# Patient Record
Sex: Female | Born: 1940 | Race: White | Hispanic: No | Smoking: Former smoker
Health system: Southern US, Community
[De-identification: ages and names within clinical notes are randomized; demographics above are authoritative.]

## PROBLEM LIST (undated history)

## (undated) DIAGNOSIS — M797 Fibromyalgia: Secondary | ICD-10-CM

## (undated) DIAGNOSIS — M1990 Unspecified osteoarthritis, unspecified site: Secondary | ICD-10-CM

## (undated) DIAGNOSIS — R519 Headache, unspecified: Secondary | ICD-10-CM

## (undated) DIAGNOSIS — Z9889 Other specified postprocedural states: Secondary | ICD-10-CM

## (undated) DIAGNOSIS — Z90710 Acquired absence of both cervix and uterus: Secondary | ICD-10-CM

## (undated) DIAGNOSIS — I2510 Atherosclerotic heart disease of native coronary artery without angina pectoris: Secondary | ICD-10-CM

## (undated) DIAGNOSIS — M719 Bursopathy, unspecified: Secondary | ICD-10-CM

## (undated) DIAGNOSIS — R112 Nausea with vomiting, unspecified: Secondary | ICD-10-CM

## (undated) DIAGNOSIS — R51 Headache: Secondary | ICD-10-CM

## (undated) DIAGNOSIS — I10 Essential (primary) hypertension: Secondary | ICD-10-CM

## (undated) DIAGNOSIS — J189 Pneumonia, unspecified organism: Secondary | ICD-10-CM

## (undated) DIAGNOSIS — E785 Hyperlipidemia, unspecified: Secondary | ICD-10-CM

## (undated) DIAGNOSIS — Z9289 Personal history of other medical treatment: Secondary | ICD-10-CM

## (undated) DIAGNOSIS — R42 Dizziness and giddiness: Secondary | ICD-10-CM

## (undated) DIAGNOSIS — M069 Rheumatoid arthritis, unspecified: Secondary | ICD-10-CM

## (undated) DIAGNOSIS — I219 Acute myocardial infarction, unspecified: Secondary | ICD-10-CM

## (undated) DIAGNOSIS — J45909 Unspecified asthma, uncomplicated: Secondary | ICD-10-CM

## (undated) HISTORY — PX: CORONARY ANGIOPLASTY: SHX604

## (undated) HISTORY — DX: Atherosclerotic heart disease of native coronary artery without angina pectoris: I25.10

## (undated) HISTORY — PX: ANGIOPLASTY: SHX39

## (undated) HISTORY — PX: EYE SURGERY: SHX253

## (undated) HISTORY — DX: Rheumatoid arthritis, unspecified: M06.9

## (undated) HISTORY — PX: BREAST SURGERY: SHX581

## (undated) HISTORY — DX: Hyperlipidemia, unspecified: E78.5

## (undated) HISTORY — DX: Essential (primary) hypertension: I10

## (undated) HISTORY — PX: HERNIA REPAIR: SHX51

## (undated) HISTORY — DX: Personal history of other medical treatment: Z92.89

## (undated) HISTORY — PX: OTHER SURGICAL HISTORY: SHX169

## (undated) HISTORY — PX: JOINT REPLACEMENT: SHX530

## (undated) HISTORY — PX: ABDOMINAL HYSTERECTOMY: SHX81

## (undated) HISTORY — DX: Acquired absence of both cervix and uterus: Z90.710

## (undated) HISTORY — PX: TONSILLECTOMY: SUR1361

---

## 1999-08-18 ENCOUNTER — Ambulatory Visit (HOSPITAL_COMMUNITY): Admission: RE | Admit: 1999-08-18 | Discharge: 1999-08-18 | Payer: Self-pay | Admitting: Internal Medicine

## 1999-08-18 ENCOUNTER — Encounter: Payer: Self-pay | Admitting: Internal Medicine

## 1999-10-07 ENCOUNTER — Encounter: Payer: Self-pay | Admitting: Gynecology

## 1999-10-07 ENCOUNTER — Encounter: Admission: RE | Admit: 1999-10-07 | Discharge: 1999-10-07 | Payer: Self-pay | Admitting: Gynecology

## 1999-10-13 ENCOUNTER — Encounter: Admission: RE | Admit: 1999-10-13 | Discharge: 1999-10-13 | Payer: Self-pay | Admitting: Gynecology

## 1999-10-13 ENCOUNTER — Encounter: Payer: Self-pay | Admitting: Gynecology

## 2000-09-27 ENCOUNTER — Ambulatory Visit (HOSPITAL_COMMUNITY): Admission: RE | Admit: 2000-09-27 | Discharge: 2000-09-27 | Payer: Self-pay | Admitting: Family Medicine

## 2000-09-27 ENCOUNTER — Encounter: Payer: Self-pay | Admitting: Family Medicine

## 2000-12-18 ENCOUNTER — Encounter: Admission: RE | Admit: 2000-12-18 | Discharge: 2000-12-18 | Payer: Self-pay

## 2000-12-20 ENCOUNTER — Encounter: Payer: Self-pay | Admitting: Gynecology

## 2000-12-20 ENCOUNTER — Encounter: Admission: RE | Admit: 2000-12-20 | Discharge: 2000-12-20 | Payer: Self-pay | Admitting: Gynecology

## 2002-02-11 ENCOUNTER — Encounter: Admission: RE | Admit: 2002-02-11 | Discharge: 2002-02-11 | Payer: Self-pay | Admitting: Gynecology

## 2002-02-11 ENCOUNTER — Encounter: Payer: Self-pay | Admitting: Gynecology

## 2002-02-20 ENCOUNTER — Other Ambulatory Visit: Admission: RE | Admit: 2002-02-20 | Discharge: 2002-02-20 | Payer: Self-pay | Admitting: Gynecology

## 2002-09-12 ENCOUNTER — Encounter: Payer: Self-pay | Admitting: Family Medicine

## 2002-09-12 ENCOUNTER — Ambulatory Visit (HOSPITAL_COMMUNITY): Admission: RE | Admit: 2002-09-12 | Discharge: 2002-09-12 | Payer: Self-pay | Admitting: Family Medicine

## 2003-03-20 ENCOUNTER — Encounter: Payer: Self-pay | Admitting: Gynecology

## 2003-03-20 ENCOUNTER — Encounter: Admission: RE | Admit: 2003-03-20 | Discharge: 2003-03-20 | Payer: Self-pay | Admitting: Gynecology

## 2003-03-23 ENCOUNTER — Other Ambulatory Visit: Admission: RE | Admit: 2003-03-23 | Discharge: 2003-03-23 | Payer: Self-pay | Admitting: Gynecology

## 2004-02-16 ENCOUNTER — Encounter: Admission: RE | Admit: 2004-02-16 | Discharge: 2004-02-16 | Payer: Self-pay

## 2004-04-25 ENCOUNTER — Encounter: Admission: RE | Admit: 2004-04-25 | Discharge: 2004-04-25 | Payer: Self-pay | Admitting: Gynecology

## 2004-05-30 ENCOUNTER — Other Ambulatory Visit: Admission: RE | Admit: 2004-05-30 | Discharge: 2004-05-30 | Payer: Self-pay | Admitting: Gynecology

## 2005-02-23 ENCOUNTER — Encounter: Payer: Self-pay | Admitting: Cardiovascular Disease

## 2005-02-23 ENCOUNTER — Encounter (HOSPITAL_COMMUNITY): Admission: RE | Admit: 2005-02-23 | Discharge: 2005-05-24 | Payer: Self-pay | Admitting: Cardiology

## 2005-05-25 ENCOUNTER — Encounter: Admission: RE | Admit: 2005-05-25 | Discharge: 2005-05-25 | Payer: Self-pay | Admitting: Gynecology

## 2005-06-01 ENCOUNTER — Ambulatory Visit: Payer: Self-pay | Admitting: Physical Medicine & Rehabilitation

## 2005-06-01 ENCOUNTER — Other Ambulatory Visit: Admission: RE | Admit: 2005-06-01 | Discharge: 2005-06-01 | Payer: Self-pay | Admitting: Gynecology

## 2005-06-01 ENCOUNTER — Inpatient Hospital Stay (HOSPITAL_COMMUNITY): Admission: EM | Admit: 2005-06-01 | Discharge: 2005-06-19 | Payer: Self-pay | Admitting: Emergency Medicine

## 2005-06-19 ENCOUNTER — Inpatient Hospital Stay (HOSPITAL_COMMUNITY)
Admission: RE | Admit: 2005-06-19 | Discharge: 2005-07-06 | Payer: Self-pay | Admitting: Physical Medicine & Rehabilitation

## 2005-06-19 ENCOUNTER — Ambulatory Visit: Payer: Self-pay | Admitting: Physical Medicine & Rehabilitation

## 2006-02-19 ENCOUNTER — Encounter: Admission: RE | Admit: 2006-02-19 | Discharge: 2006-02-19 | Payer: Self-pay | Admitting: Orthopedic Surgery

## 2006-03-13 ENCOUNTER — Ambulatory Visit (HOSPITAL_COMMUNITY): Admission: RE | Admit: 2006-03-13 | Discharge: 2006-03-13 | Payer: Self-pay | Admitting: Orthopedic Surgery

## 2006-05-31 ENCOUNTER — Encounter: Admission: RE | Admit: 2006-05-31 | Discharge: 2006-05-31 | Payer: Self-pay | Admitting: Gynecology

## 2006-06-07 ENCOUNTER — Other Ambulatory Visit: Admission: RE | Admit: 2006-06-07 | Discharge: 2006-06-07 | Payer: Self-pay | Admitting: Gynecology

## 2006-09-26 ENCOUNTER — Encounter: Admission: RE | Admit: 2006-09-26 | Discharge: 2006-12-25 | Payer: Self-pay | Admitting: Family Medicine

## 2006-11-22 ENCOUNTER — Other Ambulatory Visit: Admission: RE | Admit: 2006-11-22 | Discharge: 2006-11-22 | Payer: Self-pay | Admitting: Gynecology

## 2007-06-03 ENCOUNTER — Encounter: Admission: RE | Admit: 2007-06-03 | Discharge: 2007-06-03 | Payer: Self-pay | Admitting: Gynecology

## 2007-06-11 ENCOUNTER — Other Ambulatory Visit: Admission: RE | Admit: 2007-06-11 | Discharge: 2007-06-11 | Payer: Self-pay | Admitting: Gynecology

## 2007-12-30 ENCOUNTER — Encounter: Payer: Self-pay | Admitting: Cardiovascular Disease

## 2008-06-24 ENCOUNTER — Encounter: Admission: RE | Admit: 2008-06-24 | Discharge: 2008-06-24 | Payer: Self-pay | Admitting: Gynecology

## 2008-09-08 ENCOUNTER — Encounter: Admission: RE | Admit: 2008-09-08 | Discharge: 2008-09-08 | Payer: Self-pay | Admitting: Otolaryngology

## 2009-01-21 ENCOUNTER — Encounter: Payer: Self-pay | Admitting: Cardiovascular Disease

## 2009-06-09 ENCOUNTER — Encounter: Payer: Self-pay | Admitting: Cardiovascular Disease

## 2009-06-28 ENCOUNTER — Encounter: Admission: RE | Admit: 2009-06-28 | Discharge: 2009-06-28 | Payer: Self-pay | Admitting: Gynecology

## 2009-12-16 ENCOUNTER — Encounter: Payer: Self-pay | Admitting: Cardiovascular Disease

## 2009-12-29 ENCOUNTER — Encounter: Payer: Self-pay | Admitting: Cardiovascular Disease

## 2010-06-16 ENCOUNTER — Ambulatory Visit: Payer: Self-pay | Admitting: Cardiovascular Disease

## 2010-06-16 DIAGNOSIS — I252 Old myocardial infarction: Secondary | ICD-10-CM | POA: Insufficient documentation

## 2010-06-16 DIAGNOSIS — E7800 Pure hypercholesterolemia, unspecified: Secondary | ICD-10-CM | POA: Insufficient documentation

## 2010-06-24 ENCOUNTER — Encounter: Payer: Self-pay | Admitting: Cardiovascular Disease

## 2010-07-01 ENCOUNTER — Encounter: Payer: Self-pay | Admitting: Cardiovascular Disease

## 2010-07-04 ENCOUNTER — Encounter: Payer: Self-pay | Admitting: Cardiovascular Disease

## 2010-07-27 ENCOUNTER — Encounter: Admission: RE | Admit: 2010-07-27 | Discharge: 2010-07-27 | Payer: Self-pay | Admitting: Gynecology

## 2010-10-20 NOTE — Progress Notes (Signed)
Summary: [facility] Office Visit Note   [facility] Office Visit Note   Imported By: Roderic Ovens 07/26/2010 10:16:59  _____________________________________________________________________  External Attachment:    Type:   Image     Comment:   External Document

## 2010-10-20 NOTE — Progress Notes (Signed)
Summary: [facility] Office Visit Note   [facility] Office Visit Note   Imported By: Roderic Ovens 07/26/2010 10:21:23  _____________________________________________________________________  External Attachment:    Type:   Image     Comment:   External Document

## 2010-10-20 NOTE — Letter (Signed)
Summary: [facility] Images, Exercise Stress Test   [facility] Images, Exercise Stress Test   Imported By: Roderic Ovens 07/26/2010 10:33:09  _____________________________________________________________________  External Attachment:    Type:   Image     Comment:   External Document

## 2010-10-20 NOTE — Letter (Signed)
Summary: [facility] Extended Visit Note   [facility] Extended Visit Note   Imported By: Roderic Ovens 07/29/2010 16:12:13  _____________________________________________________________________  External Attachment:    Type:   Image     Comment:   External Document
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
## 2010-10-20 NOTE — Progress Notes (Signed)
Summary: [facility] Office Visit Note   [facility] Office Visit Note   Imported By: Roderic Ovens 07/26/2010 10:20:29  _____________________________________________________________________  External Attachment:    Type:   Image     Comment:   External Document

## 2010-11-24 IMAGING — MG MM DIGITAL SCREENING
4 series · 4 of 4 positions shown · non-contrast
Comparison: none

DG SCREEN MAMMOGRAM BILATERAL
Bilateral CC and MLO view(s) were taken.
Technologist: Klever Jumper

DIGITAL SCREENING MAMMOGRAM WITH CAD:
The breast tissue is almost entirely fatty.  No masses or malignant type calcifications are 
identified.  Compared with prior studies.
Images were processed with CAD.

[R CC]
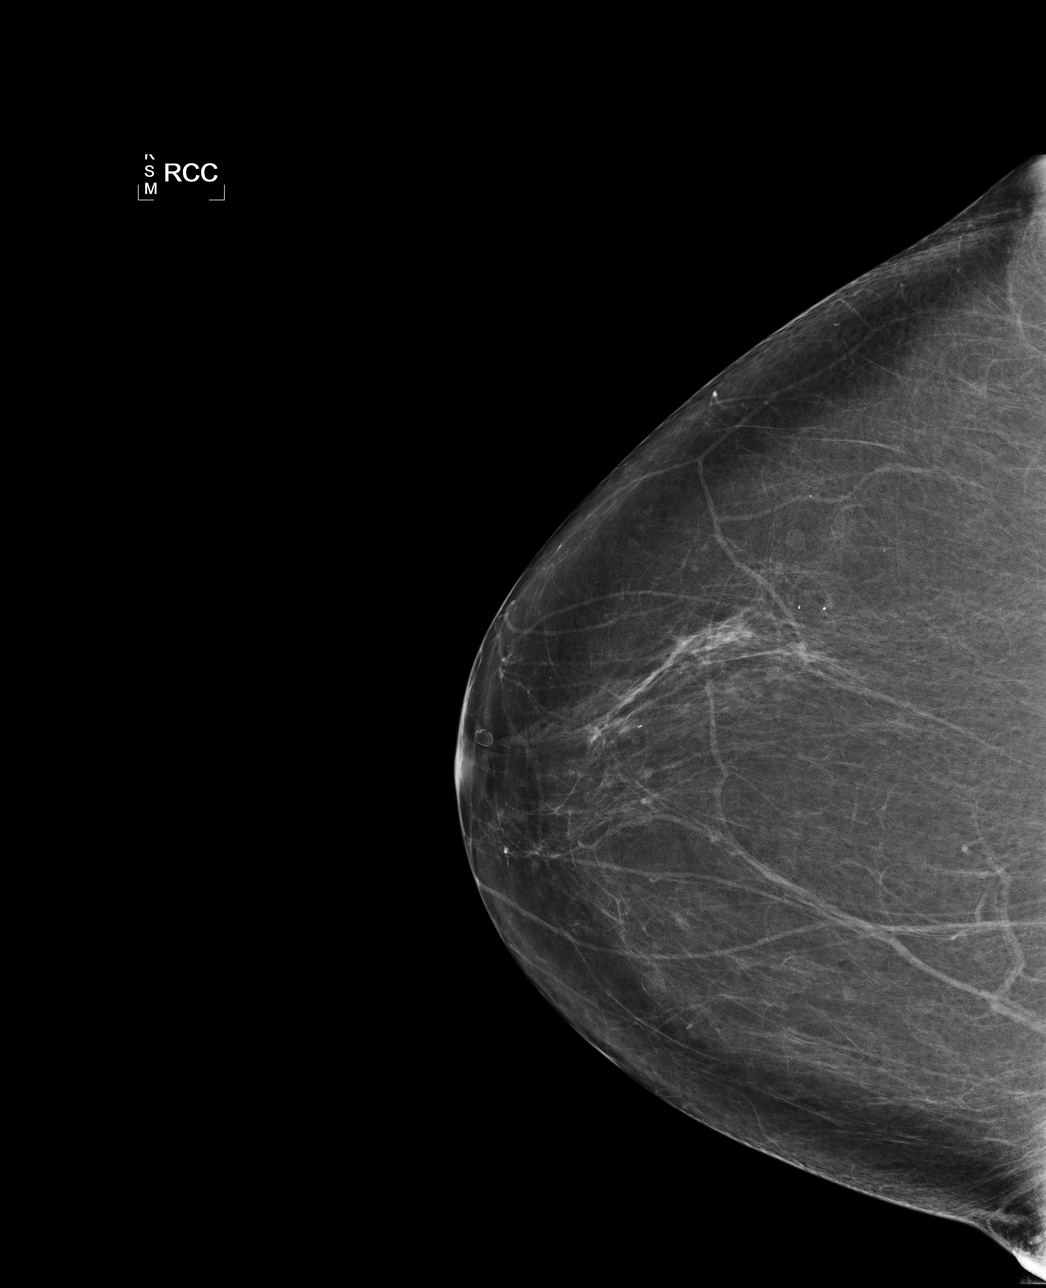

[L CC]
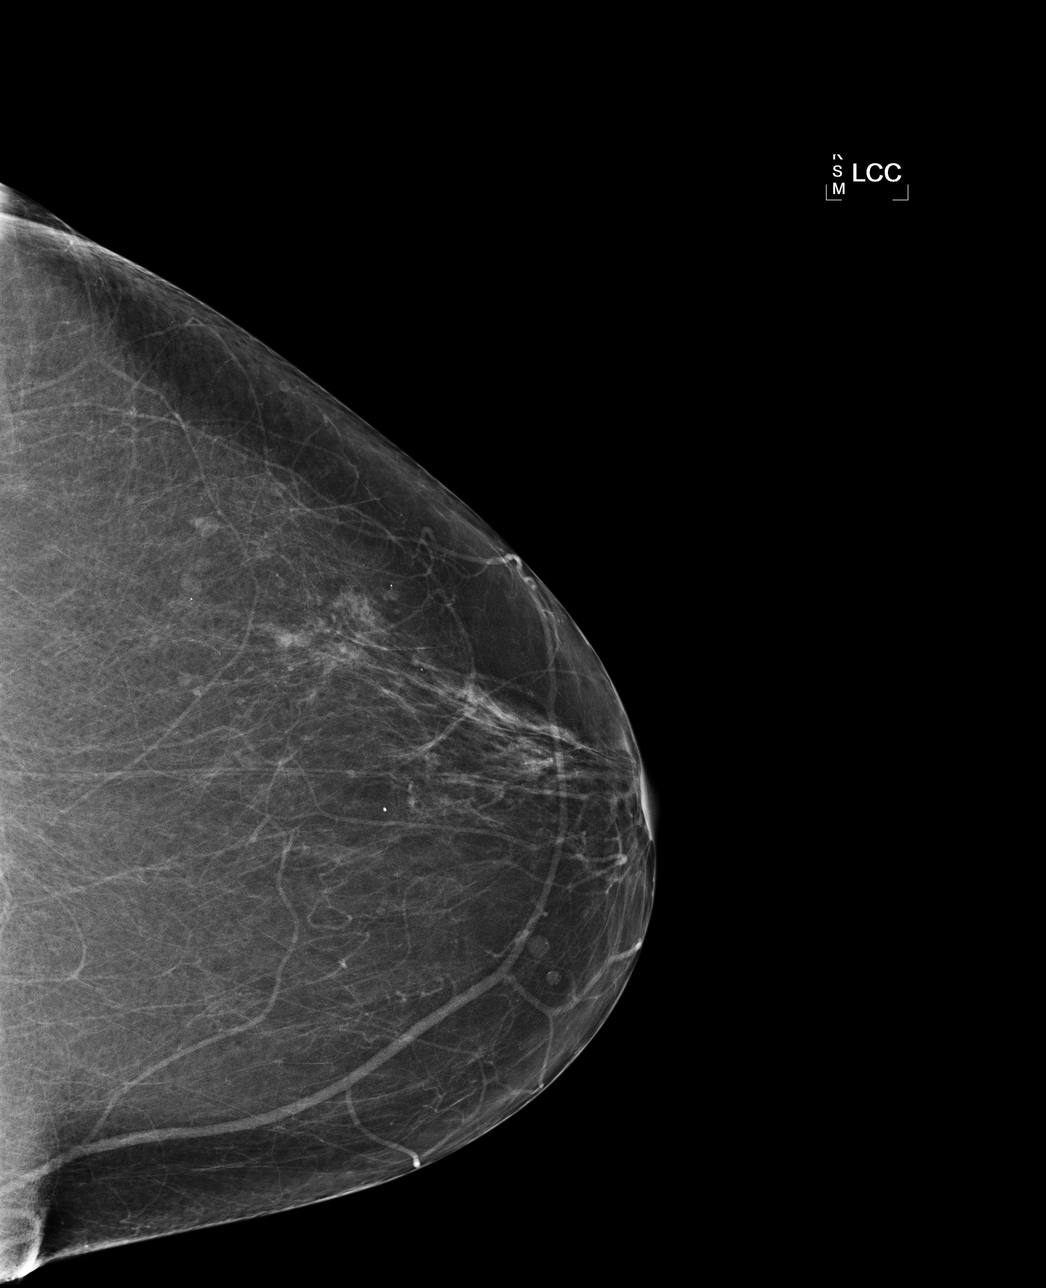

[L MLO]
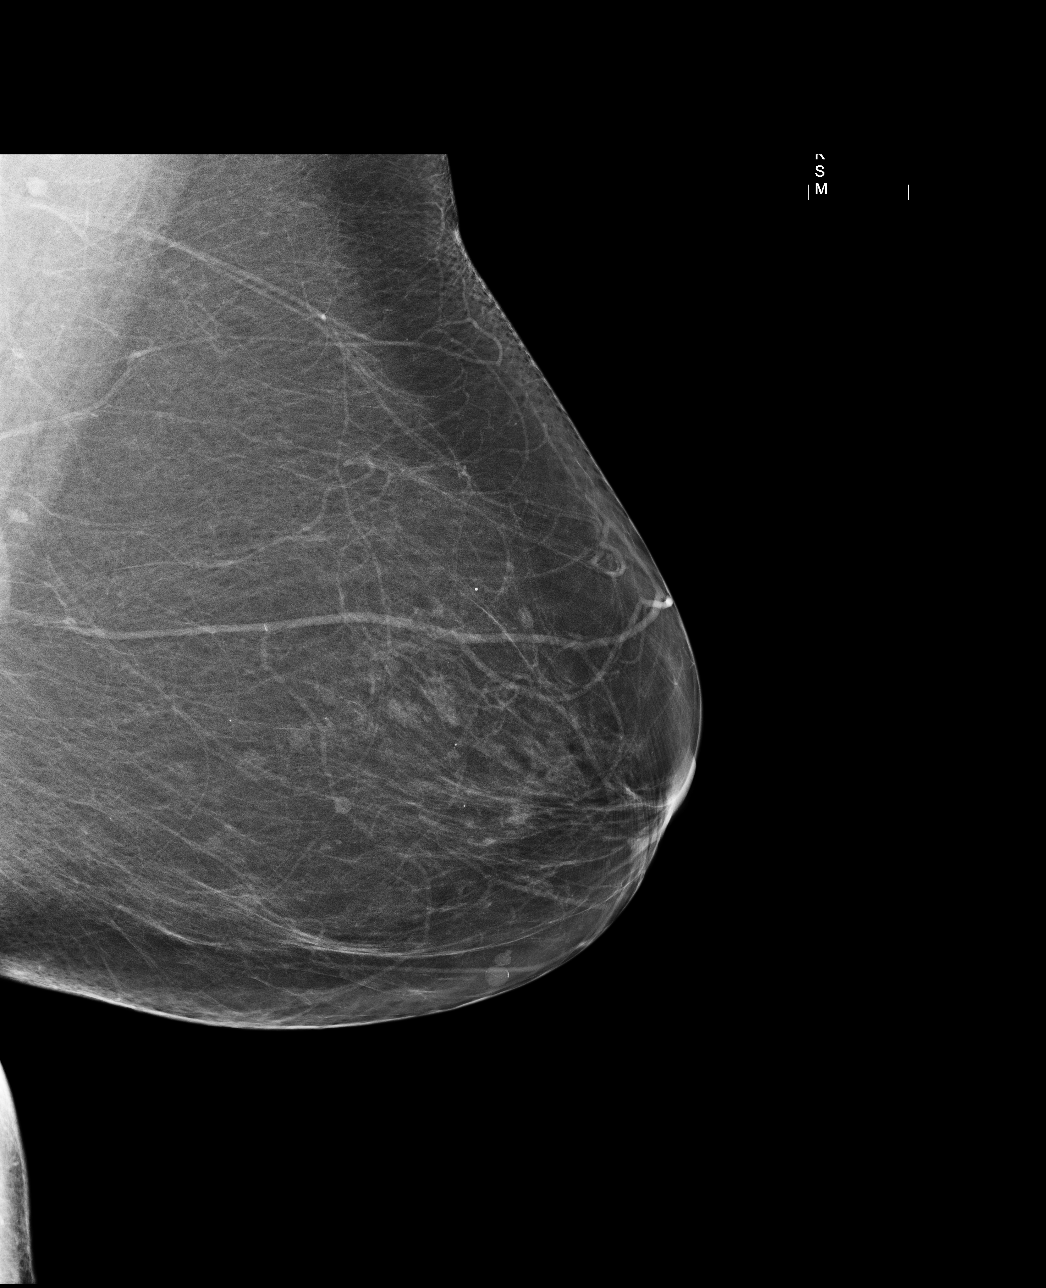

[R MLO]
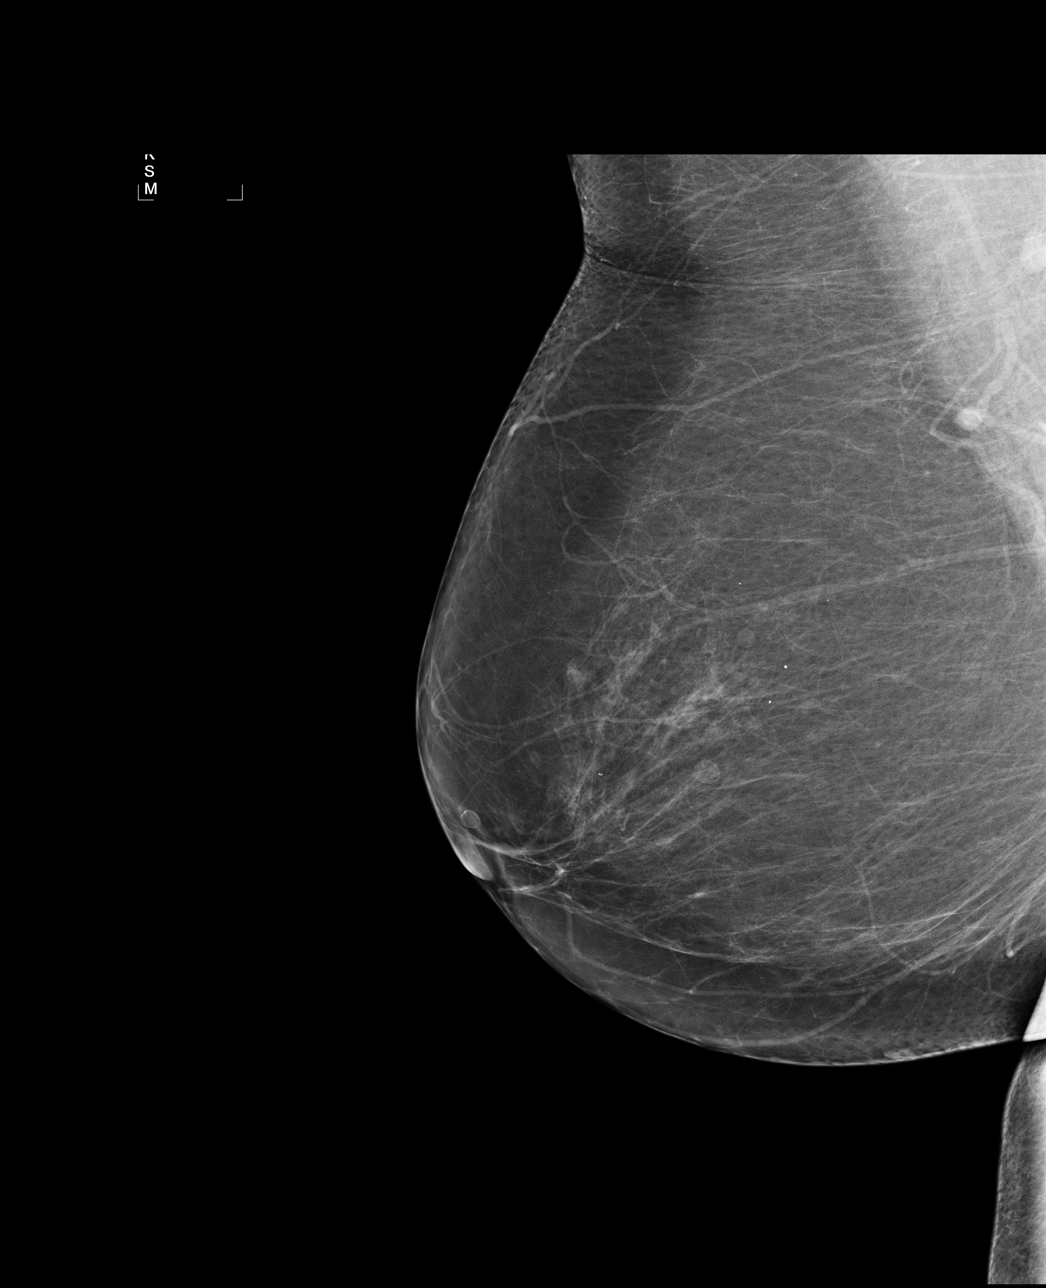

[4 of 4 positions shown; findings below may reference images not displayed]

IMPRESSION: No specific mammographic evidence of malignancy.  Next screening mammogram is recommended in one 
year.

A result letter of this screening mammogram will be mailed directly to the patient.

ASSESSMENT: Negative - BI-RADS 1

Screening mammogram in 1 year.
,

## 2010-12-29 ENCOUNTER — Encounter (HOSPITAL_COMMUNITY)
Admission: RE | Admit: 2010-12-29 | Discharge: 2010-12-29 | Disposition: A | Discharge status: Home / Self Care | Payer: Medicare Other | Source: Ambulatory Visit | Attending: Orthopedic Surgery | Admitting: Orthopedic Surgery

## 2010-12-29 LAB — BASIC METABOLIC PANEL
CO2: 28 mEq/L (ref 19–32)
Creatinine, Ser: 0.83 mg/dL (ref 0.4–1.2)
GFR calc non Af Amer: >60 mL/min (ref >60)
Glucose, Bld: 93 mg/dL (ref 70–99)
Potassium: 4.6 mEq/L (ref 3.5–5.1)
Sodium: 141 mEq/L (ref 135–145)

## 2010-12-29 LAB — CBC
HCT: 42.5 % (ref 36.0–46.0)
Hemoglobin: 14.4 g/dL (ref 12.0–15.0)
Platelets: 217 10*3/uL (ref 150–400)
RBC: 4.52 MIL/uL (ref 3.87–5.11)
RDW: 12.9 % (ref 11.5–15.5)

## 2010-12-29 LAB — SURGICAL PCR SCREEN: MRSA, PCR: NEGATIVE

## 2011-01-09 ENCOUNTER — Other Ambulatory Visit (HOSPITAL_COMMUNITY): Payer: Self-pay | Admitting: Orthopedic Surgery

## 2011-01-09 ENCOUNTER — Ambulatory Visit (HOSPITAL_COMMUNITY)
Admission: RE | Admit: 2011-01-09 | Discharge: 2011-01-09 | Disposition: A | Discharge status: Home / Self Care | Payer: Medicare Other | Source: Ambulatory Visit | Attending: Orthopedic Surgery | Admitting: Orthopedic Surgery

## 2011-01-09 ENCOUNTER — Observation Stay (HOSPITAL_COMMUNITY)
Admission: RE | Admit: 2011-01-09 | Discharge: 2011-01-12 | Disposition: A | Discharge status: Home / Self Care | Payer: Medicare Other | Source: Ambulatory Visit | Attending: Orthopedic Surgery | Admitting: Orthopedic Surgery

## 2011-01-09 DIAGNOSIS — I2510 Atherosclerotic heart disease of native coronary artery without angina pectoris: Secondary | ICD-10-CM | POA: Insufficient documentation

## 2011-01-09 DIAGNOSIS — J45909 Unspecified asthma, uncomplicated: Secondary | ICD-10-CM | POA: Insufficient documentation

## 2011-01-09 DIAGNOSIS — R339 Retention of urine, unspecified: Secondary | ICD-10-CM | POA: Insufficient documentation

## 2011-01-09 DIAGNOSIS — Z01812 Encounter for preprocedural laboratory examination: Secondary | ICD-10-CM | POA: Insufficient documentation

## 2011-01-09 DIAGNOSIS — Z472 Encounter for removal of internal fixation device: Principal | ICD-10-CM | POA: Insufficient documentation

## 2011-01-09 DIAGNOSIS — M1710 Unilateral primary osteoarthritis, unspecified knee: Secondary | ICD-10-CM | POA: Insufficient documentation

## 2011-01-09 DIAGNOSIS — B741 Filariasis due to Brugia malayi: Secondary | ICD-10-CM | POA: Insufficient documentation

## 2011-01-09 LAB — GRAM STAIN: Gram Stain: NONE SEEN

## 2011-01-10 LAB — URINALYSIS, MICROSCOPIC ONLY
Bilirubin Urine: NEGATIVE
Glucose, UA: NEGATIVE mg/dL
Hgb urine dipstick: NEGATIVE
Specific Gravity, Urine: 1.009 (ref 1.005–1.030)
Urobilinogen, UA: 0.2 mg/dL (ref 0.0–1.0)
pH: 7.5 (ref 5.0–8.0)

## 2011-01-11 LAB — URINE CULTURE

## 2011-01-12 LAB — WOUND CULTURE
Culture: NO GROWTH
Gram Stain: NONE SEEN

## 2011-01-14 LAB — ANAEROBIC CULTURE: Gram Stain: NONE SEEN
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
## 2011-01-20 NOTE — Discharge Summary (Signed)
NAME:  Andrea Ibarra, SCHUM NO.:  [id number]  MEDICAL RECORD NO.:  [id number]           PATIENT TYPE:  LOCATION:                                 FACILITY:  PHYSICIAN:  Doralee Albino. Carola Frost, M.D. DATE OF BIRTH:  1940/12/15  DATE OF ADMISSION:  01/09/2011 DATE OF DISCHARGE:  01/12/2011                              DISCHARGE SUMMARY   DISCHARGE DIAGNOSES: 1. Symptomatic hardware, right distal femur. 2. Right knee end-stage degenerative joint disease.  ADDITIONAL DISCHARGE DIAGNOSES: 1. Asthma. 2. Coronary artery disease. 3. History of deep venous thrombosis. 4. History of pancreatitis.  PROCEDURES PERFORMED:  On January 09, 2011, removal of deep implant right femur.  BRIEF HISTORY AND HOSPITAL COURSE:  Ms. Putnam is a very pleasant 70- year-old Caucasian female with history of a right distal femur fracture that was treated by ORIF.  She has progressed to end-stage DJD and is getting ready for total knee arthroplasty.  As such, we required to remove her hardware so that her total knee can be placed in.  Ms. Mostek was brought to the operating room on January 09, 2011.  She tolerated the procedure very well.  She was transferred to the orthopedic floor for continued observation and pain control.  Postoperative day #1, the patient was complaining of profound nausea and bladder fullness.  She really did not work with physical therapy at all on postoperative day #1 secondary to her lethargy.  No additional findings were noted on her examination.  We did have the patient in the cast which ultimately removed around 700 mL of urine.  This was sent down for evaluation which was ultimately negative.  On postoperative day #2, the patient was doing somewhat better but still was complaining of a headache.  She did work with physical therapy a little bit but was still requiring decent amount of assistance to facilitate mobilization.  On postoperative day #3, the patient turned  dramatic corner and was feeling fantastic.  She requests to go home today with some home health therapy.  Clinical encounter note for postop day #3 was follows.  SUBJECTIVE:  The patient was doing fantastic, no issues, ready to go home.  OBJECTIVE:  VITAL SIGNS:  Temperature 98.1, heart rate 74, respirations 18 and 95% on room air, BP is 129/78. GENERAL: No acute distress, appears well. CARDIAC:  S1 and S2 noted. ABDOMEN:  Soft, nontender with positive bowel sounds. LUNGS:  Clear. EXTREMITIES:  Right lower extremity incisions is stable.  There is scant drainage.  No signs of infection.  Deep peroneal nerve, superficial peroneal nerve, and tibial nerve sensory functions are intact.  EHL, FHL, anterior tibialis, posterior tibialis, peroneals, gastroc-soleus complex, motor function are intact.  Quadriceps and hamstring motor function intact as well as.  No deep calf tenderness.  Compartments are soft and nontender.  ASSESSMENT/PLAN:  A 70 year old female status post removal of hardware, right femur. 1. Removal of hardware, right femur with end-stage degenerative joint     disease, weight bear as tolerated.  Home health PT dressing changes     as needed. 2. Urinary retention resolved. 3. Headache  resolved. 4. Asthma and coronary artery disease are stable. 5. Deep venous thrombosis, pulmonary embolus prophylaxis:  SCDs.  DISPOSITION:  Home today with home health PT.  Follow up with Orthopedics in 10-14 days.  DISCHARGE MEDICATIONS:  Vicodin 5/500, 1-2 p.o. q.6 h. as needed for pain.  The patient can resume her home medications as follows: 1. Tylenol Extra Strength 500 mg 1 p.o. q.6 h. as needed not to exceed     4000 mg in combination with Vicodin. 2. Zyrtec 10 mg 1 p.o. daily. 3. Ibuprofen 200 mg 2 p.o. daily as needed. 4. Aspirin 81 mg 1 p.o. daily. 5. Trazodone 25 mg 1 p.o. daily. 6. Calcium citrate with vitamin D over-the-counter 1 p.o. daily. 7. Crestor 10 mg 1 p.o.  daily. 8. Multivitamins 1 p.o. daily. 9. Methotrexate 2.5 mg 4 tablets p.o. weekly.  DISCHARGE INSTRUCTIONS AND PLANS:  Ms. Kavan should recover fairly quickly from this surgery.  I ultimately anticipate her only needing a walker for a brief period of time and then she will be weightbearing as tolerated on her right leg.  After her wound is completely healed, we will then revert her care back to the total joint surgeon to schedule for total knee arthroplasty.  The patient will follow up with Orthopedics in 10 days for reevaluation and hardware and staple removal. She does not require any DVT prophylaxis as she is very mobile at this current time.  We will continue to monitor her very closely.  Should she see have any questions prior to follow up, she is encouraged to contact the office at [phone].     Mearl Latin, PA   ______________________________ Doralee Albino. Carola Frost, M.D.    KWP/MEDQ  D:  01/12/2011  T:  01/12/2011  Job:  [id number]  Electronically Signed by Montez Morita PA on 01/13/2011 10:12:35 AM Electronically Signed by Myrene Galas M.D. on 01/20/2011 07:54:04 AM
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
## 2011-02-03 NOTE — Op Note (Signed)
NAME:  Andrea Ibarra, Andrea Ibarra                 ACCOUNT NO.:  [id number]   MEDICAL RECORD NO.:  [id number]          PATIENT TYPE:  AMB   LOCATION:  SDS                          FACILITY:  [facility]   PHYSICIAN:  Doralee Albino. Carola Frost, M.D. DATE OF BIRTH:  19-Nov-1940   DATE OF PROCEDURE:  03/13/2006  DATE OF DISCHARGE:                                 OPERATIVE REPORT   PREOPERATIVE DIAGNOSES:  1.  Right knee patellofemoral arthritis, posttraumatic.  2.  Symptomatic hardware.  3.  Symptomatic deep mass right patella.   POSTOPERATIVE DIAGNOSES:  1.  Right knee patellofemoral arthritis, posttraumatic.  2.  Probable asymptomatic hardware  3.  Symptomatic deep suture mass right patella.   PROCEDURES:  1.  Right knee arthroscopic chondroplasty of the patellofemoral joint.  2.  Partial synovectomy/debridement of scar tissue.  3.  Removal of deep implant, right femur.  4.  Removal of foreign body, deep right patella.  5.  Stress examination under flouro right knee MCL   SURGEON:  Doralee Albino. Carola Frost, M.D.   ASSISTANT:  Aura Fey. Bobbe Medico.   ANESTHESIA:  General.   COMPLICATIONS:  None.   TOURNIQUET:  None.   ESTIMATED BLOOD LOSS:  Less than 20 cc.   DISPOSITION:  To PACU.   CONDITION:  Stable.   BRIEF SUMMARY OF INDICATIONS AND PROCEDURE:  Andrea Ibarra is a 70 year old  female status post right distal femur ORIF with a significant interarticular  comminution. She did well subsequently but did develop progressive  patellofemoral pain which was unresponsive to injections. CT scan  demonstrated the possibility of patellar articulation with one of the screws  off the medial femoral condyle, as well as some degenerative changes which  were posttraumatic.  After discussion of the risks and benefits of surgery,  Andrea Ibarra elected to proceed with arthroscopic chondroplasty with  debridement and removal of hardware.  She did not wish to undergo complete  hardware removal at this time and was hoping to avoid  that in the future if  symptoms resolve sufficiently.  She understood the risk and complications of  surgery including the possibility that her symptoms would not significantly  improve or could recur. After full discussion, she wished to proceed.   BRIEF DESCRIPTION OF PROCEDURE:  Andrea Ibarra was administered preoperative  Ancef and taken to the operating room, where general anesthesia was induced.  Her right lower extremity was prepped and draped in the usual sterile  fashion.  Tourniquet was placed but never inflated during the procedure.  Standard inferolateral and inferomedial portals established, and full  diagnostic arthroscopy performed of the knee.  This demonstrated grade 3 and  4 changes of the patella involving most of the articular surface.  There  were no major grooves or other evidence of direct contact with hardware.  No  hardware was visible within the joint itself.  The articular reconstruction  of the distal femur appeared excellent, with no perceptible step-offs or  groove.  There were some grade 2 changes in the femoral trochlea consistent  with some mild fissuring, but no fibrillations were present.  The articular  surfaces on the weightbearing area of the femur were well preserved, without  any focal areas of arthritis, other than some generalized softening, and  similarly the tibial surfaces were well preserved.  The medial and lateral  meniscal cartilages were intact.  There was a small area of scar extending  off the imminence into the lateral compartment that was debrided.  There was  extensive scarring in the anterior compartment as anticipated, which was  also had a smaller inflammatory component to it that could be pinched in the  joint.  This area was debrided back to the point where it could not be  caught between the articular surfaces, and also this facilitated better  visualization of the medial femoral condyle. The head of the screw was  identified using  the probe and C-arm. It was off the articular surface and  did not appear able to directly contact the patella; however, as it was  identified and could be extracted percutaneously with arthroscopic  assistance, this was performed, and we made a small stab the incision after  using the spinal needle to establish correct trajectory, and through this we  inserted the curette which removed the soft tissue overlying the screw head  including some bony overgrowth, and then the screw was removed without  significant difficulty.  The shaver was used to debride any tissue that  loosened during extraction, and at that point the chondroplasty of the  patella was completed, including a large osteophyte off the inferior pole.  The instruments were then withdrawn from the knee.   At that time, we turned attention to the anterior surface of the patella  itself, where there was a palpable and tender ___________.  We expected this  to be bone growth from fracture healing.  We extended the lateral portal  more proximally and exposed the prominence. It was in fact a knot of  FiberWire suture.  This was delivered from the wound and completely excised.  This area was irrigated and closed in layered fashion with 2-0 Vicryl, and 3-  0 nylon was used for the portals and skin layer.  The knee was injected with  10 cc of Marcaine and epinephrine, and then a sterile gently compressive  dressing applied.  The patient was awakened from anesthesia and transported  to the PACU in stable condition.   PROGNOSIS:  Andrea Ibarra should have better function postoperatively.  The  patellofemoral arthritis would be expected to continue to progress, but with  removal of the synovitis component as well as the large osteophytes  mechanically, this should translate into improved function and facilitate her return to an arthritic PT program for the knee.  She will not require  formal DVT prophylaxis but will begin taking a baby  aspirin daily tomorrow,  and can begin immediate range of motion with weightbearing as tolerated.      Doralee Albino. Carola Frost, M.D.  Electronically Signed     MHH/MEDQ  D:  03/13/2006  T:  03/13/2006  Job:  [id number]

## 2011-03-06 ENCOUNTER — Other Ambulatory Visit: Payer: Self-pay | Admitting: Orthopedic Surgery

## 2011-03-06 ENCOUNTER — Encounter (HOSPITAL_COMMUNITY): Payer: Medicare Other

## 2011-03-06 LAB — PROTIME-INR
INR: 0.96 (ref 0.00–1.49)
Prothrombin Time: 13 s (ref 11.6–15.2)

## 2011-03-06 LAB — COMPREHENSIVE METABOLIC PANEL
AST: 24 U/L (ref 0–37)
Albumin: 4.2 g/dL (ref 3.5–5.2)
BUN: 14 mg/dL (ref 6–23)
Chloride: 103 mEq/L (ref 96–112)
Creatinine, Ser: 0.76 mg/dL (ref 0.50–1.10)
Potassium: 4.5 mEq/L (ref 3.5–5.1)
Total Bilirubin: 0.5 mg/dL (ref 0.3–1.2)
Total Protein: 7.3 g/dL (ref 6.0–8.3)

## 2011-03-06 LAB — CBC
MCHC: 33 g/dL (ref 30.0–36.0)
Platelets: 189 10*3/uL (ref 150–400)
RDW: 13.2 % (ref 11.5–15.5)
WBC: 8.9 10*3/uL (ref 4.0–10.5)

## 2011-03-06 LAB — URINALYSIS, ROUTINE W REFLEX MICROSCOPIC
Bilirubin Urine: NEGATIVE
Glucose, UA: NEGATIVE mg/dL
Hgb urine dipstick: NEGATIVE
Ketones, ur: NEGATIVE mg/dL
Leukocytes, UA: NEGATIVE
Nitrite: NEGATIVE
Protein, ur: NEGATIVE mg/dL
Specific Gravity, Urine: 1.01 (ref 1.005–1.030)
Urobilinogen, UA: 0.2 mg/dL (ref 0.0–1.0)
pH: 7 (ref 5.0–8.0)

## 2011-03-06 LAB — SURGICAL PCR SCREEN
MRSA, PCR: NEGATIVE
Staphylococcus aureus: NEGATIVE

## 2011-03-06 LAB — APTT: aPTT: 28 seconds (ref 24–37)

## 2011-03-13 ENCOUNTER — Inpatient Hospital Stay (HOSPITAL_COMMUNITY)
Admission: RE | Admit: 2011-03-13 | Discharge: 2011-03-17 | DRG: 470 | Disposition: A | Discharge status: Home Health | Payer: Medicare Other | Source: Ambulatory Visit | Attending: Orthopedic Surgery | Admitting: Orthopedic Surgery

## 2011-03-13 DIAGNOSIS — M069 Rheumatoid arthritis, unspecified: Secondary | ICD-10-CM | POA: Diagnosis present

## 2011-03-13 DIAGNOSIS — D649 Anemia, unspecified: Secondary | ICD-10-CM | POA: Diagnosis not present

## 2011-03-13 DIAGNOSIS — E7800 Pure hypercholesterolemia, unspecified: Secondary | ICD-10-CM | POA: Diagnosis present

## 2011-03-13 DIAGNOSIS — Z9861 Coronary angioplasty status: Secondary | ICD-10-CM

## 2011-03-13 DIAGNOSIS — I252 Old myocardial infarction: Secondary | ICD-10-CM

## 2011-03-13 DIAGNOSIS — Z01812 Encounter for preprocedural laboratory examination: Secondary | ICD-10-CM

## 2011-03-13 DIAGNOSIS — I2510 Atherosclerotic heart disease of native coronary artery without angina pectoris: Secondary | ICD-10-CM | POA: Diagnosis present

## 2011-03-13 DIAGNOSIS — M12569 Traumatic arthropathy, unspecified knee: Principal | ICD-10-CM | POA: Diagnosis present

## 2011-03-13 LAB — TYPE AND SCREEN
ABO/RH(D): AB POS
Antibody Screen: NEGATIVE

## 2011-03-13 NOTE — Op Note (Signed)
NAME:  Andrea Ibarra, Andrea Ibarra NO.:  [id number]  MEDICAL RECORD NO.:  [id number]  LOCATION:  0005                         FACILITY:  [facility]  PHYSICIAN:  Ollen Gross, M.D.    DATE OF BIRTH:  1940/10/07  DATE OF PROCEDURE:  03/13/2011 DATE OF DISCHARGE:                              OPERATIVE REPORT   PREOPERATIVE DIAGNOSIS:  Post-traumatic osteoarthritis, right knee.  POSTOPERATIVE DIAGNOSIS:  Post-traumatic osteoarthritis, right knee.  PROCEDURE:  Right total knee arthroplasty.  SURGEON:  Ollen Gross, M.D.  ASSISTANT:  Alexzandrew L. Perkins, P.A.C.  ANESTHESIA:  General.  ESTIMATED BLOOD LOSS:  Minimal.  DRAIN:  Hemovac x1.  TOURNIQUET TIME:  56 minutes at 300 mmHg.  COMPLICATIONS:  None.  CONDITION:  Stable to recovery.  BRIEF CLINICAL NOTE:  Andrea Ibarra is a 70 year old female who had a significant distal femur fracture, treated by Dr. Myrene Galas with open reduction and internal fixation.  She eventually healed this but has developed post-traumatic arthritis of lateral and patellofemoral compartments.  This has been nonresponsive to nonoperative management. She has recently had her hardware removed by Dr. Carola Frost and presents now for total knee arthroplasty.  PROCEDURE IN DETAIL:  After successful administration of the general anesthetic, a tourniquet placed high on her right thigh and her right lower extremity prepped and draped in the usual sterile fashion. Extremity was wrapped in Esmarch, knee flexed, tourniquet inflated to 300 mmHg.  Midline incision was made with 10 blade through subcutaneous tissue to the level of the extensor mechanism.  A fresh blade is used make a medial parapatellar arthrotomy.  Soft tissue on the proximal medial tibia subperiosteally elevated to the joint line with the knife into the semimembranosus bursa with a Cobb elevator.  Soft tissue laterally is elevated with attention being paid to avoid patellar tendon on  tibial tubercle.  The patella was everted, knee flexed to 90 degrees and ACL and PCL removed.  Drill was used to create a starting hole in the distal femur and the canal was thoroughly irrigated.  We did our intramedullary reaming up to 14 mm.  I decided to place a stem due to her previous distal femur fracture to help bypass the fracture site.  I reamed up to 14, left the reamer in place and then placed our distal femoral cutting block.  It is in 5-degree right valgus position.  Block was pinned to removed 10 mm off the distal femur.  Resection was made with an oscillating saw.  We encountered one of the anterior to posterior screws and removed that.  The tibia was then subluxed forward and the menisci removed. Extramedullary tibial alignment guide was placed, referencing proximally off the medial aspect of the tibial tubercle and distally around the second metatarsal axis of the tibial crest.  Block is pinned to remove 2 mm from the more deficient lateral side.  Tibial resection was made with an oscillating saw.  Size 2 is the most appropriate tibial component with the preparation of an MBT revision tray.  The modular drill and keel punch are utilized.  Went back to the femoral size, sizing guide was also demonstrating it too.  The AP  cutting block was placed.  Rotation was marked off the epicondylar axis and confirmed by creating rectangular flexion gap at 90 degrees.  We placed in a +2 position and that is what allowed the anterior and posterior femur to be closest to the prosthesis.  The anterior and posterior cuts were made.  The size 2 was the most appropriate femoral component.  The intercondylar block is placed and chamfer and intercondylar cut is made.  The trial femur was placed which is 14 x 115 stem in a +2 position, 5-degree right valgus with a size 2 TC3 femur with 4-mm posterior augments to help bring the implant down to the anterior cortex of the femur.  With this trial  in place, we had excellent fit.  A tibial trial with a size 2 MBT revision tray was also placed and up to a 15-mm insert which allowed for full extension with excellent varus-valgus and anterior-posterior balance throughout.  Full range of motion.  The patella was then everted and the thickness measured to be 22 mm.  Freehand resection is taken to 12 mm, 32 template is placed, lug holes are drilled, trial patella was placed and it tracks normally.  Osteophytes removed off the posterior femur with a trial in place.  All trials were removed and the cut bone surfaces prepared with pulsatile lavage.  Cement was mixed and once ready for implantation, size 2 MBT revision tray, size 2 TC3 femur with 4-mm posterior augments medial and lateral and with a 14 x 115 stem extension in +2 and 5- degrees right valgus position.  This was cemented distally Press-Fit at the stem.  All extruded cement removed.  Trial 15 inserts placed, knee held in full extension.  Any further extruded cement removed.  The 32 patella was also cemented in place, held with a clamp.  Trial 15 inserts placed and once the cement fully hardened, the permanent 15-mm RPTC3 insert was placed.  Wound was copiously irrigated with saline solution and the arthrotomy closed over Hemovac drain with the subcuticular 1 PDS.  Flexion against gravity to 135, patella tracks normally. Tourniquet release total time of 56 minutes.  The subcu is then closed with interrupted 2-0 Vicryl and subcuticular running 4-0 Monocryl. Catheter for Marcaine pain pump was placed and pumps initiated. Incisions cleaned and dried.  Steri-Strips and a bulky sterile dressing were applied.  She is then placed into a knee immobilizer, awakened and transported to recovery in stable condition.     Ollen Gross, M.D.     FA/MEDQ  D:  03/13/2011  T:  03/13/2011  Job:  [id number]  Electronically Signed by Ollen Gross M.D. on 03/13/2011 05:03:31 PM

## 2011-03-14 LAB — BASIC METABOLIC PANEL
Calcium: 8.7 mg/dL (ref 8.4–10.5)
GFR calc Af Amer: >60 mL/min (ref >60)
GFR calc non Af Amer: >60 mL/min (ref >60)
Potassium: 4.3 mEq/L (ref 3.5–5.1)
Sodium: 137 mEq/L (ref 135–145)

## 2011-03-14 LAB — CBC
Hemoglobin: 10.2 g/dL — ABNORMAL LOW (ref 12.0–15.0)
MCH: 31.5 pg (ref 26.0–34.0)
MCHC: 33.6 g/dL (ref 30.0–36.0)
Platelets: 166 10*3/uL (ref 150–400)
RDW: 13.3 % (ref 11.5–15.5)

## 2011-03-15 LAB — BASIC METABOLIC PANEL
CO2: 24 mEq/L (ref 19–32)
Calcium: 8.5 mg/dL (ref 8.4–10.5)
Chloride: 105 mEq/L (ref 96–112)
Glucose, Bld: 171 mg/dL — ABNORMAL HIGH (ref 70–99)
Potassium: 3.7 mEq/L (ref 3.5–5.1)
Sodium: 137 mEq/L (ref 135–145)

## 2011-03-15 LAB — CBC
Hemoglobin: 9.1 g/dL — ABNORMAL LOW (ref 12.0–15.0)
MCH: 32.2 pg (ref 26.0–34.0)
Platelets: 142 10*3/uL — ABNORMAL LOW (ref 150–400)
RBC: 2.83 MIL/uL — ABNORMAL LOW (ref 3.87–5.11)
WBC: 9.8 10*3/uL (ref 4.0–10.5)

## 2011-03-16 LAB — CBC
Hemoglobin: 8.7 g/dL — ABNORMAL LOW (ref 12.0–15.0)
MCH: 31.4 pg (ref 26.0–34.0)
MCV: 94.2 fL (ref 78.0–100.0)
Platelets: 154 10*3/uL (ref 150–400)
RBC: 2.77 MIL/uL — ABNORMAL LOW (ref 3.87–5.11)
WBC: 7.8 10*3/uL (ref 4.0–10.5)

## 2011-03-17 LAB — CBC
HCT: 27.1 % — ABNORMAL LOW (ref 36.0–46.0)
Hemoglobin: 9.2 g/dL — ABNORMAL LOW (ref 12.0–15.0)
MCHC: 33.9 g/dL (ref 30.0–36.0)
MCV: 94.1 fL (ref 78.0–100.0)
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
## 2011-04-13 NOTE — Discharge Summary (Signed)
NAME:  Andrea Ibarra, Andrea Ibarra NO.:  [id number]  MEDICAL RECORD NO.:  [id number]  LOCATION:  1606                         FACILITY:  [facility]  PHYSICIAN:  Ollen Gross, M.D.    DATE OF BIRTH:  [birth year]  DATE OF ADMISSION:  03/13/2011 DATE OF DISCHARGE:  03/17/2011                              DISCHARGE SUMMARY   ADMITTING DIAGNOSES: 1. Osteoarthritis right knee. 2. Migraines. 3. History of shingles. 4. Depression. 5. Past history of asthma. 6. Past history of bronchitis. 7. Coronary artery disease. 8. History of myocardial infarction. 9. Hypercholesterolemia. 10.Past history of pancreatitis x2. 11.Osteopenia. 12.Osteoarthritis. 13.Childhood illnesses of measles, mumps, and rubella.  DISCHARGE DIAGNOSIS: 1. Osteoarthritis, right knee, post-traumatic status post right total     knee replacement arthroplasty. 2. Mild postop acute blood loss anemia, did not require transfusion. 3. Migraines. 4. History of shingles. 5. Depression. 6. Past history of asthma. 7. Past history of bronchitis. 8. Coronary artery disease. 9. History of myocardial infarction. 10.Hypercholesterolemia. 11.Past history of pancreatitis x2. 12.Osteopenia. 13.Osteoarthritis. 14.Childhood illnesses of measles, mumps, and rubella.  PROCEDURE:  Johnsie 25, 2012, right total knee.  Surgeon, Dr. Lequita Halt. Assistant, Alexzandrew L. Perkins, P.A.C.  Anesthesia, general. Tourniquet time, 56 minutes.  CONSULTS:  None.  BRIEF HISTORY:  The patient is a 70 year old female with a significant distal femur fracture treated by Dr. Myrene Galas with open reduction internal fixation, eventually healed with some post-traumatic arthritis, nonresponsive to nonoperative management, now presents for hardware removal and total knee arthroplasty.  LABORATORY DATA:  Admission CBC showed hemoglobin 13.9.  Serial CBCs were followed.  Hemoglobin dropped down to 10.2, then 8.7, back up to 9.2 with last noted  hematocrit of 27.1.  Chem panel on admission not scanned in the chart but serial BMETs were followed for 48 hours. Electrolytes remained within normal limits.  Blood group type A+.  HOSPITAL COURSE:  The patient admitted to [facility], taken to OR, underwent above-stated procedure without complication.  The patient tolerated the procedure well, later transferred to the recovery room on orthopedic floor, had fair amount of pain on morning of day #1.  Hemovac drain was pulled.  She had a past history of MI and heart disease.  We added sublingual as a precaution, started back on her home meds.  We held the methotrexate which was a rheumatoid med.  Started getting up out of bed on day #1.  By day #2, she had a little bit of intermittent nausea, so we watched that and made sure that resolved.  Hemoglobin was down to 9.1.  Placed on iron supplement.  Incision was checked. Dressing changed.  Incision looked good.  No signs of infection.  By day #3, she was started on progressive physical therapy but definitely had not met all the goals, so she was not ready to go home.  Nausea had improved.  Incision looked good.  Hemoglobins down to 8.7.  She was asymptomatic with this, so we did see how she did with therapy.  She was asymptomatic.  Continued to progress well and by the following day, she was moving her bowels. Pain was decreasing.  She was progressing with therapy and  discharged home.  DISCHARGE/PLAN: 1. The patient discharged home on [place] 29, 2012. 2. Discharge diagnoses, please see above. 3. Discharge meds:  Dilaudid, Robaxin, Xarelto, Nu-Iron.  Continue     Crestor, trazodone and Zyrtec.  DIET:  Heart-healthy diet.  ACTIVITY:  Weightbearing as tolerated.  Total knee protocol.  FOLLOW-UP:  Two weeks.  DISPOSITION:  Home.  CONDITION ON DISCHARGE:  Improving.     Alexzandrew L. Julien Girt, P.A.C.   ______________________________ Ollen Gross, M.D.    ALP/MEDQ  D:   04/06/2011  T:  04/06/2011  Job:  [id number]  cc:   Soyla Murphy. Renne Crigler, M.D. Fax: [phone]  Veverly Fells. Excell Seltzer, MD [address] [place], [place] [address]  Electronically Signed by Patrica Duel P.A.C. on 04/11/2011 78:29:56 AM Electronically Signed by Ollen Gross M.D. on 04/13/2011 03:30:28 PM

## 2011-06-07 ENCOUNTER — Encounter: Payer: Self-pay | Admitting: Cardiovascular Disease

## 2011-06-08 ENCOUNTER — Ambulatory Visit (INDEPENDENT_AMBULATORY_CARE_PROVIDER_SITE_OTHER): Payer: Medicare Other | Admitting: Cardiovascular Disease

## 2011-06-08 ENCOUNTER — Encounter: Payer: Self-pay | Admitting: Cardiovascular Disease

## 2011-06-08 DIAGNOSIS — I252 Old myocardial infarction: Secondary | ICD-10-CM

## 2011-06-08 DIAGNOSIS — E7800 Pure hypercholesterolemia, unspecified: Secondary | ICD-10-CM

## 2011-06-08 DIAGNOSIS — R9431 Abnormal electrocardiogram [ECG] [EKG]: Secondary | ICD-10-CM

## 2011-06-08 DIAGNOSIS — I2510 Atherosclerotic heart disease of native coronary artery without angina pectoris: Secondary | ICD-10-CM

## 2011-06-08 NOTE — Patient Instructions (Addendum)
Your physician recommends that you schedule a follow-up appointment in: YEAR WITH DR Excell Seltzer  Your physician has requested that you have a lexiscan myoview. For further information please visit [email or web address] Please follow instruction sheet, as given. DX ABN EKG 414.01 Your physician recommends that you continue on your current medications as directed. Please refer to the Current Medication list given to you today.

## 2011-06-09 ENCOUNTER — Encounter: Payer: Self-pay | Admitting: Cardiovascular Disease

## 2011-06-09 NOTE — Assessment & Plan Note (Signed)
While the patient has no cardiovascular symptoms at present, her EKG has a diffuse T wave abnormality concerning for ischemia. I compared this to her previous EKG from last year and this was not present at that time. I think we should check a pharmacologic nuclear scan to rule out significant ischemia. The patient is on appropriate medical therapy with low-dose aspirin and Crestor for treatment of hyperlipidemia. Since she does not have angina or hypertension, she is not treated with a beta blocker.

## 2011-06-09 NOTE — Assessment & Plan Note (Signed)
The patient is on a statin drug. Her goal LDL is less than 100 mg per deciliter.  She is followed by her primary care physician.

## 2011-06-09 NOTE — Progress Notes (Signed)
HPI:  This is a 70 year old woman presenting for followup evaluation. The patient had an acute MI in 1996 when she was treated with balloon angioplasty. Her last nuclear stress test was in 2006 when she was found to have inferolateral scar without ischemia and a left ventricular ejection fraction of 61%. She has been asymptomatic from a cardiovascular standpoint. The patient has undergone 2 surgeries in the last 6 months with a hardware removal in her right leg in April followed by right total knee arthroplasty in [place]. She went through both surgeries without any cardiac problems.  She denies chest pain, dyspnea, palpitations, or edema. She complains of generalized fatigue and relates this to recovery from her surgery appear  Outpatient Encounter Prescriptions as of 06/08/2011  Medication Sig Dispense Refill  . acetaminophen (TYLENOL) 325 MG tablet Take 650 mg by mouth every 6 (six) hours as needed.        Marland Kitchen aspirin 81 MG tablet Take 81 mg by mouth daily.        . calcium citrate-vitamin D 200-200 MG-UNIT TABS Take 1 tablet by mouth daily.        . cholecalciferol (VITAMIN D) 1000 UNITS tablet Take 1,000 Units by mouth daily.        Marland Kitchen HYDROmorphone (DILAUDID) 2 MG tablet as needed.      Marland Kitchen ibuprofen (ADVIL,MOTRIN) 200 MG tablet Take 200 mg by mouth every 6 (six) hours as needed.        . methotrexate (RHEUMATREX) 2.5 MG tablet Take by mouth once a week. Caution:Chemotherapy. Protect from light.  4 tablets once a week       . Prenatal Vit-DSS-Fe Cbn-FA (ULTRA NATALCARE) 90-1 MG TABS Take by mouth.        . rosuvastatin (CRESTOR) 10 MG tablet Take 10 mg by mouth daily.        . traZODone (DESYREL) 50 MG tablet Take 50 mg by mouth at bedtime.          Allergies  Allergen Reactions  . Doxycycline   . Erythromycin   . Levofloxacin   . Morphine   . Penicillins   . Sulfonamide Derivatives     Past Medical History  Diagnosis Date  . CAD (coronary artery disease)     significant with PTCA, MI in  1996  . Hypertension   . Dyslipidemia   . Rheumatoid arthritis   . H/O: hysterectomy     ROS: Negative except as per HPI  BP 128/78  Pulse 70  Ht 4\' 11"  (1.499 m)  Wt 145 lb (65.772 kg)  BMI 29.29 kg/m2  PHYSICAL EXAM: Pt is alert and oriented, NAD HEENT: normal Neck: JVP - normal, carotids 2+= without bruits Lungs: CTA bilaterally CV: RRR without murmur or gallop Abd: soft, NT, Positive BS, no hepatomegaly Ext: no C/C/E, distal pulses intact and equal Skin: warm/dry no rash  EKG:  Normal sinus rhythm 70 beats per minute, T wave abnormality consider anterolateral and inferior ischemia. Age indeterminate inferior MI.  ASSESSMENT AND PLAN:

## 2011-06-21 ENCOUNTER — Ambulatory Visit (HOSPITAL_COMMUNITY): Payer: Medicare Other | Attending: Cardiovascular Disease | Admitting: Radiology

## 2011-06-21 DIAGNOSIS — R0789 Other chest pain: Secondary | ICD-10-CM

## 2011-06-21 DIAGNOSIS — R9431 Abnormal electrocardiogram [ECG] [EKG]: Secondary | ICD-10-CM | POA: Insufficient documentation

## 2011-06-21 DIAGNOSIS — I2510 Atherosclerotic heart disease of native coronary artery without angina pectoris: Secondary | ICD-10-CM | POA: Insufficient documentation

## 2011-06-21 MED ORDER — TECHNETIUM TC 99M TETROFOSMIN IV KIT
33.0000 | PACK | Freq: Once | INTRAVENOUS | Status: AC | PRN
  Administered 2011-06-21: 33 via INTRAVENOUS

## 2011-06-21 MED ORDER — TECHNETIUM TC 99M TETROFOSMIN IV KIT
11.0000 | PACK | Freq: Once | INTRAVENOUS | Status: AC | PRN
  Administered 2011-06-21: 11 via INTRAVENOUS

## 2011-06-21 MED ORDER — REGADENOSON 0.4 MG/5ML IV SOLN
0.4000 mg | Freq: Once | INTRAVENOUS | Status: AC
  Administered 2011-06-21: 0.4 mg via INTRAVENOUS

## 2011-06-21 NOTE — Progress Notes (Signed)
[facility] SITE 3 NUCLEAR MED [address] [place] [address] [phone]  Cardiology Nuclear Med Study  Andrea Ibarra is a 70 y.o. female [id number] June 05, 1941   Nuclear Med Background Indication for Stress Test:  Evaluation for Ischemia History:  Asthma and '96 MI>PTCA; '06 [id number] scar, no ischemia, EF=61% Cardiac Risk Factors: Family History - CAD, History of Smoking, Hypertension and Lipids  Symptoms:  Chest Tightness (last date of chest discomfort was about one week ago), DOE, Fatigue, Fatigue with Exertion and Rapid HR   Nuclear Pre-Procedure Caffeine/Decaff Intake:  None NPO After: 11:00pm   Lungs:  Clear.  O2 SAT 99% on RA. IV 0.9% NS with Angio Cath:  22g  IV Site: R Hand  IV Started by:  Bonnita Levan, RN  Chest Size (in):  38 Cup Size: C  Height: 4\' 11"  (1.499 m)  Weight:  147 lb (66.679 kg)  BMI:  Body mass index is 29.69 kg/(m^2). Tech Comments:  N/A    Nuclear Med Study 1 or 2 day study: 1 day  Stress Test Type:  Lexiscan  Reading MD: Kristeen Miss, MD  Order Authorizing Provider:  Tonny Bollman, MD  Resting Radionuclide: Technetium 66m Tetrofosmin  Resting Radionuclide Dose: 11.0 mCi   Stress Radionuclide:  Technetium 31m Tetrofosmin  Stress Radionuclide Dose: 33.0 mCi           Stress Protocol Rest HR: 75 Stress HR: 102  Rest BP: 140/68 Stress BP: 149/74  Exercise Time (min): n/a METS: n/a   Predicted Max HR: 150 bpm % Max HR: 68 bpm Rate Pressure Product: 28413   Dose of Adenosine (mg):  n/a Dose of Lexiscan: 0.4 mg  Dose of Atropine (mg): n/a Dose of Dobutamine: n/a mcg/kg/min (at max HR)  Stress Test Technologist: Smiley Houseman, CMA-N  Nuclear Technologist:  Domenic Polite, CNMT     Rest Procedure:  Myocardial perfusion imaging was performed at rest 45 minutes following the intravenous administration of Technetium 56m Tetrofosmin.  Rest ECG: Nonspecific T-wave changes with occasional PVC's.  Stress  Procedure:  The patient received IV Lexiscan 0.4 mg over 15-seconds.  Technetium 81m Tetrofosmin injected at 30-seconds.  There were no significant changes with Lexiscan, other than frequent PVC's.  She did c/o her chest burning and neck tightness with infusion.  Quantitative spect images were obtained after a 45 minute delay.  Stress ECG: No significant change from baseline ECG  QPS Raw Data Images:  Normal; no motion artifact; normal heart/lung ratio. Stress Images:  There is a large, severe defect of the inferior lateral wall with fairly normal uptake in the other regions. Rest Images:  There is a large, severe defect of the inferior lateral wall with fairly normal uptake in the other regions Subtraction (SDS):  No evidence of ischemia. Transient Ischemic Dilatation (Normal <1.22):  0.99 Lung/Heart Ratio (Normal <0.45):  0.33  Quantitative Gated Spect Images QGS EDV:  66 ml QGS ESV:  26 ml QGS cine images:  The overall LV function is normal.  There is hypokinesis of the inferior lateral wall. QGS EF: 61%  Impression Exercise Capacity:  Lexiscan with no exercise. BP Response:  Normal blood pressure response. Clinical Symptoms:  No chest pain. ECG Impression:  No significant ST segment change suggestive of ischemia. Comparison with Prior Nuclear Study: No images to compare  Overall Impression:  Low risk stress nuclear study.  There is evidence of an old inferior lateral MI but no evidence of ischemia..  The LV function  is normal with mild hypokinesis of the inferior lateral wall.  The EF is 61%.    Vesta Mixer, Montez Hageman., MD, [facility]

## 2011-06-27 ENCOUNTER — Telehealth: Payer: Self-pay | Admitting: Cardiovascular Disease

## 2011-06-27 NOTE — Telephone Encounter (Signed)
Pt returning your call

## 2011-06-27 NOTE — Telephone Encounter (Signed)
Pt aware of myoview results 

## 2011-06-28 ENCOUNTER — Other Ambulatory Visit: Payer: Self-pay | Admitting: Gynecology

## 2011-06-28 DIAGNOSIS — Z1231 Encounter for screening mammogram for malignant neoplasm of breast: Secondary | ICD-10-CM

## 2011-07-17 ENCOUNTER — Encounter: Payer: Self-pay | Admitting: Cardiovascular Disease

## 2011-08-03 ENCOUNTER — Ambulatory Visit
Admission: RE | Admit: 2011-08-03 | Discharge: 2011-08-03 | Disposition: A | Discharge status: Home / Self Care | Payer: Medicare Other | Source: Ambulatory Visit | Attending: Gynecology | Admitting: Gynecology

## 2011-08-03 DIAGNOSIS — Z1231 Encounter for screening mammogram for malignant neoplasm of breast: Secondary | ICD-10-CM

## 2012-06-07 ENCOUNTER — Encounter: Payer: Self-pay | Admitting: Cardiovascular Disease

## 2012-06-07 ENCOUNTER — Ambulatory Visit (INDEPENDENT_AMBULATORY_CARE_PROVIDER_SITE_OTHER): Payer: Medicare Other | Admitting: Cardiovascular Disease

## 2012-06-07 VITALS — BP 110/70 | HR 87 | Resp 18 | Ht 61.0 in | Wt 150.8 lb

## 2012-06-07 DIAGNOSIS — I2510 Atherosclerotic heart disease of native coronary artery without angina pectoris: Secondary | ICD-10-CM

## 2012-06-07 NOTE — Progress Notes (Signed)
HPI:  71 year old woman presenting for followup evaluation. The patient has coronary artery disease and initially presented with an acute myocardial infarction in 1996. She was straight with balloon angioplasty. 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 The gated left ventricular ejection fraction was 61%. Lipids from last year showed a total cholesterol of 174, HDL 67, LDL 88, and triglycerides 97.  She has no cardiac related complaints today. She specifically denies chest pain or pressure, edema, or palpitations. She has chronic dyspnea with exertion and complains of shortness of breath with 1 flight of stairs. His is unchanged and she attributes this to lack of conditioning. 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 She has chronic pain in most of her joints.  Outpatient Encounter Prescriptions as of 06/07/2012  Medication Sig Dispense Refill  . acetaminophen (TYLENOL) 325 MG tablet Take 650 mg by mouth every 6 (six) hours as needed.        Marland Kitchen aspirin 81 MG tablet Take 81 mg by mouth daily.        . calcium citrate-vitamin D 200-200 MG-UNIT TABS Take 1 tablet by mouth daily.        . cholecalciferol (VITAMIN D) 1000 UNITS tablet Take 1,000 Units by mouth daily.        Marland Kitchen ibuprofen (ADVIL,MOTRIN) 200 MG tablet Take 200 mg by mouth every 6 (six) hours as needed.        . methotrexate (RHEUMATREX) 2.5 MG tablet Take by mouth once a week. Caution:Chemotherapy. Protect from light.  4 tablets once a week       . Prenatal Vit-DSS-Fe Cbn-FA (ULTRA NATALCARE) 90-1 MG TABS Take by mouth.        . rosuvastatin (CRESTOR) 10 MG tablet Take 10 mg by mouth daily.        . traZODone (DESYREL) 50 MG tablet Take 50 mg by mouth at bedtime.        Marland Kitchen DISCONTD: HYDROmorphone (DILAUDID) 2 MG tablet as needed.        Allergies  Allergen Reactions  . Ciprofloxacin Hcl   . Doxycycline   . Erythromycin   . Levofloxacin   .  Morphine   . Penicillins   . Sulfonamide Derivatives     Past Medical History  Diagnosis Date  . CAD (coronary artery disease)     significant with PTCA, MI in 1996  . Hypertension   . Dyslipidemia   . Rheumatoid arthritis   . H/O: hysterectomy     ROS: Negative except as per HPI  BP 110/70  Pulse 87  Resp 18  Ht 5\' 1"  (1.549 m)  Wt 68.402 kg (150 lb 12.8 oz)  BMI 28.49 kg/m2  SpO2 96%  PHYSICAL EXAM: Pt is alert and oriented, pleasant elderly woman in NAD HEENT: normal Neck: JVP - normal, carotids 2+= without bruits Lungs: CTA bilaterally CV: RRR without murmur or gallop Abd: soft, NT, Positive BS, no hepatomegaly Ext: no C/C/E, distal pulses intact and equal Skin: warm/dry no rash  EKG:  Normal sinus rhythm 81 beats per minute, within normal limits.  ASSESSMENT AND PLAN: 1. Coronary artery disease, native vessel. The patient is stable without anginal symptoms. Her nuclear scan last year was within normal limits. She remains on aspirin 81 mg daily and Crestor 10 mg daily for risk reduction. She will continue her same medical program and I would like to see her back in 12 months.  2. Hyperlipidemia. Lipids from last year were reviewed as described above. She is tolerating her statin drug well. She follows with her primary care physician.  Tonny Bollman 06/07/2012 11:58 AM

## 2012-06-07 NOTE — Patient Instructions (Addendum)
Your physician wants you to follow-up in:  12 months.  You will receive a reminder letter in the mail two months in advance. If you don't receive a letter, please call our office to schedule the follow-up appointment.   

## 2012-06-28 ENCOUNTER — Other Ambulatory Visit: Payer: Self-pay | Admitting: Gynecology

## 2012-06-28 DIAGNOSIS — Z1231 Encounter for screening mammogram for malignant neoplasm of breast: Secondary | ICD-10-CM

## 2012-08-05 ENCOUNTER — Ambulatory Visit
Admission: RE | Admit: 2012-08-05 | Discharge: 2012-08-05 | Disposition: A | Discharge status: Home / Self Care | Payer: Medicare Other | Source: Ambulatory Visit | Attending: Gynecology | Admitting: Gynecology

## 2012-08-05 DIAGNOSIS — Z1231 Encounter for screening mammogram for malignant neoplasm of breast: Secondary | ICD-10-CM

## 2012-11-02 ENCOUNTER — Other Ambulatory Visit: Payer: Self-pay

## 2013-04-23 ENCOUNTER — Other Ambulatory Visit: Payer: Self-pay

## 2013-06-11 ENCOUNTER — Ambulatory Visit (INDEPENDENT_AMBULATORY_CARE_PROVIDER_SITE_OTHER): Payer: Medicare Other | Admitting: Cardiovascular Disease

## 2013-06-11 ENCOUNTER — Encounter: Payer: Self-pay | Admitting: Cardiovascular Disease

## 2013-06-11 VITALS — BP 140/98 | HR 76 | Wt 154.0 lb

## 2013-06-11 DIAGNOSIS — I252 Old myocardial infarction: Secondary | ICD-10-CM

## 2013-06-11 DIAGNOSIS — E7800 Pure hypercholesterolemia, unspecified: Secondary | ICD-10-CM

## 2013-06-11 NOTE — Progress Notes (Signed)
HPI:   72 year old woman presenting for followup evaluation. The patient has coronary artery disease and initially presented with an acute myocardial infarction in 1996. She was treated with balloon angioplasty. 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 The gated left ventricular ejection fraction was 61%.  The patient is doing fairly well. She recently had cataract surgery. She remains somewhat limited by arthritis. She has mild dyspnea with exertion which is unchanged over time. On occasion she has chest pressure with exertion. This is also unchanged. She has not required any nitroglycerin. His been no escalation in the pattern of her chest discomfort. There is no predictable chest pain when she exerts herself. She denies leg swelling, orthopnea, PND, or palpitations.  Outpatient Encounter Prescriptions as of 06/11/2013  Medication Sig Dispense Refill  . acetaminophen (TYLENOL) 325 MG tablet Take 650 mg by mouth every 6 (six) hours as needed.        Marland Kitchen aspirin 81 MG tablet Take 81 mg by mouth daily.        . calcium citrate-vitamin D 200-200 MG-UNIT TABS Take 1 tablet by mouth daily.        . cholecalciferol (VITAMIN D) 1000 UNITS tablet Take 1,000 Units by mouth daily.        Marland Kitchen ibuprofen (ADVIL,MOTRIN) 200 MG tablet Take 200 mg by mouth every 6 (six) hours as needed.        . methotrexate (RHEUMATREX) 2.5 MG tablet Take by mouth once a week. Caution:Chemotherapy. Protect from light.  4 tablets once a week       . Prenatal Vit-DSS-Fe Cbn-FA (ULTRA NATALCARE) 90-1 MG TABS Take by mouth.        . rosuvastatin (CRESTOR) 10 MG tablet Take 10 mg by mouth daily.        . traZODone (DESYREL) 50 MG tablet Take 50 mg by mouth at bedtime.         No facility-administered encounter medications on file as of 06/11/2013.    Allergies  Allergen Reactions  . Ciprofloxacin Hcl   . Doxycycline   . Erythromycin   . Levofloxacin   . Morphine     . Penicillins   . Sulfonamide Derivatives     Past Medical History  Diagnosis Date  . CAD (coronary artery disease)     significant with PTCA, MI in 1996  . Hypertension   . Dyslipidemia   . Rheumatoid arthritis(714.0)   . H/O: hysterectomy     ROS: Negative except as per HPI  BP 140/98  Pulse 76  Wt 154 lb (69.854 kg)  BMI 29.11 kg/m2  PHYSICAL EXAM: Pt is alert and oriented, NAD HEENT: normal Neck: JVP - normal, carotids 2+= without bruits Lungs: CTA bilaterally CV: RRR without murmur or gallop Abd: soft, NT, Positive BS, no hepatomegaly Ext: no C/C/E, distal pulses intact and equal Skin: warm/dry no rash  EKG:  Normal sinus rhythm 76 beats per minute, occasional PVC, T-wave abnormality consider inferior and anterolateral ischemia. There is no significant change from her 2012 EKG.  ASSESSMENT AND PLAN: 1. Coronary artery disease, native vessel. The patient has rare chest discomfort. The pattern is unchanged over several years. There were no EKG changes in comparison to her previous study. I would recommend ongoing medical management. She had a stress test in 2012 that showed no significant ischemia.  2. Hyperlipidemia. The patient is on Crestor 10 mg and she is followed by her primary  care physician.  3. Hypertension. Blood pressure was elevated today. The patient states this is because she rushed in to the office. Past blood pressures reviewed and they have always been in range. Last week after I surgery her blood pressure is 120/50. Will continue observation.  For followup I will see her back in one year.  Tonny Bollman 06/11/2013 12:45 PM

## 2013-06-11 NOTE — Patient Instructions (Signed)
Your physician wants you to follow-up in: 1 YEAR with Dr Cooper.  You will receive a reminder letter in the mail two months in advance. If you don't receive a letter, please call our office to schedule the follow-up appointment.  Your physician recommends that you continue on your current medications as directed. Please refer to the Current Medication list given to you today.  

## 2013-07-17 ENCOUNTER — Other Ambulatory Visit: Payer: Self-pay

## 2013-07-17 DIAGNOSIS — Z1231 Encounter for screening mammogram for malignant neoplasm of breast: Secondary | ICD-10-CM

## 2013-07-24 ENCOUNTER — Other Ambulatory Visit: Payer: Self-pay

## 2013-07-31 ENCOUNTER — Emergency Department (HOSPITAL_BASED_OUTPATIENT_CLINIC_OR_DEPARTMENT_OTHER): Payer: Medicare Other

## 2013-07-31 ENCOUNTER — Emergency Department (HOSPITAL_BASED_OUTPATIENT_CLINIC_OR_DEPARTMENT_OTHER)
Admission: EM | Admit: 2013-07-31 | Discharge: 2013-07-31 | Disposition: A | Discharge status: Home / Self Care | Payer: Medicare Other | Attending: Emergency Medicine | Admitting: Emergency Medicine

## 2013-07-31 ENCOUNTER — Encounter (HOSPITAL_BASED_OUTPATIENT_CLINIC_OR_DEPARTMENT_OTHER): Payer: Self-pay | Admitting: Emergency Medicine

## 2013-07-31 DIAGNOSIS — M069 Rheumatoid arthritis, unspecified: Secondary | ICD-10-CM | POA: Insufficient documentation

## 2013-07-31 DIAGNOSIS — R42 Dizziness and giddiness: Secondary | ICD-10-CM | POA: Insufficient documentation

## 2013-07-31 DIAGNOSIS — I10 Essential (primary) hypertension: Secondary | ICD-10-CM | POA: Insufficient documentation

## 2013-07-31 DIAGNOSIS — Z90710 Acquired absence of both cervix and uterus: Secondary | ICD-10-CM | POA: Insufficient documentation

## 2013-07-31 DIAGNOSIS — Z87891 Personal history of nicotine dependence: Secondary | ICD-10-CM | POA: Insufficient documentation

## 2013-07-31 DIAGNOSIS — I2510 Atherosclerotic heart disease of native coronary artery without angina pectoris: Secondary | ICD-10-CM | POA: Insufficient documentation

## 2013-07-31 DIAGNOSIS — Z79899 Other long term (current) drug therapy: Secondary | ICD-10-CM | POA: Insufficient documentation

## 2013-07-31 DIAGNOSIS — Z7982 Long term (current) use of aspirin: Secondary | ICD-10-CM | POA: Insufficient documentation

## 2013-07-31 DIAGNOSIS — Z880 Allergy status to penicillin: Secondary | ICD-10-CM | POA: Insufficient documentation

## 2013-07-31 DIAGNOSIS — H81399 Other peripheral vertigo, unspecified ear: Secondary | ICD-10-CM

## 2013-07-31 DIAGNOSIS — E785 Hyperlipidemia, unspecified: Secondary | ICD-10-CM | POA: Insufficient documentation

## 2013-07-31 LAB — URINALYSIS, ROUTINE W REFLEX MICROSCOPIC
Ketones, ur: NEGATIVE mg/dL
Leukocytes, UA: NEGATIVE
Nitrite: NEGATIVE
Protein, ur: NEGATIVE mg/dL
Urobilinogen, UA: 0.2 mg/dL (ref 0.0–1.0)

## 2013-07-31 LAB — CBC WITH DIFFERENTIAL/PLATELET
Basophils Absolute: 0.1 10*3/uL (ref 0.0–0.1)
Basophils Relative: 1 % (ref 0–1)
Eosinophils Absolute: 0.1 10*3/uL (ref 0.0–0.7)
Eosinophils Relative: 1 % (ref 0–5)
Lymphocytes Relative: 12 % (ref 12–46)
MCHC: 34.1 g/dL (ref 30.0–36.0)
MCV: 94.1 fL (ref 78.0–100.0)
Neutro Abs: 6.4 10*3/uL (ref 1.7–7.7)
Platelets: 161 10*3/uL (ref 150–400)
RDW: 12.6 % (ref 11.5–15.5)
WBC: 8.1 10*3/uL (ref 4.0–10.5)

## 2013-07-31 LAB — URINE MICROSCOPIC-ADD ON

## 2013-07-31 LAB — BASIC METABOLIC PANEL
CO2: 26 mEq/L (ref 19–32)
Calcium: 9.6 mg/dL (ref 8.4–10.5)
GFR calc Af Amer: 83 mL/min — ABNORMAL LOW (ref >90)
GFR calc non Af Amer: 72 mL/min — ABNORMAL LOW (ref >90)
Potassium: 3.8 mEq/L (ref 3.5–5.1)
Sodium: 141 mEq/L (ref 135–145)

## 2013-07-31 MED ORDER — MECLIZINE HCL 12.5 MG PO TABS: 12.5000 mg | ORAL_TABLET | Freq: Three times a day (TID) | ORAL | Status: DC | PRN

## 2013-07-31 MED ORDER — MECLIZINE HCL 25 MG PO TABS
12.5000 mg | ORAL_TABLET | Freq: Once | ORAL | Status: AC
  Administered 2013-07-31: 12.5 mg via ORAL
  Filled 2013-07-31: qty 1

## 2013-07-31 NOTE — ED Notes (Signed)
Onalee Hua, NP back in to speak with the patient and family

## 2013-07-31 NOTE — ED Notes (Signed)
Dr. Judd Lien at the bedside to speak with the patient and family

## 2013-07-31 NOTE — ED Notes (Signed)
Pt from home reports she started having dizziness this morning at 0800. Pt states she has some pressure in the front of the face but thinks that might be sinuses. Denies any pain associated with the symptoms. NAD.
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
## 2013-08-01 NOTE — ED Provider Notes (Signed)
Medical screening examination/treatment/procedure(s) were conducted as a shared visit with non-physician practitioner(s) and myself.  I personally evaluated the patient during the encounter. Patient is a 72 year old female with past medical history of coronary artery disease and hypertension. She presents today complaining of dizziness which began this morning. She describes her symptoms as a spinning sensation which is worsened with position and movement and relieved with rest. She denies any headache or any visual disturbances. She denies any weakness or tingling in her arms or legs. Her symptoms are associated with nausea.  On exam, vitals are stable the patient is afebrile. Head is atraumatic normocephalic. Tympanic membranes are clear bilaterally. Pupils are equally round and reactive next rectum muscles are intact. Cranial nerves are intact without deficits. Heart is regular rate and rhythm and lungs are clear. There is no nystagmus. Hallpike test is positive.  Workup reveals a negative head CT, unchanged EKG, laboratory studies that do not reveal any acute process. Her presentation, exam, and workup are all consistent with a peripheral vertigo. She was given meclizine and is feeling somewhat better. She will be discharged home with the same, return if she develops worsening of her symptoms or if her symptoms do not resolve in the next 2-3 days.  EKG Interpretation     Ventricular Rate:  76 PR Interval:  122 QRS Duration: 76 QT Interval:  364 QTC Calculation: 409 R Axis:   42 Text Interpretation:  Normal sinus rhythm Normal ECG             Geoffery Lyons, MD 08/01/13 [id number]

## 2013-08-15 ENCOUNTER — Encounter (HOSPITAL_BASED_OUTPATIENT_CLINIC_OR_DEPARTMENT_OTHER): Payer: Self-pay | Admitting: Emergency Medicine

## 2013-08-15 ENCOUNTER — Emergency Department (HOSPITAL_BASED_OUTPATIENT_CLINIC_OR_DEPARTMENT_OTHER)
Admission: EM | Admit: 2013-08-15 | Discharge: 2013-08-15 | Disposition: A | Discharge status: Home / Self Care | Payer: Medicare Other | Attending: Emergency Medicine | Admitting: Emergency Medicine

## 2013-08-15 ENCOUNTER — Emergency Department (HOSPITAL_BASED_OUTPATIENT_CLINIC_OR_DEPARTMENT_OTHER): Payer: Medicare Other

## 2013-08-15 DIAGNOSIS — I2510 Atherosclerotic heart disease of native coronary artery without angina pectoris: Secondary | ICD-10-CM | POA: Diagnosis not present

## 2013-08-15 DIAGNOSIS — Z9861 Coronary angioplasty status: Secondary | ICD-10-CM | POA: Diagnosis not present

## 2013-08-15 DIAGNOSIS — Y9301 Activity, walking, marching and hiking: Secondary | ICD-10-CM | POA: Insufficient documentation

## 2013-08-15 DIAGNOSIS — Y9289 Other specified places as the place of occurrence of the external cause: Secondary | ICD-10-CM | POA: Diagnosis not present

## 2013-08-15 DIAGNOSIS — S92309A Fracture of unspecified metatarsal bone(s), unspecified foot, initial encounter for closed fracture: Secondary | ICD-10-CM | POA: Diagnosis not present

## 2013-08-15 DIAGNOSIS — E785 Hyperlipidemia, unspecified: Secondary | ICD-10-CM | POA: Insufficient documentation

## 2013-08-15 DIAGNOSIS — I10 Essential (primary) hypertension: Secondary | ICD-10-CM | POA: Diagnosis not present

## 2013-08-15 DIAGNOSIS — Z90710 Acquired absence of both cervix and uterus: Secondary | ICD-10-CM | POA: Insufficient documentation

## 2013-08-15 DIAGNOSIS — Z880 Allergy status to penicillin: Secondary | ICD-10-CM | POA: Diagnosis not present

## 2013-08-15 DIAGNOSIS — Z79899 Other long term (current) drug therapy: Secondary | ICD-10-CM | POA: Insufficient documentation

## 2013-08-15 DIAGNOSIS — I252 Old myocardial infarction: Secondary | ICD-10-CM | POA: Diagnosis not present

## 2013-08-15 DIAGNOSIS — Z7982 Long term (current) use of aspirin: Secondary | ICD-10-CM | POA: Insufficient documentation

## 2013-08-15 DIAGNOSIS — W1809XA Striking against other object with subsequent fall, initial encounter: Secondary | ICD-10-CM | POA: Diagnosis not present

## 2013-08-15 DIAGNOSIS — Z87891 Personal history of nicotine dependence: Secondary | ICD-10-CM | POA: Diagnosis not present

## 2013-08-15 DIAGNOSIS — S92302A Fracture of unspecified metatarsal bone(s), left foot, initial encounter for closed fracture: Secondary | ICD-10-CM

## 2013-08-15 DIAGNOSIS — S8990XA Unspecified injury of unspecified lower leg, initial encounter: Secondary | ICD-10-CM | POA: Diagnosis present

## 2013-08-15 DIAGNOSIS — M069 Rheumatoid arthritis, unspecified: Secondary | ICD-10-CM | POA: Insufficient documentation

## 2013-08-15 HISTORY — DX: Dizziness and giddiness: R42

## 2013-08-15 MED ORDER — HYDROCODONE-ACETAMINOPHEN 5-325 MG PO TABS: 0.5000 | ORAL_TABLET | Freq: Four times a day (QID) | ORAL | Status: DC | PRN
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
## 2013-08-15 NOTE — ED Notes (Signed)
Pt tripped on chair and fell yesterday.  Pt c/o left foot pain.  Good cms.

## 2013-08-28 ENCOUNTER — Ambulatory Visit: Payer: Medicare Other

## 2013-11-03 ENCOUNTER — Ambulatory Visit: Payer: Medicare Other

## 2013-11-20 ENCOUNTER — Ambulatory Visit: Payer: Medicare Other

## 2013-11-28 ENCOUNTER — Ambulatory Visit
Admission: RE | Admit: 2013-11-28 | Discharge: 2013-11-28 | Disposition: A | Discharge status: Home / Self Care | Payer: Medicare Other | Source: Ambulatory Visit

## 2013-11-28 DIAGNOSIS — Z1231 Encounter for screening mammogram for malignant neoplasm of breast: Secondary | ICD-10-CM

## 2014-01-23 ENCOUNTER — Other Ambulatory Visit (HOSPITAL_COMMUNITY): Payer: Self-pay | Admitting: Orthopedic Surgery

## 2014-01-23 ENCOUNTER — Ambulatory Visit (HOSPITAL_COMMUNITY)
Admission: RE | Admit: 2014-01-23 | Discharge: 2014-01-23 | Disposition: A | Discharge status: Home / Self Care | Payer: Medicare Other | Source: Ambulatory Visit | Attending: Orthopedic Surgery | Admitting: Orthopedic Surgery

## 2014-01-23 DIAGNOSIS — M79609 Pain in unspecified limb: Secondary | ICD-10-CM

## 2014-01-23 DIAGNOSIS — M79669 Pain in unspecified lower leg: Secondary | ICD-10-CM

## 2014-01-23 DIAGNOSIS — M7989 Other specified soft tissue disorders: Secondary | ICD-10-CM

## 2014-01-23 NOTE — Progress Notes (Signed)
VASCULAR LAB PRELIMINARY  PRELIMINARY  PRELIMINARY  PRELIMINARY  Left lower extremity venous duplex completed.    Preliminary report:  Left:  No evidence of DVT, superficial thrombosis, or Baker's cyst.  Nani Ravens, RVT 01/23/2014, 2:04 PM

## 2014-02-10 ENCOUNTER — Encounter: Payer: Self-pay | Admitting: Physician Assistant

## 2014-02-10 ENCOUNTER — Ambulatory Visit (INDEPENDENT_AMBULATORY_CARE_PROVIDER_SITE_OTHER): Payer: Medicare Other | Admitting: Physician Assistant

## 2014-02-10 VITALS — BP 124/72 | HR 74 | Ht 59.0 in | Wt 154.0 lb

## 2014-02-10 DIAGNOSIS — I2510 Atherosclerotic heart disease of native coronary artery without angina pectoris: Secondary | ICD-10-CM

## 2014-02-10 DIAGNOSIS — R0602 Shortness of breath: Secondary | ICD-10-CM

## 2014-02-10 DIAGNOSIS — R609 Edema, unspecified: Secondary | ICD-10-CM

## 2014-02-10 DIAGNOSIS — E7800 Pure hypercholesterolemia, unspecified: Secondary | ICD-10-CM

## 2014-02-10 NOTE — Progress Notes (Signed)
Cardiology Office Note   Date:  02/10/2014   ID:  Andrea Ibarra, Andrea Ibarra 1941/03/13, MRN [id number]  PCP:  Horatio Pel, MD  Cardiologist:  Dr. Sherren Mocha      History of Present Illness: Andrea Ibarra is a 73 y.o. female with a history of CAD, status post acute MI in 1996 treated with balloon angioplasty, rheumatoid arthritis, HTN, HL. Last seen by Dr. Burt Knack 05/2013.    She broke her left foot in November. She wore a boot for 4 months. She started having left calf and shin pain shortly after this was removed. Her orthopedist felt that she had some edema in her leg. Ultrasound was negative for DVT. Patient does note decreased swelling in the morning that is worse throughout the day. She notes a 1-2 year history of dyspnea with exertion. She is NYHA class 2-2b. There has been no significant change over time. She denies orthopnea, PND. She denies chest discomfort or syncope.  Studies:  - Nuclear (06/2011):  Inf-lat infarct, no ischemia, EF 61%; Low Risk  - Carotid US (08/2008): No significant ICA stenosis bilaterally   Lower Extremity Venous Duplex (01/23/14): Summary: - No evidence of deep vein thrombosis involving the left lower extremity. - No evidence of deep vein thrombosis involving the right common femoral vein.   Recent Labs: 07/31/2013: Creatinine 0.80; Hemoglobin 14.2; Potassium 3.8   Wt Readings from Last 3 Encounters:  08/15/13 150 lb (68.04 kg)  07/31/13 150 lb (68.04 kg)  06/11/13 154 lb (69.854 kg)     Past Medical History  Diagnosis Date  . CAD (coronary artery disease)     significant with PTCA, MI in 1996  . Hypertension   . Dyslipidemia   . Rheumatoid arthritis(714.0)   . H/O: hysterectomy   . Vertigo     Current Outpatient Prescriptions  Medication Sig Dispense Refill  . acetaminophen (TYLENOL) 325 MG tablet Take 650 mg by mouth every 6 (six) hours as needed.        Marland Kitchen aspirin 81 MG tablet Take 81 mg by mouth daily.        . calcium  citrate-vitamin D 200-200 MG-UNIT TABS Take 1 tablet by mouth daily.        . cetirizine (ZYRTEC) 10 MG tablet Take 5 mg by mouth daily.      . cholecalciferol (VITAMIN D) 1000 UNITS tablet Take 1,000 Units by mouth daily.        . folic acid (FOLVITE) 1 MG tablet Take 1 mg by mouth daily.      Marland Kitchen HYDROcodone-acetaminophen (NORCO) 5-325 MG per tablet Take 0.5 tablets by mouth every 6 (six) hours as needed for moderate pain.  5 tablet  0  . ibuprofen (ADVIL,MOTRIN) 200 MG tablet Take 200 mg by mouth every 6 (six) hours as needed.        . meclizine (ANTIVERT) 12.5 MG tablet Take 1 tablet (12.5 mg total) by mouth 3 (three) times daily as needed for dizziness.  30 tablet  0  . methotrexate (RHEUMATREX) 2.5 MG tablet Take by mouth once a week. Caution:Chemotherapy. Protect from light.  4 tablets once a week       . naproxen sodium (ANAPROX) 220 MG tablet Take 220 mg by mouth as needed.      . Prenatal Vit-DSS-Fe Cbn-FA (ULTRA NATALCARE) 90-1 MG TABS Take by mouth.        . rosuvastatin (CRESTOR) 10 MG tablet Take 10 mg by mouth daily.        Marland Kitchen  traZODone (DESYREL) 50 MG tablet Take 50 mg by mouth at bedtime.         No current facility-administered medications for this visit.    Allergies:   Ciprofloxacin hcl; Codeine; Doxycycline; Erythromycin; Levofloxacin; Morphine; Other; Oxycodone; Penicillins; Percocet; and Sulfonamide derivatives   Social History:  The patient  reports that she has quit smoking. She does not have any smokeless tobacco history on file. She reports that she does not drink alcohol or use illicit drugs.   Family History:  The patient's family history includes Coronary artery disease in her father; Heart attack in her father; Heart failure in her mother.   ROS:  Please see the history of present illness.   Has occasional dizziness with standing.   All other systems reviewed and negative.   PHYSICAL EXAM: VS:  BP 124/72  Pulse 74  Ht 4\' 11"  (1.499 m)  Wt 154 lb (69.854 kg)   BMI 31.09 kg/m2 Well nourished, well developed, in no acute distress HEENT: normal Neck: no JVD Cardiac:  normal S1, S2; RRR; no murmur Lungs:  clear to auscultation bilaterally, no wheezing, rhonchi or rales Abd: soft, nontender, no hepatomegaly Ext: no edemaleft shin somewhat tender to palpation Skin: warm and dry Neuro:  CNs 2-12 intact, no focal abnormalities noted  EKG:  NSR, HR 65, normal axis, T-wave inversions in 3, aVF, no significant change compared to prior tracings     ASSESSMENT AND PLAN:  1. Edema: There is no edema evident on exam today. At this point, there does not appear to be a clear cardiac cause for her LE edema. Some of her symptoms are suggestive of dependent edema from venous insufficiency. She has had some increased dyspnea over the last 1-2 years. I will obtain a basic metabolic panel and BNP. I will also obtain an echocardiogram. If these tests are unremarkable, no further cardiac workup is necessary at this time. 2. Shortness of breath: Obtain basic metabolic panel and BNP as well as echocardiogram as noted above. 3. Coronary atherosclerosis of native coronary artery: No symptoms of angina. Continue aspirin, statin. 4. Pure hypercholesterolemia: Continue statin. 5. Disposition: Followup with Dr. Burt Knack in 3 months.   Signed, Versie Starks, MHS 02/10/2014 4:20 PM    [facility] [address], [place], [place]  [address] Phone: [phone]; Fax: [phone]

## 2014-02-10 NOTE — Patient Instructions (Signed)
Your physician recommends that you continue on your current medications as directed. Please refer to the Current Medication list given to you today.  LAB WORK TODAY; BMET, BNP  Your physician has requested that you have an echocardiogram. Echocardiography is a painless test that uses sound waves to create images of your heart. It provides your doctor with information about the size and shape of your heart and how well your heart's chambers and valves are working. This procedure takes approximately one hour. There are no restrictions for this procedure.  Your physician recommends that you schedule a follow-up appointment in: [place]. Burt Knack

## 2014-02-11 ENCOUNTER — Telehealth: Payer: Self-pay | Admitting: *Deleted

## 2014-02-11 ENCOUNTER — Ambulatory Visit (HOSPITAL_COMMUNITY): Payer: Medicare Other | Attending: Cardiovascular Disease | Admitting: Radiology

## 2014-02-11 DIAGNOSIS — R609 Edema, unspecified: Secondary | ICD-10-CM

## 2014-02-11 DIAGNOSIS — R0602 Shortness of breath: Secondary | ICD-10-CM

## 2014-02-11 DIAGNOSIS — I2510 Atherosclerotic heart disease of native coronary artery without angina pectoris: Secondary | ICD-10-CM | POA: Insufficient documentation

## 2014-02-11 LAB — BRAIN NATRIURETIC PEPTIDE: Pro B Natriuretic peptide (BNP): 51 pg/mL (ref 0.0–100.0)

## 2014-02-11 LAB — BASIC METABOLIC PANEL
BUN: 18 mg/dL (ref 6–23)
CO2: 28 mEq/L (ref 19–32)
Calcium: 9.8 mg/dL (ref 8.4–10.5)
Chloride: 105 mEq/L (ref 96–112)
Creatinine, Ser: 0.9 mg/dL (ref 0.4–1.2)
GFR: 68.76 mL/min (ref >60.00)
Glucose, Bld: 97 mg/dL (ref 70–99)
Potassium: 4.6 mEq/L (ref 3.5–5.1)
SODIUM: 139 meq/L (ref 135–145)

## 2014-02-11 NOTE — Progress Notes (Signed)
Echocardiogram performed.  

## 2014-02-11 NOTE — Telephone Encounter (Signed)
lmptcb for lab results 

## 2014-02-11 NOTE — Telephone Encounter (Signed)
pt notified about normal lab results. Advised I will let her know when echo has been read.

## 2014-02-13 ENCOUNTER — Telehealth: Payer: Self-pay | Admitting: Cardiovascular Disease

## 2014-02-13 DIAGNOSIS — R0602 Shortness of breath: Secondary | ICD-10-CM

## 2014-02-13 DIAGNOSIS — I2510 Atherosclerotic heart disease of native coronary artery without angina pectoris: Secondary | ICD-10-CM

## 2014-02-13 MED ORDER — METOPROLOL TARTRATE 25 MG PO TABS: 12.5000 mg | ORAL_TABLET | Freq: Two times a day (BID) | ORAL | Status: DC

## 2014-02-13 MED ORDER — LOSARTAN POTASSIUM 25 MG PO TABS
25.0000 mg | ORAL_TABLET | Freq: Every day | ORAL | Status: DC
Start: 2014-02-13 — End: 2014-09-07

## 2014-02-13 NOTE — Telephone Encounter (Signed)
New message  ° ° °Patient calling for test results.   °

## 2014-02-13 NOTE — Telephone Encounter (Signed)
I spoke with the pt and made her aware of Echo results.  She will start medications as recommended.  Prescriptions sent to pharmacy and the pt will have BMP drawn on 02/19/14.

## 2014-02-19 ENCOUNTER — Other Ambulatory Visit: Payer: Medicare Other

## 2014-02-20 ENCOUNTER — Other Ambulatory Visit (INDEPENDENT_AMBULATORY_CARE_PROVIDER_SITE_OTHER): Payer: Medicare Other

## 2014-02-20 DIAGNOSIS — R0602 Shortness of breath: Secondary | ICD-10-CM

## 2014-02-20 DIAGNOSIS — I2510 Atherosclerotic heart disease of native coronary artery without angina pectoris: Secondary | ICD-10-CM

## 2014-02-20 LAB — BASIC METABOLIC PANEL
BUN: 18 mg/dL (ref 6–23)
CO2: 27 mEq/L (ref 19–32)
CREATININE: 0.8 mg/dL (ref 0.4–1.2)
Calcium: 9.9 mg/dL (ref 8.4–10.5)
Chloride: 104 mEq/L (ref 96–112)
GFR: 74.74 mL/min (ref >60.00)
Glucose, Bld: 68 mg/dL — ABNORMAL LOW (ref 70–99)
Potassium: 4.5 mEq/L (ref 3.5–5.1)
Sodium: 138 mEq/L (ref 135–145)

## 2014-02-24 ENCOUNTER — Telehealth: Payer: Self-pay | Admitting: Cardiovascular Disease

## 2014-02-24 NOTE — Telephone Encounter (Signed)
I spoke with the pt and made her aware of 02/20/14 BMP results. I made her aware that she should not need labs prior to her September appointment with Dr Burt Knack.  The pt's PCP checks her cholesterol.

## 2014-02-24 NOTE — Telephone Encounter (Signed)
New Message:  Pt wants to know if she will need to have her blood drawn again before she sees Dr. Burt Knack next. States she just saw Richardson Dopp and had her labs drawn last week. The pt states she is under the impression that she will need her K+ checked more frequently

## 2014-04-06 ENCOUNTER — Telehealth: Payer: Self-pay | Admitting: Cardiovascular Disease

## 2014-04-06 NOTE — Telephone Encounter (Signed)
New message    Patient calling C/O having problem with medication that was prescribe - metoprolol & losartan . Would like a call back today

## 2014-04-06 NOTE — Telephone Encounter (Signed)
Pt states she has been having hot flashes and having headaches more then normal. Pt thinks that is ether  Metoprolol or Losartan that is doing this. Pt started taking these medications  in April this year. Pt has been having these symptoms for a couple of months. Pt said that she is post menopause, and  She did not have hot flushes before.

## 2014-04-07 NOTE — Telephone Encounter (Signed)
Would stop metoprolol first. If no better in one week, stop losartan.

## 2014-04-08 NOTE — Telephone Encounter (Signed)
Notified that she should stop the Metoprolol first and then if symptoms do not resolve then to stop Losartan.  Advised to let us know if symptoms do resolve.

## 2014-06-10 ENCOUNTER — Ambulatory Visit (INDEPENDENT_AMBULATORY_CARE_PROVIDER_SITE_OTHER): Payer: Medicare Other | Admitting: Cardiovascular Disease

## 2014-06-10 ENCOUNTER — Telehealth: Payer: Self-pay | Admitting: Cardiovascular Disease

## 2014-06-10 ENCOUNTER — Encounter: Payer: Self-pay | Admitting: Cardiovascular Disease

## 2014-06-10 VITALS — BP 136/86 | HR 71 | Ht 59.0 in | Wt 156.1 lb

## 2014-06-10 DIAGNOSIS — I252 Old myocardial infarction: Secondary | ICD-10-CM

## 2014-06-10 MED ORDER — PREDNISONE 20 MG PO TABS: 20.0000 mg | ORAL_TABLET | Freq: Every day | ORAL | Status: DC

## 2014-06-10 MED ORDER — FAMOTIDINE 20 MG PO TABS: ORAL_TABLET | ORAL | Status: DC

## 2014-06-10 NOTE — Patient Instructions (Addendum)
Your physician recommends that you continue on your current medications as directed. Please refer to the Current Medication list given to you today.  Your physician wants you to follow-up in: 1 year with Dr. Emelda Fear will receive a reminder letter in the mail two months in advance. If you don't receive a letter, please call our office to schedule the follow-up appointment.  You have

## 2014-06-10 NOTE — Progress Notes (Signed)
HPI:  73 year old woman presenting for followup evaluation. The patient has a history of coronary artery disease with remote myocardial infarction in 1996 treated with balloon angioplasty. She also has rheumatoid arthritis, essential hypertension, and hyperlipidemia. She was seen earlier this year by Richardson Dopp and noted to have leg swelling and exertional dyspnea. Symptoms were felt to be related to orthopedic problems and venous insufficiency. However, with her shortness of breath, an echocardiogram was done. This showed mild segmental LV dysfunction consistent with her previous inferolateral infarction. At the time she was on minimal cardiac medications. We started her on metoprolol but she did not tolerate this. She had severe hot flashes. She has tolerated losartan 25 mg daily. She still has mild hot flashes and also associates headaches with this medication. She has no chest pain, shortness of breath, or recurrent leg swelling. She otherwise feels well.  Outpatient Encounter Prescriptions as of 06/10/2014  Medication Sig  . acetaminophen (TYLENOL) 325 MG tablet Take 650 mg by mouth every 6 (six) hours as needed.    Marland Kitchen aspirin 81 MG tablet Take 81 mg by mouth daily.    . calcium citrate-vitamin D 200-200 MG-UNIT TABS Take 1 tablet by mouth daily.    . cetirizine (ZYRTEC) 10 MG tablet Take 5 mg by mouth daily.  . cholecalciferol (VITAMIN D) 1000 UNITS tablet Take 1,000 Units by mouth daily.    . folic acid (FOLVITE) 1 MG tablet Take 1 mg by mouth daily.  Marland Kitchen ibuprofen (ADVIL,MOTRIN) 200 MG tablet Take 200 mg by mouth every 6 (six) hours as needed.    Marland Kitchen losartan (COZAAR) 25 MG tablet Take 1 tablet (25 mg total) by mouth daily.  . meclizine (ANTIVERT) 12.5 MG tablet Take 1 tablet (12.5 mg total) by mouth 3 (three) times daily as needed for dizziness.  . methotrexate (RHEUMATREX) 2.5 MG tablet Take 2.5 mg by mouth once a week. Caution:Chemotherapy. Protect from light.  6 tablets once a week  .  rosuvastatin (CRESTOR) 10 MG tablet Take 10 mg by mouth daily.    . traZODone (DESYREL) 50 MG tablet Take 50 mg by mouth at bedtime.    . [DISCONTINUED] HYDROcodone-acetaminophen (NORCO) 5-325 MG per tablet Take 0.5 tablets by mouth every 6 (six) hours as needed for moderate pain.  . [DISCONTINUED] metoprolol tartrate (LOPRESSOR) 25 MG tablet Take 0.5 tablets (12.5 mg total) by mouth 2 (two) times daily.  . [DISCONTINUED] naproxen sodium (ANAPROX) 220 MG tablet Take 220 mg by mouth as needed.  . [DISCONTINUED] Prenatal Vit-DSS-Fe Cbn-FA (ULTRA NATALCARE) 90-1 MG TABS Take by mouth.      Allergies  Allergen Reactions  . Metoprolol Tartrate Other (See Comments)    Hot flashes  . Ciprofloxacin Hcl     Severe muscle pain  . Codeine Nausea Only  . Doxycycline   . Erythromycin Hives  . Levofloxacin Nausea Only    And severe muscle pain  . Morphine Nausea Only    And severe HA  . Other     Anesthesia needs anti nausea meds with this   . Oxycodone Nausea Only    And severe HA  . Penicillins Hives  . Percocet [Oxycodone-Acetaminophen] Nausea Only    And severe HA  . Sulfonamide Derivatives     Past Medical History  Diagnosis Date  . CAD (coronary artery disease)     significant with PTCA, MI in 1996  . Hypertension   . Dyslipidemia   . Rheumatoid arthritis(714.0)   . H/O:  hysterectomy   . Vertigo     ROS: Negative except as per HPI  BP 136/86  Pulse 71  Ht 4\' 11"  (1.499 m)  Wt 156 lb 1.9 oz (70.816 kg)  BMI 31.52 kg/m2  PHYSICAL EXAM: Pt is alert and oriented, NAD HEENT: normal Neck: JVP - normal, carotids 2+= without bruits Lungs: CTA bilaterally CV: RRR without murmur or gallop Abd: soft, NT, Positive BS, no hepatomegaly Ext: no C/C/E, distal pulses intact and equal Skin: warm/dry no rash  ASSESSMENT AND PLAN: 1. Coronary artery disease with remote myocardial infarction. The patient is having no symptoms of angina. She will continue on aspirin and her statin  drug. I will see her back in one year for followup evaluation unless problems arise in the interim.  2. Ischemic cardiomyopathy. LVEF about 45-50% by echo. She is tolerating a low dose of losartan but does have some mild headaches with this. I asked her to try to get through allergy season and continue with losartan. If headaches persist, she may try a drug holiday to see if there is a true association between the losartan and headaches. She does not tolerating low-dose of beta blocker. She has New York Heart Association functional class I symptoms.  3. Hyperlipidemia. Managed by her primary physician.  For followup I will see her back in one year.  Sherren Mocha 06/10/2014 12:05 PM

## 2014-06-10 NOTE — Telephone Encounter (Signed)
Pt is aware to F/u with Dr. Burt Knack in 1 year not Dr. Tamala Julian. Pt to call 2 months in advance to make the yearly appointment. Pt verbalized understanding.

## 2014-06-10 NOTE — Addendum Note (Signed)
Addended by: Lamar Laundry on: 06/10/2014 02:35 PM   Modules accepted: Orders, Medications

## 2014-06-10 NOTE — Telephone Encounter (Signed)
New message         Pt would like to know if she is suppose to keep seeing dr cooper or continue care with dr Tamala Julian per paper work ?

## 2014-09-07 ENCOUNTER — Other Ambulatory Visit: Payer: Self-pay | Admitting: Cardiovascular Disease

## 2015-01-26 ENCOUNTER — Ambulatory Visit: Payer: Self-pay | Admitting: Orthopedic Surgery

## 2015-01-26 NOTE — Progress Notes (Signed)
Preoperative surgical orders have been place into the Epic hospital system for Andrea Ibarra on 01/26/2015, 9:36 AM  by Mickel Crow for surgery on 02-17-15.  Preop Total Hip - Anterior Approach orders including IV Tylenol, and IV Decadron as long as there are no contraindications to the above medications. Arlee Muslim, PA-C

## 2015-02-08 ENCOUNTER — Ambulatory Visit: Payer: Self-pay | Admitting: Orthopedic Surgery
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
## 2015-02-09 ENCOUNTER — Encounter: Payer: Self-pay | Admitting: Cardiovascular Disease

## 2015-02-09 ENCOUNTER — Ambulatory Visit (INDEPENDENT_AMBULATORY_CARE_PROVIDER_SITE_OTHER): Payer: Medicare Other | Admitting: Cardiovascular Disease

## 2015-02-09 VITALS — BP 138/86 | HR 67 | Ht 59.0 in | Wt 155.0 lb

## 2015-02-09 DIAGNOSIS — E7800 Pure hypercholesterolemia, unspecified: Secondary | ICD-10-CM

## 2015-02-09 DIAGNOSIS — I252 Old myocardial infarction: Secondary | ICD-10-CM | POA: Diagnosis not present
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
## 2015-02-09 NOTE — Patient Instructions (Addendum)
Medication Instructions:  Your physician recommends that you continue on your current medications as directed. Please refer to the Current Medication list given to you today.  Labwork: No new orders.  Testing/Procedures: No new orders.   Follow-Up: Your physician wants you to follow-up in: 1 YEAR with Dr Burt Knack.  You will receive a reminder letter in the mail two months in advance. If you don't receive a letter, please call our office to schedule the follow-up appointment.   Any Other Special Instructions Will Be Listed Below (If Applicable).  You are cleared for hip surgery with Dr Maureen Ralphs.

## 2015-02-11 NOTE — Patient Instructions (Addendum)
Andrea Ibarra  02/11/2015   Your procedure is scheduled on: Wednesday 02/17/2015  Report to [facility] Main  Entrance and follow signs to               [place] at  250  PM.  Call this number if you have problems the morning of surgery [phone]   Remember: ONLY 1 PERSON MAY GO WITH YOU TO SHORT STAY TO GET  READY MORNING OF [name].  Do not eat food  :After Midnight.  May have clear liquids from midnight up until 1050 am morning of surgery then nothing until after surgery!     Take these medicines the morning of surgery with A SIP OF WATER: none                               You may not have any metal on your body including hair pins and              piercings  Do not wear jewelry, make-up, lotions, powders or perfumes, deodorant             Do not wear nail polish.  Do not shave  48 hours prior to surgery.              Men may shave face and neck.   Do not bring valuables to the hospital. [name].  Contacts, dentures or bridgework may not be worn into surgery.  Leave suitcase in the car. After surgery it may be brought to your room.     Patients discharged the day of surgery will not be allowed to drive home.  Name and phone number of your driver:  Special Instructions: N/A              Please read over the following fact sheets you were given: _____________________________________________________________________             [facility] - Preparing for Surgery Before surgery, you can play an important role.  Because skin is not sterile, your skin needs to be as free of germs as possible.  You can reduce the number of germs on your skin by washing with CHG (chlorahexidine gluconate) soap before surgery.  CHG is an antiseptic cleaner which kills germs and bonds with the skin to continue killing germs even after washing. Please DO NOT use if you have an allergy to CHG or antibacterial soaps.   If your skin becomes reddened/irritated stop using the CHG and inform your nurse when you arrive at Short Stay. Do not shave (including legs and underarms) for at least 48 hours prior to the first CHG shower.  You may shave your face/neck. Please follow these instructions carefully:  1.  Shower with CHG Soap the night before surgery and the  morning of Surgery.  2.  If you choose to wash your hair, wash your hair first as usual with your  normal  shampoo.  3.  After you shampoo, rinse your hair and body thoroughly to remove the  shampoo.                           4.  Use CHG as you would any other  liquid soap.  You can apply chg directly  to the skin and wash                       Gently with a scrungie or clean washcloth.  5.  Apply the CHG Soap to your body ONLY FROM THE NECK DOWN.   Do not use on face/ open                           Wound or open sores. Avoid contact with eyes, ears mouth and genitals (private parts).                       Wash face,  Genitals (private parts) with your normal soap.             6.  Wash thoroughly, paying special attention to the area where your surgery  will be performed.  7.  Thoroughly rinse your body with warm water from the neck down.  8.  DO NOT shower/wash with your normal soap after using and rinsing off  the CHG Soap.                9.  Pat yourself dry with a clean towel.            10.  Wear clean pajamas.            11.  Place clean sheets on your bed the night of your first shower and do not  sleep with pets. Day of Surgery : Do not apply any lotions/deodorants the morning of surgery.  Please wear clean clothes to the hospital/surgery center.  FAILURE TO FOLLOW THESE INSTRUCTIONS MAY RESULT IN THE CANCELLATION OF YOUR SURGERY PATIENT SIGNATURE_________________________________  NURSE SIGNATURE__________________________________  ________________________________________________________________________   Andrea Ibarra  An incentive  spirometer is a tool that can help keep your lungs clear and active. This tool measures how well you are filling your lungs with each breath. Taking long deep breaths may help reverse or decrease the chance of developing breathing (pulmonary) problems (especially infection) following:  A long period of time when you are unable to move or be active. BEFORE THE PROCEDURE   If the spirometer includes an indicator to show your best effort, your nurse or respiratory therapist will set it to a desired goal.  If possible, sit up straight or lean slightly forward. Try not to slouch.  Hold the incentive spirometer in an upright position. INSTRUCTIONS FOR USE   Sit on the edge of your bed if possible, or sit up as far as you can in bed or on a chair.  Hold the incentive spirometer in an upright position.  Breathe out normally.  Place the mouthpiece in your mouth and seal your lips tightly around it.  Breathe in slowly and as deeply as possible, raising the piston or the ball toward the top of the column.  Hold your breath for 3-5 seconds or for as long as possible. Allow the piston or ball to fall to the bottom of the column.  Remove the mouthpiece from your mouth and breathe out normally.  Rest for a few seconds and repeat Steps 1 through 7 at least 10 times every 1-2 hours when you are awake. Take your time and take a few normal breaths between deep breaths.  The spirometer may include an indicator to show your best effort. Use the indicator as a goal  to work toward during each repetition.  After each set of 10 deep breaths, practice coughing to be sure your lungs are clear. If you have an incision (the cut made at the time of surgery), support your incision when coughing by placing a pillow or rolled up towels firmly against it. Once you are able to get out of bed, walk around indoors and cough well. You may stop using the incentive spirometer when instructed by your caregiver.  RISKS AND  COMPLICATIONS  Take your time so you do not get dizzy or light-headed.  If you are in pain, you may need to take or ask for pain medication before doing incentive spirometry. It is harder to take a deep breath if you are having pain. AFTER USE  Rest and breathe slowly and easily.  It can be helpful to keep track of a log of your progress. Your caregiver can provide you with a simple table to help with this. If you are using the spirometer at home, follow these instructions: [place] IF:   You are having difficultly using the spirometer.  You have trouble using the spirometer as often as instructed.  Your pain medication is not giving enough relief while using the spirometer.  You develop fever of 100.5 F (38.1 C) or higher. SEEK IMMEDIATE MEDICAL CARE IF:   You cough up bloody sputum that had not been present before.  You develop fever of 102 F (38.9 C) or greater.  You develop worsening pain at or near the incision site. MAKE SURE YOU:   Understand these instructions.  Will watch your condition.  Will get help right away if you are not doing well or get worse. Document Released: 01/15/2007 Document Revised: 11/27/2011 Document Reviewed: 03/18/2007 ExitCare Patient Information 2014 ExitCare, [place].   ________________________________________________________________________  WHAT IS A BLOOD TRANSFUSION? Blood Transfusion Information  A transfusion is the replacement of blood or some of its parts. Blood is made up of multiple cells which provide different functions.  Red blood cells carry oxygen and are used for blood loss replacement.  White blood cells fight against infection.  Platelets control bleeding.  Plasma helps clot blood.  Other blood products are available for specialized needs, such as hemophilia or other clotting disorders. BEFORE THE TRANSFUSION  Who gives blood for transfusions?   Healthy volunteers who are fully evaluated to make sure  their blood is safe. This is blood bank blood. Transfusion therapy is the safest it has ever been in the practice of medicine. Before blood is taken from a donor, a complete history is taken to make sure that person has no history of diseases nor engages in risky social behavior (examples are intravenous drug use or sexual activity with multiple partners). The donor's travel history is screened to minimize risk of transmitting infections, such as malaria. The donated blood is tested for signs of infectious diseases, such as HIV and hepatitis. The blood is then tested to be sure it is compatible with you in order to minimize the chance of a transfusion reaction. If you or a relative donates blood, this is often done in anticipation of surgery and is not appropriate for emergency situations. It takes many days to process the donated blood. RISKS AND COMPLICATIONS Although transfusion therapy is very safe and saves many lives, the main dangers of transfusion include:   Getting an infectious disease.  Developing a transfusion reaction. This is an allergic reaction to something in the blood you were given. Every precaution  is taken to prevent this. The decision to have a blood transfusion has been considered carefully by your caregiver before blood is given. Blood is not given unless the benefits outweigh the risks. AFTER THE TRANSFUSION  Right after receiving a blood transfusion, you will usually feel much better and more energetic. This is especially true if your red blood cells have gotten low (anemic). The transfusion raises the level of the red blood cells which carry oxygen, and this usually causes an energy increase.  The nurse administering the transfusion will monitor you carefully for complications. HOME CARE INSTRUCTIONS  No special instructions are needed after a transfusion. You may find your energy is better. Speak with your caregiver about any limitations on activity for underlying diseases  you may have. SEEK MEDICAL CARE IF:   Your condition is not improving after your transfusion.  You develop redness or irritation at the intravenous (IV) site. SEEK IMMEDIATE MEDICAL CARE IF:  Any of the following symptoms occur over the next 12 hours:  Shaking chills.  You have a temperature by mouth above 102 F (38.9 C), not controlled by medicine.  Chest, back, or muscle pain.  People around you feel you are not acting correctly or are confused.  Shortness of breath or difficulty breathing.  Dizziness and fainting.  You get a rash or develop hives.  You have a decrease in urine output.  Your urine turns a dark color or changes to pink, red, or brown. Any of the following symptoms occur over the next 10 days:  You have a temperature by mouth above 102 F (38.9 C), not controlled by medicine.  Shortness of breath.  Weakness after normal activity.  The white part of the eye turns yellow (jaundice).  You have a decrease in the amount of urine or are urinating less often.  Your urine turns a dark color or changes to pink, red, or brown. Document Released: 09/01/2000 Document Revised: 11/27/2011 Document Reviewed: 04/20/2008 ExitCare Patient Information 2014 ExitCare, [place].  _______________________________________________________________________   CLEAR LIQUID DIET   Foods Allowed                                                                     Foods Excluded  Coffee and tea, regular and decaf                             liquids that you cannot  Plain Jell-O in any flavor                                             see through such as: Fruit ices (not with fruit pulp)                                     milk, soups, orange juice  Iced Popsicles  All solid food Carbonated beverages, regular and diet                                    Cranberry, grape and apple juices Sports drinks like Gatorade Lightly seasoned clear broth  or consume(fat free) Sugar, honey syrup  Sample Menu Breakfast                                Lunch                                     Supper Cranberry juice                    Beef broth                            Chicken broth Jell-O                                     Grape juice                           Apple juice Coffee or tea                        Jell-O                                      Popsicle                                                Coffee or tea                        Coffee or tea  _____________________________________________________________________

## 2015-02-12 ENCOUNTER — Encounter (HOSPITAL_COMMUNITY)
Admission: RE | Admit: 2015-02-12 | Discharge: 2015-02-12 | Disposition: A | Discharge status: Home / Self Care | Payer: Medicare Other | Source: Ambulatory Visit | Attending: Orthopedic Surgery | Admitting: Orthopedic Surgery

## 2015-02-12 ENCOUNTER — Encounter (HOSPITAL_COMMUNITY): Payer: Self-pay

## 2015-02-12 DIAGNOSIS — M1612 Unilateral primary osteoarthritis, left hip: Secondary | ICD-10-CM | POA: Insufficient documentation

## 2015-02-12 DIAGNOSIS — Z01818 Encounter for other preprocedural examination: Secondary | ICD-10-CM | POA: Diagnosis present

## 2015-02-12 HISTORY — DX: Acute myocardial infarction, unspecified: I21.9

## 2015-02-12 HISTORY — DX: Unspecified asthma, uncomplicated: J45.909

## 2015-02-12 HISTORY — DX: Pneumonia, unspecified organism: J18.9

## 2015-02-12 HISTORY — DX: Fibromyalgia: M79.7

## 2015-02-12 HISTORY — DX: Nausea with vomiting, unspecified: R11.2

## 2015-02-12 HISTORY — DX: Headache: R51

## 2015-02-12 HISTORY — DX: Other specified postprocedural states: Z98.890

## 2015-02-12 HISTORY — DX: Headache, unspecified: R51.9

## 2015-02-12 LAB — COMPREHENSIVE METABOLIC PANEL
ALBUMIN: 4.3 g/dL (ref 3.5–5.0)
ALT: 20 U/L (ref 14–54)
ANION GAP: 7 (ref 5–15)
AST: 32 U/L (ref 15–41)
Alkaline Phosphatase: 70 U/L (ref 38–126)
BUN: 16 mg/dL (ref 6–20)
CALCIUM: 9.9 mg/dL (ref 8.9–10.3)
CO2: 29 mmol/L (ref 22–32)
Chloride: 107 mmol/L (ref 101–111)
Creatinine, Ser: 0.68 mg/dL (ref 0.44–1.00)
GFR calc Af Amer: >60 mL/min (ref >60)
GFR calc non Af Amer: >60 mL/min (ref >60)
Glucose, Bld: 93 mg/dL (ref 65–99)
Potassium: 5.1 mmol/L (ref 3.5–5.1)
Sodium: 143 mmol/L (ref 135–145)
Total Bilirubin: 0.8 mg/dL (ref 0.3–1.2)
Total Protein: 7 g/dL (ref 6.5–8.1)

## 2015-02-12 LAB — CBC
HCT: 42.6 % (ref 36.0–46.0)
Hemoglobin: 13.9 g/dL (ref 12.0–15.0)
MCH: 30.9 pg (ref 26.0–34.0)
MCHC: 32.6 g/dL (ref 30.0–36.0)
MCV: 94.7 fL (ref 78.0–100.0)
Platelets: 182 10*3/uL (ref 150–400)
RBC: 4.5 MIL/uL (ref 3.87–5.11)
RDW: 12.9 % (ref 11.5–15.5)
WBC: 7 10*3/uL (ref 4.0–10.5)

## 2015-02-12 LAB — URINALYSIS, ROUTINE W REFLEX MICROSCOPIC
Bilirubin Urine: NEGATIVE
Glucose, UA: NEGATIVE mg/dL
Hgb urine dipstick: NEGATIVE
Ketones, ur: NEGATIVE mg/dL
Leukocytes, UA: NEGATIVE
NITRITE: NEGATIVE
PROTEIN: NEGATIVE mg/dL
Specific Gravity, Urine: 1.007 (ref 1.005–1.030)
UROBILINOGEN UA: 0.2 mg/dL (ref 0.0–1.0)
pH: 6.5 (ref 5.0–8.0)

## 2015-02-12 LAB — APTT: aPTT: 27 seconds (ref 24–37)

## 2015-02-12 LAB — SURGICAL PCR SCREEN
MRSA, PCR: NEGATIVE
Staphylococcus aureus: NEGATIVE

## 2015-02-12 LAB — PROTIME-INR
INR: 0.97 (ref 0.00–1.49)
Prothrombin Time: 13.1 seconds (ref 11.6–15.2)

## 2015-02-12 NOTE — Progress Notes (Signed)
   02/12/15 1441  OBSTRUCTIVE SLEEP APNEA  Have you ever been diagnosed with sleep apnea through a sleep study? No  Do you snore loudly (loud enough to be heard through closed doors)?  1  Do you often feel tired, fatigued, or sleepy during the daytime? 1  Has anyone observed you stop breathing during your sleep? 0  Do you have, or are you being treated for high blood pressure? 1  BMI more than 35 kg/m2? 0  Age over 74 years old? 1  Neck circumference greater than 40 cm/16 inches? 0  Gender: 0

## 2015-02-17 ENCOUNTER — Encounter (HOSPITAL_COMMUNITY): Payer: Self-pay | Admitting: *Deleted

## 2015-02-17 ENCOUNTER — Inpatient Hospital Stay (HOSPITAL_COMMUNITY): Payer: Medicare Other | Admitting: Anesthesiology

## 2015-02-17 ENCOUNTER — Inpatient Hospital Stay (HOSPITAL_COMMUNITY)
Admission: RE | Admit: 2015-02-17 | Discharge: 2015-02-20 | DRG: 470 | Disposition: A | Discharge status: Home Health | Payer: Medicare Other | Source: Ambulatory Visit | Attending: Orthopedic Surgery | Admitting: Orthopedic Surgery

## 2015-02-17 ENCOUNTER — Inpatient Hospital Stay (HOSPITAL_COMMUNITY): Payer: Medicare Other

## 2015-02-17 ENCOUNTER — Encounter (HOSPITAL_COMMUNITY)
Admission: RE | Disposition: A | Discharge status: Home Health | Payer: Self-pay | Source: Ambulatory Visit | Attending: Orthopedic Surgery

## 2015-02-17 DIAGNOSIS — Z87891 Personal history of nicotine dependence: Secondary | ICD-10-CM | POA: Diagnosis not present

## 2015-02-17 DIAGNOSIS — Z96651 Presence of right artificial knee joint: Secondary | ICD-10-CM | POA: Diagnosis present

## 2015-02-17 DIAGNOSIS — Z01812 Encounter for preprocedural laboratory examination: Secondary | ICD-10-CM

## 2015-02-17 DIAGNOSIS — Z833 Family history of diabetes mellitus: Secondary | ICD-10-CM

## 2015-02-17 DIAGNOSIS — I252 Old myocardial infarction: Secondary | ICD-10-CM

## 2015-02-17 DIAGNOSIS — Z79899 Other long term (current) drug therapy: Secondary | ICD-10-CM | POA: Diagnosis not present

## 2015-02-17 DIAGNOSIS — M1612 Unilateral primary osteoarthritis, left hip: Principal | ICD-10-CM | POA: Diagnosis present

## 2015-02-17 DIAGNOSIS — Z90710 Acquired absence of both cervix and uterus: Secondary | ICD-10-CM

## 2015-02-17 DIAGNOSIS — I2510 Atherosclerotic heart disease of native coronary artery without angina pectoris: Secondary | ICD-10-CM | POA: Diagnosis present

## 2015-02-17 DIAGNOSIS — E785 Hyperlipidemia, unspecified: Secondary | ICD-10-CM | POA: Diagnosis present

## 2015-02-17 DIAGNOSIS — G629 Polyneuropathy, unspecified: Secondary | ICD-10-CM | POA: Diagnosis not present

## 2015-02-17 DIAGNOSIS — Z96649 Presence of unspecified artificial hip joint: Secondary | ICD-10-CM

## 2015-02-17 DIAGNOSIS — Z7982 Long term (current) use of aspirin: Secondary | ICD-10-CM

## 2015-02-17 DIAGNOSIS — Z8261 Family history of arthritis: Secondary | ICD-10-CM

## 2015-02-17 DIAGNOSIS — J45909 Unspecified asthma, uncomplicated: Secondary | ICD-10-CM | POA: Diagnosis present

## 2015-02-17 DIAGNOSIS — Z8249 Family history of ischemic heart disease and other diseases of the circulatory system: Secondary | ICD-10-CM | POA: Diagnosis not present

## 2015-02-17 DIAGNOSIS — M25552 Pain in left hip: Secondary | ICD-10-CM | POA: Diagnosis present

## 2015-02-17 DIAGNOSIS — M797 Fibromyalgia: Secondary | ICD-10-CM | POA: Diagnosis present

## 2015-02-17 DIAGNOSIS — M169 Osteoarthritis of hip, unspecified: Secondary | ICD-10-CM | POA: Diagnosis present

## 2015-02-17 HISTORY — PX: TOTAL HIP ARTHROPLASTY: SHX124

## 2015-02-17 LAB — TYPE AND SCREEN
ABO/RH(D): AB POS
Antibody Screen: NEGATIVE

## 2015-02-17 SURGERY — ARTHROPLASTY, HIP, TOTAL, ANTERIOR APPROACH
Anesthesia: Monitor Anesthesia Care | Site: Hip | Laterality: Left

## 2015-02-17 MED ORDER — METOCLOPRAMIDE HCL 10 MG PO TABS: 5.0000 mg | ORAL_TABLET | Freq: Three times a day (TID) | ORAL | Status: DC | PRN

## 2015-02-17 MED ORDER — HYDROMORPHONE HCL 1 MG/ML IJ SOLN
0.2500 mg | INTRAMUSCULAR | Status: DC | PRN
  Administered 2015-02-17 (×2): 0.5 mg via INTRAVENOUS
  Administered 2015-02-17: 0.25 mg via INTRAVENOUS

## 2015-02-17 MED ORDER — SCOPOLAMINE 1 MG/3DAYS TD PT72
1.0000 | MEDICATED_PATCH | TRANSDERMAL | Status: DC
  Administered 2015-02-17: 1.5 mg via TRANSDERMAL
  Filled 2015-02-17: qty 1

## 2015-02-17 MED ORDER — TRANEXAMIC ACID 1000 MG/10ML IV SOLN
2000.0000 mg | INTRAVENOUS | Status: DC | PRN
  Administered 2015-02-17: 2000 mg via TOPICAL

## 2015-02-17 MED ORDER — SODIUM CHLORIDE 0.9 % IJ SOLN
INTRAMUSCULAR | Status: AC
  Filled 2015-02-17: qty 10

## 2015-02-17 MED ORDER — LACTATED RINGERS IV SOLN
INTRAVENOUS | Status: DC
  Administered 2015-02-17 (×3): via INTRAVENOUS

## 2015-02-17 MED ORDER — VANCOMYCIN HCL IN DEXTROSE 1-5 GM/200ML-% IV SOLN
1000.0000 mg | Freq: Two times a day (BID) | INTRAVENOUS | Status: AC
  Administered 2015-02-18: 1000 mg via INTRAVENOUS
  Filled 2015-02-17: qty 200

## 2015-02-17 MED ORDER — TRANEXAMIC ACID 1000 MG/10ML IV SOLN
2000.0000 mg | Freq: Once | INTRAVENOUS | Status: DC
  Filled 2015-02-17: qty 20

## 2015-02-17 MED ORDER — ONDANSETRON HCL 4 MG/2ML IJ SOLN
INTRAMUSCULAR | Status: AC
  Filled 2015-02-17: qty 2

## 2015-02-17 MED ORDER — PROPOFOL 10 MG/ML IV BOLUS
INTRAVENOUS | Status: AC
  Filled 2015-02-17: qty 20

## 2015-02-17 MED ORDER — PHENOL 1.4 % MT LIQD
1.0000 | OROMUCOSAL | Status: DC | PRN
  Filled 2015-02-17: qty 177

## 2015-02-17 MED ORDER — SODIUM CHLORIDE 0.9 % IV SOLN
INTRAVENOUS | Status: DC
  Administered 2015-02-17: 1000 mL via INTRAVENOUS
  Administered 2015-02-18: 02:00:00 via INTRAVENOUS

## 2015-02-17 MED ORDER — TRAMADOL HCL 50 MG PO TABS: 50.0000 mg | ORAL_TABLET | Freq: Four times a day (QID) | ORAL | Status: DC | PRN

## 2015-02-17 MED ORDER — HYDROMORPHONE HCL 1 MG/ML IJ SOLN
INTRAMUSCULAR | Status: AC
  Filled 2015-02-17: qty 1

## 2015-02-17 MED ORDER — CHLORHEXIDINE GLUCONATE 4 % EX LIQD: 60.0000 mL | Freq: Once | CUTANEOUS | Status: DC

## 2015-02-17 MED ORDER — METOCLOPRAMIDE HCL 5 MG/ML IJ SOLN: 5.0000 mg | Freq: Three times a day (TID) | INTRAMUSCULAR | Status: DC | PRN

## 2015-02-17 MED ORDER — EPHEDRINE SULFATE 50 MG/ML IJ SOLN
INTRAMUSCULAR | Status: AC
Start: 2015-02-17 — End: 2015-02-17
  Filled 2015-02-17: qty 1

## 2015-02-17 MED ORDER — DEXAMETHASONE SODIUM PHOSPHATE 10 MG/ML IJ SOLN
10.0000 mg | Freq: Once | INTRAMUSCULAR | Status: AC
  Administered 2015-02-18: 10 mg via INTRAVENOUS
  Filled 2015-02-17: qty 1

## 2015-02-17 MED ORDER — FLUTICASONE PROPIONATE 50 MCG/ACT NA SUSP
2.0000 | Freq: Every morning | NASAL | Status: DC
  Filled 2015-02-17: qty 16

## 2015-02-17 MED ORDER — BUPIVACAINE IN DEXTROSE 0.75-8.25 % IT SOLN
INTRATHECAL | Status: DC | PRN
  Administered 2015-02-17: 1.6 mL via INTRATHECAL

## 2015-02-17 MED ORDER — MENTHOL 3 MG MT LOZG: 1.0000 | LOZENGE | OROMUCOSAL | Status: DC | PRN

## 2015-02-17 MED ORDER — DEXAMETHASONE SODIUM PHOSPHATE 10 MG/ML IJ SOLN
10.0000 mg | Freq: Once | INTRAMUSCULAR | Status: AC
  Administered 2015-02-17: 10 mg via INTRAVENOUS

## 2015-02-17 MED ORDER — BUPIVACAINE-EPINEPHRINE 0.25% -1:200000 IJ SOLN
INTRAMUSCULAR | Status: DC | PRN
Start: 2015-02-17 — End: 2015-02-17
  Administered 2015-02-17: 30 mL

## 2015-02-17 MED ORDER — BUPIVACAINE-EPINEPHRINE (PF) 0.25% -1:200000 IJ SOLN
INTRAMUSCULAR | Status: AC
  Filled 2015-02-17: qty 30

## 2015-02-17 MED ORDER — SCOPOLAMINE 1 MG/3DAYS TD PT72
MEDICATED_PATCH | TRANSDERMAL | Status: AC
  Filled 2015-02-17: qty 1

## 2015-02-17 MED ORDER — FLEET ENEMA 7-19 GM/118ML RE ENEM: 1.0000 | ENEMA | Freq: Once | RECTAL | Status: AC | PRN

## 2015-02-17 MED ORDER — DIPHENHYDRAMINE HCL 12.5 MG/5ML PO ELIX: 12.5000 mg | ORAL_SOLUTION | ORAL | Status: DC | PRN

## 2015-02-17 MED ORDER — POLYETHYLENE GLYCOL 3350 17 G PO PACK
17.0000 g | PACK | Freq: Every day | ORAL | Status: DC | PRN
  Administered 2015-02-19 – 2015-02-20 (×2): 17 g via ORAL
  Filled 2015-02-17 (×2): qty 1

## 2015-02-17 MED ORDER — TRAZODONE 25 MG HALF TABLET
25.0000 mg | ORAL_TABLET | Freq: Every day | ORAL | Status: DC
  Administered 2015-02-17 – 2015-02-19 (×3): 25 mg via ORAL
  Filled 2015-02-17 (×4): qty 1

## 2015-02-17 MED ORDER — ACETAMINOPHEN 10 MG/ML IV SOLN
1000.0000 mg | Freq: Once | INTRAVENOUS | Status: AC
  Administered 2015-02-17: 1000 mg via INTRAVENOUS
  Filled 2015-02-17: qty 100

## 2015-02-17 MED ORDER — METHOCARBAMOL 500 MG PO TABS
500.0000 mg | ORAL_TABLET | Freq: Four times a day (QID) | ORAL | Status: DC | PRN
  Administered 2015-02-19 (×3): 500 mg via ORAL
  Filled 2015-02-17 (×3): qty 1

## 2015-02-17 MED ORDER — ACETAMINOPHEN 650 MG RE SUPP: 650.0000 mg | Freq: Four times a day (QID) | RECTAL | Status: DC | PRN

## 2015-02-17 MED ORDER — ONDANSETRON HCL 4 MG/2ML IJ SOLN: 4.0000 mg | Freq: Four times a day (QID) | INTRAMUSCULAR | Status: DC | PRN

## 2015-02-17 MED ORDER — ACETAMINOPHEN 325 MG PO TABS: 650.0000 mg | ORAL_TABLET | Freq: Four times a day (QID) | ORAL | Status: DC | PRN

## 2015-02-17 MED ORDER — PROPOFOL INFUSION 10 MG/ML OPTIME
INTRAVENOUS | Status: DC | PRN
Start: 2015-02-17 — End: 2015-02-17
  Administered 2015-02-17: 50 ug/kg/min via INTRAVENOUS

## 2015-02-17 MED ORDER — SODIUM CHLORIDE 0.9 % IV SOLN: INTRAVENOUS | Status: DC

## 2015-02-17 MED ORDER — HYDROMORPHONE HCL 2 MG PO TABS
2.0000 mg | ORAL_TABLET | ORAL | Status: DC | PRN
  Administered 2015-02-17 – 2015-02-18 (×2): 2 mg via ORAL
  Administered 2015-02-18 – 2015-02-20 (×7): 4 mg via ORAL
  Filled 2015-02-17 (×4): qty 2
  Filled 2015-02-17: qty 1
  Filled 2015-02-17 (×2): qty 2
  Filled 2015-02-17: qty 1
  Filled 2015-02-17: qty 2

## 2015-02-17 MED ORDER — BISACODYL 10 MG RE SUPP
10.0000 mg | Freq: Every day | RECTAL | Status: DC | PRN
  Filled 2015-02-17: qty 1

## 2015-02-17 MED ORDER — METHOCARBAMOL 1000 MG/10ML IJ SOLN
500.0000 mg | Freq: Four times a day (QID) | INTRAVENOUS | Status: DC | PRN
  Administered 2015-02-17: 500 mg via INTRAVENOUS
  Filled 2015-02-17 (×2): qty 5

## 2015-02-17 MED ORDER — PHENYLEPHRINE HCL 10 MG/ML IJ SOLN
INTRAMUSCULAR | Status: DC | PRN
  Administered 2015-02-17 (×3): 40 ug via INTRAVENOUS

## 2015-02-17 MED ORDER — PROMETHAZINE HCL 25 MG/ML IJ SOLN
6.2500 mg | INTRAMUSCULAR | Status: DC | PRN
  Administered 2015-02-17: 6.25 mg via INTRAVENOUS

## 2015-02-17 MED ORDER — MIDAZOLAM HCL 2 MG/2ML IJ SOLN
INTRAMUSCULAR | Status: AC
  Filled 2015-02-17: qty 2

## 2015-02-17 MED ORDER — ONDANSETRON HCL 4 MG PO TABS: 4.0000 mg | ORAL_TABLET | Freq: Four times a day (QID) | ORAL | Status: DC | PRN

## 2015-02-17 MED ORDER — ROSUVASTATIN CALCIUM 10 MG PO TABS
10.0000 mg | ORAL_TABLET | Freq: Every day | ORAL | Status: DC
  Administered 2015-02-17 – 2015-02-19 (×3): 10 mg via ORAL
  Filled 2015-02-17 (×4): qty 1

## 2015-02-17 MED ORDER — VANCOMYCIN HCL IN DEXTROSE 1-5 GM/200ML-% IV SOLN
1000.0000 mg | INTRAVENOUS | Status: AC
  Administered 2015-02-17: 1000 mg via INTRAVENOUS
  Filled 2015-02-17: qty 200

## 2015-02-17 MED ORDER — DOCUSATE SODIUM 100 MG PO CAPS
100.0000 mg | ORAL_CAPSULE | Freq: Two times a day (BID) | ORAL | Status: DC
  Administered 2015-02-17 – 2015-02-20 (×6): 100 mg via ORAL

## 2015-02-17 MED ORDER — MIDAZOLAM HCL 5 MG/5ML IJ SOLN
INTRAMUSCULAR | Status: DC | PRN
  Administered 2015-02-17 (×2): 1 mg via INTRAVENOUS

## 2015-02-17 MED ORDER — PROMETHAZINE HCL 25 MG/ML IJ SOLN
INTRAMUSCULAR | Status: AC
  Filled 2015-02-17: qty 1

## 2015-02-17 MED ORDER — LIDOCAINE HCL (CARDIAC) 20 MG/ML IV SOLN
INTRAVENOUS | Status: AC
  Filled 2015-02-17: qty 5

## 2015-02-17 MED ORDER — DEXAMETHASONE SODIUM PHOSPHATE 10 MG/ML IJ SOLN
INTRAMUSCULAR | Status: AC
  Filled 2015-02-17: qty 1

## 2015-02-17 MED ORDER — HYDROMORPHONE HCL 1 MG/ML IJ SOLN: 0.5000 mg | INTRAMUSCULAR | Status: DC | PRN

## 2015-02-17 MED ORDER — ACETAMINOPHEN 500 MG PO TABS
1000.0000 mg | ORAL_TABLET | Freq: Four times a day (QID) | ORAL | Status: AC
  Administered 2015-02-17 – 2015-02-18 (×4): 1000 mg via ORAL
  Filled 2015-02-17 (×5): qty 2

## 2015-02-17 MED ORDER — LOSARTAN POTASSIUM 25 MG PO TABS
25.0000 mg | ORAL_TABLET | Freq: Every day | ORAL | Status: DC
  Administered 2015-02-17 – 2015-02-20 (×3): 25 mg via ORAL
  Filled 2015-02-17 (×4): qty 1

## 2015-02-17 MED ORDER — LORATADINE 10 MG PO TABS
10.0000 mg | ORAL_TABLET | Freq: Every day | ORAL | Status: DC
  Administered 2015-02-17 – 2015-02-20 (×4): 10 mg via ORAL
  Filled 2015-02-17 (×4): qty 1

## 2015-02-17 MED ORDER — RIVAROXABAN 10 MG PO TABS
10.0000 mg | ORAL_TABLET | Freq: Every day | ORAL | Status: DC
  Administered 2015-02-18 – 2015-02-20 (×3): 10 mg via ORAL
  Filled 2015-02-17 (×5): qty 1

## 2015-02-17 SURGICAL SUPPLY — 40 items
BAG DECANTER FOR FLEXI CONT (MISCELLANEOUS) ×2 IMPLANT
BAG ZIPLOCK 12X15 (MISCELLANEOUS) IMPLANT
BLADE EXTENDED COATED 6.5IN (ELECTRODE) ×2 IMPLANT
BLADE SAG 18X100X1.27 (BLADE) ×2 IMPLANT
CAPT HIP TOTAL 2 ×2 IMPLANT
COVER PERINEAL POST (MISCELLANEOUS) ×2 IMPLANT
DECANTER SPIKE VIAL GLASS SM (MISCELLANEOUS) IMPLANT
DRAPE C-ARM 42X120 X-RAY (DRAPES) ×2 IMPLANT
DRAPE STERI IOBAN 125X83 (DRAPES) ×2 IMPLANT
DRAPE U-SHAPE 47X51 STRL (DRAPES) ×6 IMPLANT
DRSG ADAPTIC 3X8 NADH LF (GAUZE/BANDAGES/DRESSINGS) ×2 IMPLANT
DRSG MEPILEX BORDER 4X4 (GAUZE/BANDAGES/DRESSINGS) ×2 IMPLANT
DRSG MEPILEX BORDER 4X8 (GAUZE/BANDAGES/DRESSINGS) ×2 IMPLANT
DURAPREP 26ML APPLICATOR (WOUND CARE) ×2 IMPLANT
ELECT REM PT RETURN 9FT ADLT (ELECTROSURGICAL) ×2
ELECTRODE REM PT RTRN 9FT ADLT (ELECTROSURGICAL) ×1 IMPLANT
EVACUATOR 1/8 PVC DRAIN (DRAIN) ×2 IMPLANT
FACESHIELD WRAPAROUND (MASK) ×6 IMPLANT
GLOVE BIO SURGEON STRL SZ7.5 (GLOVE) ×2 IMPLANT
GLOVE BIO SURGEON STRL SZ8 (GLOVE) ×2 IMPLANT
GLOVE BIOGEL PI IND STRL 8 (GLOVE) ×2 IMPLANT
GLOVE BIOGEL PI INDICATOR 8 (GLOVE) ×2
GOWN STRL REUS W/TWL LRG LVL3 (GOWN DISPOSABLE) ×2 IMPLANT
GOWN STRL REUS W/TWL XL LVL3 (GOWN DISPOSABLE) ×2 IMPLANT
KIT BASIN OR (CUSTOM PROCEDURE TRAY) ×2 IMPLANT
NDL SAFETY ECLIPSE 18X1.5 (NEEDLE) ×2 IMPLANT
NEEDLE HYPO 18GX1.5 SHARP (NEEDLE) ×2
PACK TOTAL JOINT (CUSTOM PROCEDURE TRAY) ×2 IMPLANT
PEN SKIN MARKING BROAD (MISCELLANEOUS) ×2 IMPLANT
STRIP CLOSURE SKIN 1/2X4 (GAUZE/BANDAGES/DRESSINGS) ×4 IMPLANT
SUT ETHIBOND NAB CT1 #1 30IN (SUTURE) ×2 IMPLANT
SUT MNCRL AB 4-0 PS2 18 (SUTURE) ×2 IMPLANT
SUT VIC AB 2-0 CT1 27 (SUTURE) ×3
SUT VIC AB 2-0 CT1 TAPERPNT 27 (SUTURE) ×3 IMPLANT
SUT VLOC 180 0 24IN GS25 (SUTURE) ×2 IMPLANT
SYR 30ML LL (SYRINGE) ×2 IMPLANT
SYR 50ML LL SCALE MARK (SYRINGE) ×2 IMPLANT
TOWEL OR 17X26 10 PK STRL BLUE (TOWEL DISPOSABLE) ×2 IMPLANT
TOWEL OR NON WOVEN STRL DISP B (DISPOSABLE) ×2 IMPLANT
TRAY FOLEY W/METER SILVER 14FR (SET/KITS/TRAYS/PACK) ×2 IMPLANT

## 2015-02-17 NOTE — Op Note (Signed)
OPERATIVE REPORT  PREOPERATIVE DIAGNOSIS: Osteoarthritis of the Left hip.   POSTOPERATIVE DIAGNOSIS: Osteoarthritis of the Left  hip.   PROCEDURE: Left total hip arthroplasty, anterior approach.   SURGEON: Gaynelle Arabian, MD   ASSISTANT: Arlee Muslim, PA-C  ANESTHESIA:  Spinal  ESTIMATED BLOOD LOSS:-300 ml   DRAINS: Hemovac x1.   COMPLICATIONS: None   CONDITION: PACU - hemodynamically stable.   BRIEF CLINICAL NOTE: Andrea Ibarra is a 74 y.o. female who has advanced end-  stage arthritis of her Left  hip with progressively worsening pain and  dysfunction.The patient has failed nonoperative management and presents for  total hip arthroplasty.   PROCEDURE IN DETAIL: After successful administration of spinal  anesthetic, the traction boots for the [facility] bed were placed on both  feet and the patient was placed onto the [facility] bed, boots placed into the leg  holders. The Left hip was then isolated from the perineum with plastic  drapes and prepped and draped in the usual sterile fashion. ASIS and  greater trochanter were marked and a oblique incision was made, starting  at about 1 cm lateral and 2 cm distal to the ASIS and coursing towards  the anterior cortex of the femur. The skin was cut with a 10 blade  through subcutaneous tissue to the level of the fascia overlying the  tensor fascia lata muscle. The fascia was then incised in line with the  incision at the junction of the anterior third and posterior 2/3rd. The  muscle was teased off the fascia and then the interval between the TFL  and the rectus was developed. The Hohmann retractor was then placed at  the top of the femoral neck over the capsule. The vessels overlying the  capsule were cauterized and the fat on top of the capsule was removed.  A Hohmann retractor was then placed anterior underneath the rectus  femoris to give exposure to the entire anterior capsule. A T-shaped  capsulotomy was performed. The edges  were tagged and the femoral head  was identified.       Osteophytes are removed off the superior acetabulum.  The femoral neck was then cut in situ with an oscillating saw. Traction  was then applied to the left lower extremity utilizing the [facility]  traction. The femoral head was then removed. Retractors were placed  around the acetabulum and then circumferential removal of the labrum was  performed. Osteophytes were also removed. Reaming starts at 43 mm to  medialize and  Increased in 2 mm increments to 47 mm. We reamed in  approximately 40 degrees of abduction, 20 degrees anteversion. A 48 mm  pinnacle acetabular shell was then impacted in anatomic position under  fluoroscopic guidance with excellent purchase. We did not need to place  any additional dome screws. A 28 mm neutral + 4 marathon liner was then  placed into the acetabular shell.       The femoral lift was then placed along the lateral aspect of the femur  just distal to the vastus ridge. The leg was  externally rotated and capsule  was stripped off the inferior aspect of the femoral neck down to the  level of the lesser trochanter, this was done with electrocautery. The femur was lifted after this was performed. The  leg was then placed and extended in adducted position to essentially delivering the femur. We also removed the capsule superiorly and the  piriformis from the piriformis fossa  to gain excellent exposure of the  proximal femur. Rongeur was used to remove some cancellous bone to get  into the lateral portion of the proximal femur for placement of the  initial starter reamer. The starter broaches was placed  the starter broach  and was shown to go down the center of the canal. Broaching  with the  Corail system was then performed starting at size 8, coursing  Up to size 9. A size 9 had excellent torsional and rotational  and axial stability. The trial standard offset neck was then placed  with a 28 + 1.5 trial head.  The hip was then reduced. We confirmed that  the stem was in the canal both on AP and lateral x-rays. It also has excellent sizing. The hip was reduced with outstanding stability through full extension, full external rotation,  and then flexion in adduction internal rotation. AP pelvis was taken  and the leg lengths were measured and found to be exactly equal. Hip  was then dislocated again and the femoral head and neck removed. The  femoral broach was removed. Size 9 Corail stem with a standard offset  neck was then impacted into the femur following native anteversion. Has  excellent purchase in the canal. Excellent torsional and rotational and  axial stability. It is confirmed to be in the canal on AP and lateral  fluoroscopic views. The 28 + 1.5 ceramic head was placed and the hip  reduced with outstanding stability. Again AP pelvis was taken and it  confirmed that the leg lengths were equal. The wound was then copiously  irrigated with saline solution and the capsule reattached and repaired  with Ethibond suture.  20 mL of Exparel mixed with 50 mL of saline then additional 20 ml of .25% Bupivicaine injected into the capsule and into the edge of the tensor fascia lata as well as subcutaneous tissue. The fascia overlying the tensor fascia lata was  then closed with a running #1 V-Loc. Subcu was closed with interrupted  2-0 Vicryl and subcuticular running 4-0 Monocryl. Incision was cleaned  and dried. Steri-Strips and a bulky sterile dressing applied. Hemovac  drain was hooked to suction and then he was awakened and transported to  recovery in stable condition.        Please note that a surgical assistant was a medical necessity for this procedure to perform it in a safe and expeditious manner. Assistant was necessary to provide appropriate retraction of vital neurovascular structures and to prevent femoral fracture and allow for anatomic placement of the prosthesis.  Gaynelle Arabian, M.D.

## 2015-02-17 NOTE — Anesthesia Procedure Notes (Signed)
Spinal Patient location during procedure: OR Start time: 02/17/2015 4:37 PM End time: 02/17/2015 4:43 PM Staffing Resident/CRNA: Chrystine Oiler G Performed by: resident/CRNA  Preanesthetic Checklist Completed: patient identified, site marked, surgical consent, pre-op evaluation, timeout performed, IV checked, risks and benefits discussed and monitors and equipment checked Spinal Block Patient position: sitting Prep: Betasept and site prepped and draped Patient monitoring: heart rate, continuous pulse ox and blood pressure Approach: midline Location: L3-4 Injection technique: single-shot Needle Needle type: Spinocan  Needle gauge: 25 G Needle length: 9 cm Needle insertion depth: 5 cm Assessment Sensory level: T6

## 2015-02-17 NOTE — Transfer of Care (Signed)
Immediate Anesthesia Transfer of Care Note  Patient: Andrea Ibarra  Procedure(s) Performed: Procedure(s): LEFT TOTAL HIP ARTHROPLASTY ANTERIOR APPROACH (Left)  Patient Location: PACU  Anesthesia Type:MAC and Spinal  Level of Consciousness: awake, alert  and oriented  Airway & Oxygen Therapy: Patient Spontanous Breathing and Patient connected to face mask oxygen  Post-op Assessment: Report given to RN and Post -op Vital signs reviewed and stable  Post vital signs: Reviewed and stable  Last Vitals:  Filed Vitals:   02/17/15 1409  BP: 127/83  Pulse: 74  Temp: 36.8 C  Resp: 20    Complications: No apparent anesthesia complications

## 2015-02-17 NOTE — Anesthesia Preprocedure Evaluation (Addendum)
Anesthesia Evaluation  Patient identified by MRN, date of birth, ID band Patient awake    Reviewed: Allergy & Precautions, NPO status , Patient's Chart, lab work & pertinent test results  History of Anesthesia Complications (+) PONV and history of anesthetic complications  Airway Mallampati: II  TM Distance: >3 FB Neck ROM: Full    Dental no notable dental hx. (+) Dental Advisory Given   Pulmonary asthma , former smoker,    Pulmonary exam normal       Cardiovascular hypertension, + CAD and + Past MI Normal cardiovascular exam Study Conclusions  - Left ventricle: The cavity size was normal. Wall thickness was normal. Systolic function was mildly reduced. The estimated ejection fraction was in the range of 45% to 50%. Moderate hypokinesis and scarring of the basal-midinferolateral and inferior myocardium.    Neuro/Psych  Headaches, negative psych ROS   GI/Hepatic negative GI ROS, Neg liver ROS,   Endo/Other  negative endocrine ROS  Renal/GU negative Renal ROS     Musculoskeletal   Abdominal   Peds  Hematology   Anesthesia Other Findings   Reproductive/Obstetrics                         Anesthesia Physical Anesthesia Plan  ASA: III  Anesthesia Plan: MAC and Spinal   Post-op Pain Management:    Induction:   Airway Management Planned: Simple Face Mask  Additional Equipment:   Intra-op Plan:   Post-operative Plan:   Informed Consent: I have reviewed the patients History and Physical, chart, labs and discussed the procedure including the risks, benefits and alternatives for the proposed anesthesia with the patient or authorized representative who has indicated his/her understanding and acceptance.   Dental advisory given  Plan Discussed with: CRNA, Anesthesiologist and Surgeon  Anesthesia Plan Comments:        Anesthesia Quick Evaluation
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
## 2015-02-17 NOTE — Interval H&P Note (Signed)
History and Physical Interval Note:  02/17/2015 3:50 PM  Andrea Ibarra  has presented today for surgery, with the diagnosis of OA LEFT HIP  The various methods of treatment have been discussed with the patient and family. After consideration of risks, benefits and other options for treatment, the patient has consented to  Procedure(s): LEFT TOTAL HIP ARTHROPLASTY ANTERIOR APPROACH (Left) as a surgical intervention .  The patient's history has been reviewed, patient examined, no change in status, stable for surgery.  I have reviewed the patient's chart and labs.  Questions were answered to the patient's satisfaction.     [name]

## 2015-02-17 NOTE — Anesthesia Postprocedure Evaluation (Signed)
Anesthesia Post Note  Patient: Andrea Ibarra  Procedure(s) Performed: Procedure(s) (LRB): LEFT TOTAL HIP ARTHROPLASTY ANTERIOR APPROACH (Left)  Anesthesia type: MAC/SAB  Patient location: PACU  Post pain: Pain level controlled  Post assessment: Patient's Cardiovascular Status Stable  Last Vitals:  Filed Vitals:   02/17/15 2024  BP: 133/63  Pulse: 74  Temp: 36.6 C  Resp: 12    Post vital signs: Reviewed and stable  Level of consciousness: sedated  Complications: No apparent anesthesia complications

## 2015-02-18 ENCOUNTER — Encounter (HOSPITAL_COMMUNITY): Payer: Self-pay | Admitting: Orthopedic Surgery

## 2015-02-18 LAB — BASIC METABOLIC PANEL
ANION GAP: 5 (ref 5–15)
BUN: 12 mg/dL (ref 6–20)
CO2: 23 mmol/L (ref 22–32)
Calcium: 8.4 mg/dL — ABNORMAL LOW (ref 8.9–10.3)
Chloride: 109 mmol/L (ref 101–111)
Creatinine, Ser: 0.71 mg/dL (ref 0.44–1.00)
GFR calc Af Amer: >60 mL/min (ref >60)
Glucose, Bld: 202 mg/dL — ABNORMAL HIGH (ref 65–99)
POTASSIUM: 4.9 mmol/L (ref 3.5–5.1)
SODIUM: 137 mmol/L (ref 135–145)

## 2015-02-18 LAB — CBC
HCT: 36.3 % (ref 36.0–46.0)
HEMOGLOBIN: 11.9 g/dL — AB (ref 12.0–15.0)
MCH: 30.8 pg (ref 26.0–34.0)
MCHC: 32.8 g/dL (ref 30.0–36.0)
MCV: 94 fL (ref 78.0–100.0)
Platelets: 151 10*3/uL (ref 150–400)
RBC: 3.86 MIL/uL — ABNORMAL LOW (ref 3.87–5.11)
RDW: 12.9 % (ref 11.5–15.5)
WBC: 9.6 10*3/uL (ref 4.0–10.5)

## 2015-02-18 MED ORDER — SODIUM CHLORIDE 0.9 % IV BOLUS (SEPSIS)
250.0000 mL | Freq: Once | INTRAVENOUS | Status: AC
  Administered 2015-02-18: 250 mL via INTRAVENOUS

## 2015-02-18 NOTE — Plan of Care (Signed)
Problem: Consults Goal: Diagnosis- Total Joint Replacement Outcome: Completed/Met Date Met:  02/18/15 Primary Total Hip LEFT, Anterior

## 2015-02-18 NOTE — Progress Notes (Signed)
   Subjective: 1 Day Post-Op Procedure(s) (LRB): LEFT TOTAL HIP ARTHROPLASTY ANTERIOR APPROACH (Left) Andrea Ibarra reports pain as mild.   Andrea Ibarra seen in rounds with Dr. Wynelle Link.  Family in room. Andrea Ibarra is well, but has had some minor complaints of pain in the hip, requiring pain medications We will start therapy today.  Plan is to go home with daughter after hospital stay.  Objective: Vital signs in last 24 hours: Temp:  [97.6 F (36.4 C)-99.1 F (37.3 C)] 98.2 F (36.8 C) (06/02 0501) Pulse Rate:  [63-81] 77 (06/02 0501) Resp:  [11-20] 14 (06/02 0501) BP: (108-138)/(54-83) 108/54 mmHg (06/02 0501) SpO2:  [94 %-100 %] 98 % (06/02 0501) Weight:  [71.215 kg (157 lb)] 71.215 kg (157 lb) (06/01 1409)  Intake/Output from previous day:  Intake/Output Summary (Last 24 hours) at 02/18/15 0757 Last data filed at 02/18/15 0500  Gross per 24 hour  Intake   3755 ml  Output   1342 ml  Net   2413 ml    Intake/Output this shift: UOP 275 since around MN  Labs:  Recent Labs  02/18/15 0415  HGB 11.9*    Recent Labs  02/18/15 0415  WBC 9.6  RBC 3.86*  HCT 36.3  PLT 151    Recent Labs  02/18/15 0415  NA 137  K 4.9  CL 109  CO2 23  BUN 12  CREATININE 0.71  GLUCOSE 202*  CALCIUM 8.4*   No results for input(s): LABPT, INR in the last 72 hours.  EXAM General - Andrea Ibarra is Alert and Appropriate Extremity - Neurovascular intact Sensation intact distally Dorsiflexion/Plantar flexion intact Dressing - dressing C/D/I Motor Function - intact, moving foot and toes well on exam.  Hemovac pulled without difficulty.  Past Medical History  Diagnosis Date  . CAD (coronary artery disease)     significant with PTCA, MI in 1996  . Hypertension   . Dyslipidemia   . H/O: hysterectomy   . Vertigo   . PONV (postoperative nausea and vomiting)   . Myocardial infarction     remotely in 1996  . Asthma     as child  . Pneumonia     as teenager  . Headache     migraines years  ago  . Fibromyalgia   . Rheumatoid arthritis(714.0)     rheumatoid and osteoarthritis    Assessment/Plan: 1 Day Post-Op Procedure(s) (LRB): LEFT TOTAL HIP ARTHROPLASTY ANTERIOR APPROACH (Left) Active Problems:   OA (osteoarthritis) of hip  Estimated body mass index is 31.19 kg/(m^2) as calculated from the following:   Height as of this encounter: 4' 11.5" (1.511 m).   Weight as of this encounter: 71.215 kg (157 lb). Advance diet Up with therapy Plan for discharge tomorrow Discharge home with home health  DVT Prophylaxis - Xarelto Weight Bearing As Tolerated left Leg Hemovac Pulled Begin Therapy Additional fluids this morning and recheck BP  Arlee Muslim, PA-C Orthopaedic Surgery 02/18/2015, 7:57 AM

## 2015-02-18 NOTE — Evaluation (Signed)
Physical Therapy Evaluation Patient Details Name: Andrea Ibarra MRN: [id number] DOB: Apr 01, 1941 Today's Date: 02/18/2015   History of Present Illness  s/p L DA THA  Clinical Impression  Pt s/p L THR presents with decreased L LE strength/ROM and post op pain limiting functional mobility.  Pt should progress to dc home with dtr and follow up HHPT.    Follow Up Recommendations Home health PT    Equipment Recommendations  None recommended by PT    Recommendations for Other Services OT consult     Precautions / Restrictions Precautions Precautions: Fall Restrictions Weight Bearing Restrictions: No Other Position/Activity Restrictions: WBAT      Mobility  Bed Mobility Overal bed mobility: Needs Assistance Bed Mobility: Sit to Supine       Sit to supine: Mod assist   General bed mobility comments: cues for sequence and use of R LE to self assist  Transfers Overall transfer level: Needs assistance Equipment used: Rolling walker (2 wheeled) Transfers: Sit to/from Stand Sit to Stand: Min assist;Mod assist         General transfer comment: cues for UE/LE placement  Ambulation/Gait Ambulation/Gait assistance: Min assist;Mod assist Ambulation Distance (Feet): 27 Feet Assistive device: Rolling walker (2 wheeled) Gait Pattern/deviations: Step-to pattern;Decreased step length - right;Decreased step length - left;Shuffle;Trunk flexed     General Gait Details: cues for posture, sequence, position from RW, stride length and increased UE WB  Stairs            Wheelchair Mobility    Modified Rankin (Stroke Patients Only)       Balance                                             Pertinent Vitals/Pain Pain Assessment: 0-10 Pain Score: 6  Pain Location: L hip and groin Pain Descriptors / Indicators: Aching;Sore Pain Intervention(s): Limited activity within patient's tolerance;Monitored during session;Premedicated before session;Ice applied     Home Living Family/patient expects to be discharged to:: Private residence Living Arrangements: Alone Available Help at Discharge: Family Type of Home: House Home Access: Stairs to enter Entrance Stairs-Rails: None Entrance Stairs-Number of Steps: 2 Home Layout: One level Home Equipment: Environmental consultant - 2 wheels Additional Comments: will stay at daughters.  Has 3:1 commode and shower seat for walk in shower    Prior Function Level of Independence: Independent               Hand Dominance        Extremity/Trunk Assessment   Upper Extremity Assessment: Overall WFL for tasks assessed           Lower Extremity Assessment: LLE deficits/detail   LLE Deficits / Details: L hip strength 2/5 with AAROM at hip to 85 flex and 10 abd  Cervical / Trunk Assessment: Normal  Communication   Communication: No difficulties  Cognition Arousal/Alertness: Awake/alert Behavior During Therapy: WFL for tasks assessed/performed Overall Cognitive Status: Within Functional Limits for tasks assessed                      General Comments      Exercises Total Joint Exercises Ankle Circles/Pumps: AROM;Both;15 reps;Supine Quad Sets: AROM;Both;10 reps;Supine Heel Slides: AAROM;Left;20 reps;Supine Hip ABduction/ADduction: AAROM;Left;15 reps;Supine      Assessment/Plan    PT Assessment Patient needs continued PT services  PT Diagnosis Difficulty walking   PT Problem  List Decreased strength;Decreased range of motion;Decreased activity tolerance;Decreased mobility;Decreased knowledge of use of DME;Pain  PT Treatment Interventions DME instruction;Gait training;Stair training;Functional mobility training;Therapeutic activities;Therapeutic exercise;Patient/family education   PT Goals (Current goals can be found in the Care Plan section) Acute Rehab PT Goals Patient Stated Goal: get back to being independent; decreased pain PT Goal Formulation: With patient Time For Goal Achievement:  02/24/15 Potential to Achieve Goals: Good    Frequency 7X/week   Barriers to discharge        Co-evaluation               End of Session Equipment Utilized During Treatment: Gait belt Activity Tolerance: Patient tolerated treatment well;Patient limited by fatigue Patient left: in bed;with call bell/phone within reach;with family/visitor present Nurse Communication: Mobility status         Time: 4665-9935 PT Time Calculation (min) (ACUTE ONLY): 27 min   Charges:   PT Evaluation $Initial PT Evaluation Tier I: 1 Procedure PT Treatments $Therapeutic Exercise: 8-22 mins   PT G Codes:        Andrea Ibarra 2015/02/21, 1:02 PM

## 2015-02-18 NOTE — Progress Notes (Signed)
Physical Therapy Treatment Patient Details Name: Andrea Ibarra MRN: [id number] DOB: [birth year] Today's Date: 03/15/15    History of Present Illness s/p L DA THA    PT Comments    Progressing slowly with mobility but improvement noted in activity tolerance and WB tolerance L LE.  Follow Up Recommendations  Home health PT     Equipment Recommendations  None recommended by PT    Recommendations for Other Services OT consult     Precautions / Restrictions Precautions Precautions: Fall Restrictions Weight Bearing Restrictions: No Other Position/Activity Restrictions: WBAT    Mobility  Bed Mobility Overal bed mobility: Needs Assistance Bed Mobility: Supine to Sit;Sit to Supine     Supine to sit: Min assist Sit to supine: Min assist;Mod assist   General bed mobility comments: cues for sequence and use of R LE to self assist  Transfers Overall transfer level: Needs assistance Equipment used: Rolling walker (2 wheeled) Transfers: Sit to/from Stand Sit to Stand: Min assist         General transfer comment: cues for UE/LE placement  Ambulation/Gait Ambulation/Gait assistance: Min assist Ambulation Distance (Feet): 17 Feet (twice ) Assistive device: Rolling walker (2 wheeled) Gait Pattern/deviations: Step-to pattern;Decreased step length - right;Decreased step length - left;Shuffle;Trunk flexed Gait velocity: decr   General Gait Details: cues for posture, sequence, position from RW, stride length and increased UE WB   Stairs            Wheelchair Mobility    Modified Rankin (Stroke Patients Only)       Balance                                    Cognition Arousal/Alertness: Awake/alert Behavior During Therapy: WFL for tasks assessed/performed Overall Cognitive Status: Within Functional Limits for tasks assessed                      Exercises      General Comments        Pertinent Vitals/Pain Pain Assessment:  0-10 Pain Score: 4  Pain Location: L hip and groin Pain Descriptors / Indicators: Aching;Sore Pain Intervention(s): Limited activity within patient's tolerance;Monitored during session;Premedicated before session;Ice applied    Home Living                      Prior Function            PT Goals (current goals can now be found in the care plan section) Acute Rehab PT Goals Patient Stated Goal: get back to being independent; decreased pain PT Goal Formulation: With patient Time For Goal Achievement: 02/24/15 Potential to Achieve Goals: Good Progress towards PT goals: Progressing toward goals    Frequency  7X/week    PT Plan Current plan remains appropriate    Co-evaluation             End of Session Equipment Utilized During Treatment: Gait belt Activity Tolerance: Patient tolerated treatment well;Patient limited by fatigue Patient left: in bed;with call bell/phone within reach     Time: 1545-1612 PT Time Calculation (min) (ACUTE ONLY): 27 min  Charges:  $Gait Training: 23-37 mins                    G Codes:      Andrea Ibarra 03-15-15, 4:46 PM

## 2015-02-18 NOTE — Evaluation (Signed)
Occupational Therapy Evaluation Patient Details Name: Ramiyah Dumont MRN: [id number] DOB: November 06, 1940 Today's Date: 02/18/2015    History of Present Illness s/p L DA THA   Clinical Impression   This 74 year old female was admitted for the above surgery.  She plans to stay at daughter's and was independent with adls prior to admission.  Pt currently needs max A for LB adls and min A for transfers.  She will benefit from skilled OT in acute. Goals are for supervision to min guard level for bathroom transfers, toileting and standing grooming tasks    Follow Up Recommendations  No OT follow up;Supervision/Assistance - 24 hour    Equipment Recommendations  None recommended by OT    Recommendations for Other Services       Precautions / Restrictions Precautions Precautions: Fall Restrictions Weight Bearing Restrictions: No      Mobility Bed Mobility               General bed mobility comments: OOB  Transfers Overall transfer level: Needs assistance Equipment used: Rolling walker (2 wheeled) Transfers: Sit to/from Stand Sit to Stand: Min assist         General transfer comment: cues for UE/LE placement    Balance                                            ADL Overall ADL's : Needs assistance/impaired     Grooming: Wash/dry hands;Wash/dry face;Set up;Sitting   Upper Body Bathing: Set up;Sitting   Lower Body Bathing: Moderate assistance;Sit to/from stand   Upper Body Dressing : Set up;Sitting   Lower Body Dressing: Maximal assistance;Sit to/from stand   Toilet Transfer: Minimal assistance;BSC (ambulated 3 feet)   Toileting- Clothing Manipulation and Hygiene: Moderate assistance;Sit to/from stand         General ADL Comments: Pt was sitting on commode.  Completed ADL from there and ambulated a few feet to the recliner. Daughter present.  She will stay with her.  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     Vision     Perception     Praxis       Pertinent Vitals/Pain Pain Assessment: 0-10 Pain Score: 6  Pain Location: L hip and groin Pain Descriptors / Indicators: Aching Pain Intervention(s): Limited activity within patient's tolerance;Monitored during session;Premedicated before session;Repositioned     Hand Dominance     Extremity/Trunk Assessment Upper Extremity Assessment Upper Extremity Assessment: Overall WFL for tasks assessed           Communication Communication Communication: No difficulties   Cognition Arousal/Alertness: Awake/alert Behavior During Therapy: WFL for tasks assessed/performed Overall Cognitive Status: Within Functional Limits for tasks assessed                     General Comments       Exercises       Shoulder Instructions      Home Living Family/patient expects to be discharged to:: Private residence Living Arrangements: Alone                               Additional Comments: will stay at daughters.  Has 3:1 commode and shower seat for walk in shower      Prior Functioning/Environment Level of Independence: Independent  OT Diagnosis: Generalized weakness;Acute pain   OT Problem List: Decreased strength;Decreased activity tolerance;Decreased knowledge of use of DME or AE;Pain   OT Treatment/Interventions: Self-care/ADL training;Manual therapy;Patient/family education    OT Goals(Current goals can be found in the care plan section) Acute Rehab OT Goals Patient Stated Goal: get back to being independent; decreased pain OT Goal Formulation: With patient Time For Goal Achievement: 02/25/15 Potential to Achieve Goals: Good ADL Goals Pt Will Perform Grooming: with supervision;standing Pt Will Transfer to Toilet: with min guard assist;ambulating;bedside commode Pt Will Perform Toileting - Clothing Manipulation and hygiene: with min guard assist;sit to/from stand Pt Will Perform Tub/Shower Transfer: Shower transfer;with min guard  assist;3 in 1;ambulating;shower seat  OT Frequency: Min 2X/week   Barriers to D/C:            Co-evaluation              End of Session    Activity Tolerance: Patient tolerated treatment well Patient left: in chair;with call bell/phone within reach;with family/visitor present   Time: 0900-0918 OT Time Calculation (min): 18 min Charges:  OT General Charges $OT Visit: 1 Procedure OT Evaluation $Initial OT Evaluation Tier I: 1 Procedure G-Codes:    Kenly Xiao 2015-03-10, 9:37 AM  Lesle Chris, OTR/L [phone] 03/10/15

## 2015-02-18 NOTE — Discharge Instructions (Addendum)
Dr. Gaynelle Arabian Total Joint Specialist [facility] [address] [place], [place] [address] [phone]  ANTERIOR APPROACH TOTAL HIP REPLACEMENT POSTOPERATIVE DIRECTIONS   Hip Rehabilitation, Guidelines Following Surgery  The results of a hip operation are greatly improved after range of motion and muscle strengthening exercises. Follow all safety measures which are given to protect your hip. If any of these exercises cause increased pain or swelling in your joint, decrease the amount until you are comfortable again. Then slowly increase the exercises. Call your caregiver if you have problems or questions.   HOME CARE INSTRUCTIONS  Remove items at home which could result in a fall. This includes throw rugs or furniture in walking pathways.   ICE to the affected hip every three hours for 30 minutes at a time and then as needed for pain and swelling.  Continue to use ice on the hip for pain and swelling from surgery. You may notice swelling that will progress down to the foot and ankle.  This is normal after surgery.  Elevate the leg when you are not up walking on it.    Continue to use the breathing machine which will help keep your temperature down.  It is common for your temperature to cycle up and down following surgery, especially at night when you are not up moving around and exerting yourself.  The breathing machine keeps your lungs expanded and your temperature down.  Do not place pillow under knee, focus on keeping the knee straight while resting  DIET You may resume your previous home diet once your are discharged from the hospital.  DRESSING / WOUND CARE / SHOWERING You may shower 3 days after surgery, but keep the wounds dry during showering.  You may use an occlusive plastic wrap (Press'n Seal for example), NO SOAKING/SUBMERGING IN THE BATHTUB.  If the bandage gets wet, change with a clean dry gauze.  If the incision gets wet, pat the wound dry with  a clean towel. You may start showering once you are discharged home but do not submerge the incision under water. Just pat the incision dry and apply a dry gauze dressing on daily. Change the surgical dressing daily and reapply a dry dressing each time.  ACTIVITY Walk with your walker as instructed. Use walker as long as suggested by your caregivers. Avoid periods of inactivity such as sitting longer than an hour when not asleep. This helps prevent blood clots.  You may resume a sexual relationship in one month or when given the OK by your doctor.  You may return to work once you are cleared by your doctor.  Do not drive a car for 6 weeks or until released by you surgeon.  Do not drive while taking narcotics.  WEIGHT BEARING Weight bearing as tolerated with assist device (walker, cane, etc) as directed, use it as long as suggested by your surgeon or therapist, typically at least 4-6 weeks.  POSTOPERATIVE CONSTIPATION PROTOCOL Constipation - defined medically as fewer than three stools per week and severe constipation as less than one stool per week.  One of the most common issues patients have following surgery is constipation.  Even if you have a regular bowel pattern at home, your normal regimen is likely to be disrupted due to multiple reasons following surgery.  Combination of anesthesia, postoperative narcotics, change in appetite and fluid intake all can affect your bowels.  In order to avoid complications following surgery, here are some recommendations in order to  help you during your recovery period.  Colace (docusate) - Pick up an over-the-counter form of Colace or another stool softener and take twice a day as long as you are requiring postoperative pain medications.  Take with a full glass of water daily.  If you experience loose stools or diarrhea, hold the colace until you stool forms back up.  If your symptoms do not get better within 1 week or if they get worse, check with your  doctor.  Dulcolax (bisacodyl) - Pick up over-the-counter and take as directed by the product packaging as needed to assist with the movement of your bowels.  Take with a full glass of water.  Use this product as needed if not relieved by Colace only.   MiraLax (polyethylene glycol) - Pick up over-the-counter to have on hand.  MiraLax is a solution that will increase the amount of water in your bowels to assist with bowel movements.  Take as directed and can mix with a glass of water, juice, soda, coffee, or tea.  Take if you go more than two days without a movement. Do not use MiraLax more than once per day. Call your doctor if you are still constipated or irregular after using this medication for 7 days in a row.  If you continue to have problems with postoperative constipation, please contact the office for further assistance and recommendations.  If you experience "the worst abdominal pain ever" or develop nausea or vomiting, please contact the office immediatly for further recommendations for treatment.  ITCHING  If you experience itching with your medications, try taking only a single pain pill, or even half a pain pill at a time.  You can also use Benadryl over the counter for itching or also to help with sleep.   TED HOSE STOCKINGS Wear the elastic stockings on both legs for three weeks following surgery during the day but you may remove then at night for sleeping.  MEDICATIONS See your medication summary on the After Visit Summary that the nursing staff will review with you prior to discharge.  You may have some home medications which will be placed on hold until you complete the course of blood thinner medication.  It is important for you to complete the blood thinner medication as prescribed by your surgeon.  Continue your approved medications as instructed at time of discharge.  PRECAUTIONS If you experience chest pain or shortness of breath - call 911 immediately for transfer to the  hospital emergency department.  If you develop a fever greater that 101 F, purulent drainage from wound, increased redness or drainage from wound, foul odor from the wound/dressing, or calf pain - CONTACT YOUR SURGEON.                                                   FOLLOW-UP APPOINTMENTS Make sure you keep all of your appointments after your operation with your surgeon and caregivers. You should call the office at the above phone number and make an appointment for approximately two weeks after the date of your surgery or on the date instructed by your surgeon outlined in the "After Visit Summary".  RANGE OF MOTION AND STRENGTHENING EXERCISES  These exercises are designed to help you keep full movement of your hip joint. Follow your caregiver's or physical therapist's instructions. Perform all exercises about fifteen  times, three times per day or as directed. Exercise both hips, even if you have had only one joint replacement. These exercises can be done on a training (exercise) mat, on the floor, on a table or on a bed. Use whatever works the best and is most comfortable for you. Use music or television while you are exercising so that the exercises are a pleasant break in your day. This will make your life better with the exercises acting as a break in routine you can look forward to.  Lying on your back, slowly slide your foot toward your buttocks, raising your knee up off the floor. Then slowly slide your foot back down until your leg is straight again.  Lying on your back spread your legs as far apart as you can without causing discomfort.  Lying on your side, raise your upper leg and foot straight up from the floor as far as is comfortable. Slowly lower the leg and repeat.  Lying on your back, tighten up the muscle in the front of your thigh (quadriceps muscles). You can do this by keeping your leg straight and trying to raise your heel off the floor. This helps strengthen the largest muscle  supporting your knee.  Lying on your back, tighten up the muscles of your buttocks both with the legs straight and with the knee bent at a comfortable angle while keeping your heel on the floor.   IF YOU ARE TRANSFERRED TO A SKILLED REHAB FACILITY If the patient is transferred to a skilled rehab facility following release from the hospital, a list of the current medications will be sent to the facility for the patient to continue.  When discharged from the skilled rehab facility, please have the facility set up the patient's [place] prior to being released. Also, the skilled facility will be responsible for providing the patient with their medications at time of release from the facility to include their pain medication, the muscle relaxants, and their blood thinner medication. If the patient is still at the rehab facility at time of the two week follow up appointment, the skilled rehab facility will also need to assist the patient in arranging follow up appointment in our office and any transportation needs.  MAKE SURE YOU:  Understand these instructions.  Get help right away if you are not doing well or get worse.    Pick up stool softner and laxative for home use following surgery while on pain medications. Do not submerge incision under water. Please use good hand washing techniques while changing dressing each day. May shower starting three days after surgery. Please use a clean towel to pat the incision dry following showers. Continue to use ice for pain and swelling after surgery. Do not use any lotions or creams on the incision until instructed by your surgeon.  Take Xarelto for two and a half more weeks, then discontinue Xarelto. Once the patient has completed the Xarelto, they may resume the 81 mg Aspirin.   Information on my medicine - XARELTO (Rivaroxaban)  This medication education was reviewed with me or my healthcare representative as part of my discharge  preparation.  The pharmacist that spoke with me during my hospital stay was:  WOFFORD, DREW A, RPH  Why was Xarelto prescribed for you? Xarelto was prescribed for you to reduce the risk of blood clots forming after orthopedic surgery. The medical term for these abnormal blood clots is venous thromboembolism (VTE).  What do you need to  know about xarelto ? Take your Xarelto ONCE DAILY at the same time every day. You may take it either with or without food.  If you have difficulty swallowing the tablet whole, you may crush it and mix in applesauce just prior to taking your dose.  Take Xarelto exactly as prescribed by your doctor and DO NOT stop taking Xarelto without talking to the doctor who prescribed the medication.  Stopping without other VTE prevention medication to take the place of Xarelto may increase your risk of developing a clot.  After discharge, you should have regular check-up appointments with your healthcare provider that is prescribing your Xarelto.    What do you do if you miss a dose? If you miss a dose, take it as soon as you remember on the same day then continue your regularly scheduled once daily regimen the next day. Do not take two doses of Xarelto on the same day.   Important Safety Information A possible side effect of Xarelto is bleeding. You should call your healthcare provider right away if you experience any of the following: ? Bleeding from an injury or your nose that does not stop. ? Unusual colored urine (red or dark brown) or unusual colored stools (red or black). ? Unusual bruising for unknown reasons. ? A serious fall or if you hit your head (even if there is no bleeding).  Some medicines may interact with Xarelto and might increase your risk of bleeding while on Xarelto. To help avoid this, consult your healthcare provider or pharmacist prior to using any new prescription or non-prescription medications, including herbals, vitamins,  non-steroidal anti-inflammatory drugs (NSAIDs) and supplements.  This website has more information on Xarelto: [email or web address]

## 2015-02-18 NOTE — Care Management Note (Signed)
Case Management Note  Patient Details  Name: Andrea Ibarra MRN: [id number] Date of Birth: Feb 22, 1941  Subjective/Objective:                   LEFT TOTAL HIP ARTHROPLASTY ANTERIOR APPROACH (Left) Action/Plan: Discharge planning  Expected Discharge Date:  02/19/15               Expected Discharge Plan:  [place]  In-House Referral:     Discharge planning Services  CM Consult  Post Acute Care Choice:  Home Health Choice offered to:  Patient  DME Arranged:    DME Agency:     HH Arranged:  PT HH Agency:  [facility]  Status of Service:  Completed, signed off  Medicare Important Message Given:    Date Medicare IM Given:    Medicare IM give by:    Date Additional Medicare IM Given:    Additional Medicare Important Message give by:     If discussed at [place] of Stay Meetings, dates discussed:    Additional Comments: CM met with pt in room to offer choice of home health agency.  Pt chooses [facility].  Pt will be staying with friend, [name] [place] [address] [phone].  Pt has shower stool, rolling walker and 3n1 at home and does not need any additional DME.  Referral called to [facility] with request for Rena Lara and appropriate address and contact information.  No other CM needs were communicated. Dellie Catholic, RN 02/18/2015, 11:52 AM

## 2015-02-19 LAB — BASIC METABOLIC PANEL
ANION GAP: 6 (ref 5–15)
BUN: 16 mg/dL (ref 6–20)
CHLORIDE: 108 mmol/L (ref 101–111)
CO2: 29 mmol/L (ref 22–32)
Calcium: 8.9 mg/dL (ref 8.9–10.3)
Creatinine, Ser: 0.74 mg/dL (ref 0.44–1.00)
GFR calc Af Amer: >60 mL/min (ref >60)
GFR calc non Af Amer: >60 mL/min (ref >60)
Glucose, Bld: 142 mg/dL — ABNORMAL HIGH (ref 65–99)
Potassium: 4.5 mmol/L (ref 3.5–5.1)
Sodium: 143 mmol/L (ref 135–145)

## 2015-02-19 LAB — CBC
HCT: 33.5 % — ABNORMAL LOW (ref 36.0–46.0)
Hemoglobin: 11.1 g/dL — ABNORMAL LOW (ref 12.0–15.0)
MCH: 31.2 pg (ref 26.0–34.0)
MCHC: 33.1 g/dL (ref 30.0–36.0)
MCV: 94.1 fL (ref 78.0–100.0)
PLATELETS: 146 10*3/uL — AB (ref 150–400)
RBC: 3.56 MIL/uL — ABNORMAL LOW (ref 3.87–5.11)
RDW: 12.8 % (ref 11.5–15.5)
WBC: 13.9 10*3/uL — ABNORMAL HIGH (ref 4.0–10.5)

## 2015-02-19 MED ORDER — RIVAROXABAN 10 MG PO TABS: 10.0000 mg | ORAL_TABLET | Freq: Every day | ORAL | Status: DC

## 2015-02-19 MED ORDER — HYDROMORPHONE HCL 2 MG PO TABS: 2.0000 mg | ORAL_TABLET | ORAL | Status: DC | PRN

## 2015-02-19 MED ORDER — TRAMADOL HCL 50 MG PO TABS: 50.0000 mg | ORAL_TABLET | Freq: Four times a day (QID) | ORAL | Status: DC | PRN

## 2015-02-19 MED ORDER — METHOCARBAMOL 500 MG PO TABS: 500.0000 mg | ORAL_TABLET | Freq: Four times a day (QID) | ORAL | Status: DC | PRN

## 2015-02-19 NOTE — Progress Notes (Signed)
Patient did not pass PT goals this afternoon (completing stairs).   PA called, and informed of patient's situation. PA gave order to delay discharge home until tomorrow.

## 2015-02-19 NOTE — Progress Notes (Signed)
Physical Therapy Treatment Patient Details Name: Andrea Ibarra MRN: [id number] DOB: 08-11-1941 Today's Date: 02/19/2015    History of Present Illness s/p L DA THA    PT Comments    Pt progressing slowly with mobility and struggling this pm to negotiate stairs needed to access home.  Pt and dtr expressing concern with dc this date.  RN aware and contacting Dr Maureen Ralphs.  Reviewed stairs, car transfers and bed mobility with pt and dtr this session.  Follow Up Recommendations  Home health PT     Equipment Recommendations  None recommended by PT    Recommendations for Other Services OT consult     Precautions / Restrictions Precautions Precautions: Fall Restrictions Weight Bearing Restrictions: No Other Position/Activity Restrictions: WBAT    Mobility  Bed Mobility Overal bed mobility: Needs Assistance Bed Mobility: Supine to Sit     Supine to sit: Min assist Sit to supine: Min assist   General bed mobility comments: cues for technique and assist for LLE  Transfers Overall transfer level: Needs assistance Equipment used: Rolling walker (2 wheeled) Transfers: Sit to/from Stand Sit to Stand: Min assist         General transfer comment: cues for UE/LE placement and feeling surface with RLE prior to sitting  Ambulation/Gait Ambulation/Gait assistance: Min assist Ambulation Distance (Feet): 27 Feet Assistive device: Rolling walker (2 wheeled) Gait Pattern/deviations: Step-to pattern;Decreased step length - right;Decreased step length - left;Shuffle;Trunk flexed Gait velocity: decr   General Gait Details: cues for sequence, posture, position from RW and increased UE WB   Stairs Stairs: Yes Stairs assistance: Mod assist Stair Management: No rails;Step to pattern;Backwards;With walker Number of Stairs: 2 General stair comments: cues for sequence and foot/RW placement.  Pt requires mod assist to climb step 2* nonoperative LE weakness  Wheelchair Mobility     Modified Rankin (Stroke Patients Only)       Balance                                    Cognition Arousal/Alertness: Awake/alert Behavior During Therapy: WFL for tasks assessed/performed Overall Cognitive Status: Within Functional Limits for tasks assessed                      Exercises      General Comments        Pertinent Vitals/Pain Pain Assessment: 0-10 Pain Score: 2  Pain Location: L hip/groin Pain Descriptors / Indicators: Aching Pain Intervention(s): Limited activity within patient's tolerance;Monitored during session;Premedicated before session;Patient requesting pain meds-RN notified    Home Living                      Prior Function            PT Goals (current goals can now be found in the care plan section) Acute Rehab PT Goals Patient Stated Goal: get back to being independent; decreased pain PT Goal Formulation: With patient Time For Goal Achievement: 02/24/15 Potential to Achieve Goals: Good Progress towards PT goals: Progressing toward goals    Frequency  7X/week    PT Plan Current plan remains appropriate    Co-evaluation             End of Session Equipment Utilized During Treatment: Gait belt Activity Tolerance: Patient limited by fatigue Patient left: in chair;with call bell/phone within reach;with family/visitor present     Time: 2355-7322 PT  Time Calculation (min) (ACUTE ONLY): 42 min  Charges:  $Gait Training: 23-37 mins $Therapeutic Activity: 8-22 mins                    G Codes:      Desmond Tufano 2015/03/08, 5:19 PM

## 2015-02-19 NOTE — Progress Notes (Signed)
Occupational Therapy Treatment Patient Details Name: Andrea Ibarra MRN: [id number] DOB: [birth year] Today's Date: 02/19/2015    History of present illness  s/p L DA THA   OT comments  Daughter present and reviewed bathroom transfers, bed mobility and ADLs.  She verbalizes understanding of all.  Daughter had several questions which were deferred to nursing discharge instructions.  Pt continues to need cues for safety and extra time for all activities  Follow Up Recommendations  No OT follow up;Supervision/Assistance - 24 hour    Equipment Recommendations  None recommended by OT    Recommendations for Other Services      Precautions / Restrictions Precautions Precautions: Fall Restrictions Other Position/Activity Restrictions: WBAT       Mobility Bed Mobility           Sit to supine: Min assist   General bed mobility comments: cues for technique and assist for LLE  Transfers   Equipment used: Rolling walker (2 wheeled) Transfers: Sit to/from Stand Sit to Stand: Min assist         General transfer comment: cues for UE/LE placement and feeling surface with RLE prior to sitting    Balance                                   ADL       Grooming: Wash/dry hands;Supervision/safety;Standing                   Toilet Transfer: Minimal assistance;Ambulation;BSC   Toileting- Clothing Manipulation and Hygiene: Minimal assistance;Bed level         General ADL Comments: daughter present and had many questions:  several deferred to nursing instructions.  Demonstrated and reviewed shower sequence: pt practiced this am and did not want to practice again.  Daughter watched bed mobility for back to bed.  Pt's bed will be 26":  may need sturdy stool (bed height was approximately 24").  daughter had questions about donning ted hose:  showed folding back to heel and donning and explained plastic bag.   Pt continues to need extra time for activities and cues for  safety/sequence. Did better following cues this pm.  Daughter will assist with ADLs       Vision                     Perception     Praxis      Cognition   Behavior During Therapy: WFL for tasks assessed/performed Overall Cognitive Status: Within Functional Limits for tasks assessed                       Extremity/Trunk Assessment               Exercises     Shoulder Instructions       General Comments      Pertinent Vitals/ Pain       Pain Score: 2  Pain Location: L hip/groin Pain Descriptors / Indicators: Aching Pain Intervention(s): Limited activity within patient's tolerance;Monitored during session;Premedicated before session;Patient requesting pain meds-RN notified  Home Living                                          Prior Functioning/Environment  Frequency       Progress Toward Goals  OT Goals(current goals can now be found in the care plan section)  Progress towards OT goals: Progressing toward goals     Plan      Co-evaluation                 End of Session  Pt left in bed with call bell; family present   Activity Tolerance Patient tolerated treatment well   Patient Left in bed;with call bell/phone within reach;with family/visitor present   Nurse Communication          Time: 6808-8110 OT Time Calculation (min): 44 min  Charges: OT General Charges $OT Visit: 1 Procedure OT Treatments $Self Care/Home Management : 23-37 mins $Therapeutic Activity: 8-22 mins  Kirsty Monjaraz 02/19/2015, 4:08 PM  Lesle Chris, OTR/L [phone] 02/19/2015

## 2015-02-19 NOTE — Progress Notes (Signed)
Occupational Therapy Treatment Patient Details Name: Tyresha Rezabek MRN: [id number] DOB: Oct 22, 1940 Today's Date: 02/19/2015    History of present illness s/p L DA THA   OT comments  Pt needed multimodal cues for bed mobility and she was able to perform bathroom transfers with extra time.  Plan to see pt again when daughter is here to review safety during transfers with them  Follow Up Recommendations  No OT follow up;Supervision/Assistance - 24 hour    Equipment Recommendations  None recommended by OT    Recommendations for Other Services      Precautions / Restrictions Precautions Precautions: Fall Restrictions Weight Bearing Restrictions: No Other Position/Activity Restrictions: WBAT       Mobility Bed Mobility         Supine to sit: Min assist     General bed mobility comments: cues for sequence---multimodal as pt had difficulty processing directional cues at times  Transfers   Equipment used: Rolling walker (2 wheeled) Transfers: Sit to/from Stand Sit to Stand: Min assist         General transfer comment: cues for UE/LE placement    Balance                                   ADL       Grooming: Oral care;Supervision/safety;Standing                   Toilet Transfer: Minimal assistance;Ambulation;BSC   Toileting- Clothing Manipulation and Hygiene: Minimal assistance;Bed level   Tub/ Shower Transfer: Walk-in shower;Ambulation;Minimal assistance     General ADL Comments: pt needed extra time for all activities:  ambulated to bathroom slowly:  had groin pain when up moving.  Pt was unable to use leg lifter to get OOB (although she has one) because this made groin more painful.  Extra time and cues for shower transfer      Vision                     Perception     Praxis      Cognition   Behavior During Therapy: [facility] for tasks assessed/performed Overall Cognitive Status: Within Functional Limits for tasks  assessed                       Extremity/Trunk Assessment               Exercises     Shoulder Instructions       General Comments      Pertinent Vitals/ Pain       Pain Score: 5  (with movement) Pain Location: Lhip/groin Pain Descriptors / Indicators: Aching Pain Intervention(s): Limited activity within patient's tolerance;Monitored during session;Premedicated before session;Repositioned  Home Living                                          Prior Functioning/Environment              Frequency Min 2X/week     Progress Toward Goals  OT Goals(current goals can now be found in the care plan section)  Progress towards OT goals: Progressing toward goals     Plan Frequency remains appropriate    Co-evaluation  End of Session     Activity Tolerance Patient tolerated treatment well   Patient Left in chair;with call bell/phone within reach   Nurse Communication          Time: 7673-4193 OT Time Calculation (min): 33 min  Charges: OT General Charges $OT Visit: 1 Procedure OT Treatments $Self Care/Home Management : 23-37 mins  Myrel Rappleye 02/19/2015, 11:35 AM Lesle Chris, OTR/L [phone] 02/19/2015

## 2015-02-19 NOTE — Progress Notes (Signed)
Physical Therapy Treatment Patient Details Name: Quaneshia Passon MRN: [id number] DOB: 11-07-1940 Today's Date: 03/16/2015    History of Present Illness s/p L DA THA    PT Comments    Pt cooperative but progressing slowly  Follow Up Recommendations  Home health PT     Equipment Recommendations  None recommended by PT    Recommendations for Other Services OT consult     Precautions / Restrictions Precautions Precautions: Fall Restrictions Weight Bearing Restrictions: No Other Position/Activity Restrictions: WBAT    Mobility  Bed Mobility         Supine to sit: Min assist     General bed mobility comments: cues for sequence---multimodal as pt had difficulty processing directional cues at times  Transfers Overall transfer level: Needs assistance Equipment used: Rolling walker (2 wheeled) Transfers: Sit to/from Stand Sit to Stand: Min assist         General transfer comment: cues for UE/LE placement  Ambulation/Gait             General Gait Details: deferred on arrival of lunch trays   Stairs            Wheelchair Mobility    Modified Rankin (Stroke Patients Only)       Balance                                    Cognition Arousal/Alertness: Awake/alert Behavior During Therapy: WFL for tasks assessed/performed Overall Cognitive Status: Within Functional Limits for tasks assessed                      Exercises Total Joint Exercises Ankle Circles/Pumps: AROM;Both;15 reps;Supine Quad Sets: AROM;Both;10 reps;Supine Gluteal Sets: AROM;Both;10 reps;Supine Heel Slides: AAROM;Left;20 reps;Supine Hip ABduction/ADduction: AAROM;Left;Supine;20 reps    General Comments        Pertinent Vitals/Pain Pain Assessment: 0-10 Pain Score: 5  Pain Location: L hip/groin Pain Descriptors / Indicators: Aching;Sore Pain Intervention(s): Limited activity within patient's tolerance;Monitored during session;Premedicated before  session;Ice applied    Home Living                      Prior Function            PT Goals (current goals can now be found in the care plan section) Acute Rehab PT Goals Patient Stated Goal: get back to being independent; decreased pain PT Goal Formulation: With patient Time For Goal Achievement: 02/24/15 Potential to Achieve Goals: Good Progress towards PT goals: Progressing toward goals    Frequency  7X/week    PT Plan Current plan remains appropriate    Co-evaluation             End of Session   Activity Tolerance: Patient tolerated treatment well Patient left: in chair;with call bell/phone within reach     Time: 1143-1206 PT Time Calculation (min) (ACUTE ONLY): 23 min  Charges:  $Therapeutic Exercise: 23-37 mins                    G Codes:      Tracyann Duffell 03/16/2015, 12:29 PM

## 2015-02-19 NOTE — Discharge Summary (Signed)
Physician Discharge Summary   Patient ID: Andrea Ibarra MRN: [id number] DOB/AGE: Apr 06, 1941 74 y.o.  Admit date: 02/17/2015 Discharge date: 02-19-2015  Primary Diagnosis: Osteoarthritis of the Left hip.    Admission Diagnoses:  Past Medical History  Diagnosis Date  . CAD (coronary artery disease)     significant with PTCA, MI in 1996  . Hypertension   . Dyslipidemia   . H/O: hysterectomy   . Vertigo   . PONV (postoperative nausea and vomiting)   . Myocardial infarction     remotely in 1996  . Asthma     as child  . Pneumonia     as teenager  . Headache     migraines years ago  . Fibromyalgia   . Rheumatoid arthritis(714.0)     rheumatoid and osteoarthritis   Discharge Diagnoses:   Active Problems:   OA (osteoarthritis) of hip  Estimated body mass index is 31.19 kg/(m^2) as calculated from the following:   Height as of this encounter: 4' 11.5" (1.511 m).   Weight as of this encounter: 71.215 kg (157 lb).  Procedure(s) (LRB): LEFT TOTAL HIP ARTHROPLASTY ANTERIOR APPROACH (Left)   Consults: None  HPI: Andrea Ibarra is a 74 y.o. female who has advanced end-  stage arthritis of her Left hip with progressively worsening pain and  dysfunction.The patient has failed nonoperative management and presents for  total hip arthroplasty.   Laboratory Data: Admission on 02/17/2015  Component Date Value Ref Range Status  . ABO/RH(D) 02/17/2015 AB POS   Final  . Antibody Screen 02/17/2015 NEG   Final  . Sample Expiration 02/17/2015 02/20/2015   Final  . WBC 02/18/2015 9.6  4.0 - 10.5 K/uL Final  . RBC 02/18/2015 3.86* 3.87 - 5.11 MIL/uL Final  . Hemoglobin 02/18/2015 11.9* 12.0 - 15.0 g/dL Final  . HCT 02/18/2015 36.3  36.0 - 46.0 % Final  . MCV 02/18/2015 94.0  78.0 - 100.0 fL Final  . MCH 02/18/2015 30.8  26.0 - 34.0 pg Final  . MCHC 02/18/2015 32.8  30.0 - 36.0 g/dL Final  . RDW 02/18/2015 12.9  11.5 - 15.5 % Final  . Platelets 02/18/2015 151  150 - 400 K/uL Final  .  Sodium 02/18/2015 137  135 - 145 mmol/L Final  . Potassium 02/18/2015 4.9  3.5 - 5.1 mmol/L Final  . Chloride 02/18/2015 109  101 - 111 mmol/L Final  . CO2 02/18/2015 23  22 - 32 mmol/L Final  . Glucose, Bld 02/18/2015 202* 65 - 99 mg/dL Final  . BUN 02/18/2015 12  6 - 20 mg/dL Final  . Creatinine, Ser 02/18/2015 0.71  0.44 - 1.00 mg/dL Final  . Calcium 02/18/2015 8.4* 8.9 - 10.3 mg/dL Final  . GFR calc non Af Amer 02/18/2015 >60  >60 mL/min Final  . GFR calc Af Amer 02/18/2015 >60  >60 mL/min Final   Comment: (NOTE) The eGFR has been calculated using the CKD EPI equation. This calculation has not been validated in all clinical situations. eGFR's persistently <60 mL/min signify possible Chronic Kidney Disease.   . Anion gap 02/18/2015 5  5 - 15 Final  . WBC 02/19/2015 13.9* 4.0 - 10.5 K/uL Final  . RBC 02/19/2015 3.56* 3.87 - 5.11 MIL/uL Final  . Hemoglobin 02/19/2015 11.1* 12.0 - 15.0 g/dL Final  . HCT 02/19/2015 33.5* 36.0 - 46.0 % Final  . MCV 02/19/2015 94.1  78.0 - 100.0 fL Final  . MCH 02/19/2015 31.2  26.0 - 34.0 pg Final  .  MCHC 02/19/2015 33.1  30.0 - 36.0 g/dL Final  . RDW 02/19/2015 12.8  11.5 - 15.5 % Final  . Platelets 02/19/2015 146* 150 - 400 K/uL Final  . Sodium 02/19/2015 143  135 - 145 mmol/L Final  . Potassium 02/19/2015 4.5  3.5 - 5.1 mmol/L Final  . Chloride 02/19/2015 108  101 - 111 mmol/L Final  . CO2 02/19/2015 29  22 - 32 mmol/L Final  . Glucose, Bld 02/19/2015 142* 65 - 99 mg/dL Final  . BUN 02/19/2015 16  6 - 20 mg/dL Final  . Creatinine, Ser 02/19/2015 0.74  0.44 - 1.00 mg/dL Final  . Calcium 02/19/2015 8.9  8.9 - 10.3 mg/dL Final  . GFR calc non Af Amer 02/19/2015 >60  >60 mL/min Final  . GFR calc Af Amer 02/19/2015 >60  >60 mL/min Final   Comment: (NOTE) The eGFR has been calculated using the CKD EPI equation. This calculation has not been validated in all clinical situations. eGFR's persistently <60 mL/min signify possible Chronic  Kidney Disease.   Georgiann Hahn gap 02/19/2015 6  5 - 15 Final  Hospital Outpatient Visit on 02/12/2015  Component Date Value Ref Range Status  . MRSA, PCR 02/12/2015 NEGATIVE  NEGATIVE Final  . Staphylococcus aureus 02/12/2015 NEGATIVE  NEGATIVE Final   Comment:        The Xpert SA Assay (FDA approved for NASAL specimens in patients over 58 years of age), is one component of a comprehensive surveillance program.  Test performance has been validated by [facility] for patients greater than or equal to 93 year old. It is not intended to diagnose infection nor to guide or monitor treatment.   Marland Kitchen aPTT 02/12/2015 27  24 - 37 seconds Final  . WBC 02/12/2015 7.0  4.0 - 10.5 K/uL Final  . RBC 02/12/2015 4.50  3.87 - 5.11 MIL/uL Final  . Hemoglobin 02/12/2015 13.9  12.0 - 15.0 g/dL Final  . HCT 02/12/2015 42.6  36.0 - 46.0 % Final  . MCV 02/12/2015 94.7  78.0 - 100.0 fL Final  . MCH 02/12/2015 30.9  26.0 - 34.0 pg Final  . MCHC 02/12/2015 32.6  30.0 - 36.0 g/dL Final  . RDW 02/12/2015 12.9  11.5 - 15.5 % Final  . Platelets 02/12/2015 182  150 - 400 K/uL Final  . Sodium 02/12/2015 143  135 - 145 mmol/L Final  . Potassium 02/12/2015 5.1  3.5 - 5.1 mmol/L Final  . Chloride 02/12/2015 107  101 - 111 mmol/L Final  . CO2 02/12/2015 29  22 - 32 mmol/L Final  . Glucose, Bld 02/12/2015 93  65 - 99 mg/dL Final  . BUN 02/12/2015 16  6 - 20 mg/dL Final  . Creatinine, Ser 02/12/2015 0.68  0.44 - 1.00 mg/dL Final  . Calcium 02/12/2015 9.9  8.9 - 10.3 mg/dL Final  . Total Protein 02/12/2015 7.0  6.5 - 8.1 g/dL Final  . Albumin 02/12/2015 4.3  3.5 - 5.0 g/dL Final  . AST 02/12/2015 32  15 - 41 U/L Final  . ALT 02/12/2015 20  14 - 54 U/L Final  . Alkaline Phosphatase 02/12/2015 70  38 - 126 U/L Final  . Total Bilirubin 02/12/2015 0.8  0.3 - 1.2 mg/dL Final  . GFR calc non Af Amer 02/12/2015 >60  >60 mL/min Final  . GFR calc Af Amer 02/12/2015 >60  >60 mL/min Final   Comment: (NOTE) The eGFR has been  calculated using the CKD EPI equation. This calculation has  not been validated in all clinical situations. eGFR's persistently <60 mL/min signify possible Chronic Kidney Disease.   . Anion gap 02/12/2015 7  5 - 15 Final  . Prothrombin Time 02/12/2015 13.1  11.6 - 15.2 seconds Final  . INR 02/12/2015 0.97  0.00 - 1.49 Final  . Color, Urine 02/12/2015 YELLOW  YELLOW Final  . APPearance 02/12/2015 CLEAR  CLEAR Final  . Specific Gravity, Urine 02/12/2015 1.007  1.005 - 1.030 Final  . pH 02/12/2015 6.5  5.0 - 8.0 Final  . Glucose, UA 02/12/2015 NEGATIVE  NEGATIVE mg/dL Final  . Hgb urine dipstick 02/12/2015 NEGATIVE  NEGATIVE Final  . Bilirubin Urine 02/12/2015 NEGATIVE  NEGATIVE Final  . Ketones, ur 02/12/2015 NEGATIVE  NEGATIVE mg/dL Final  . Protein, ur 02/12/2015 NEGATIVE  NEGATIVE mg/dL Final  . Urobilinogen, UA 02/12/2015 0.2  0.0 - 1.0 mg/dL Final  . Nitrite 02/12/2015 NEGATIVE  NEGATIVE Final  . Leukocytes, UA 02/12/2015 NEGATIVE  NEGATIVE Final   MICROSCOPIC NOT DONE ON URINES WITH NEGATIVE PROTEIN, BLOOD, LEUKOCYTES, NITRITE, OR GLUCOSE <1000 mg/dL.     X-Rays:Dg Pelvis Portable  02/17/2015   CLINICAL DATA:  LEFT hip replacement.  EXAM: DG C-ARM 1-60 MIN - NRPT [facility]; PORTABLE PELVIS 1-2 VIEWS  COMPARISON:  None.  FINDINGS: New LEFT total hip arthroplasty is present with expected postsurgical changes in the soft tissues. RIGHT hip demonstrates N os acetabulum.  IMPRESSION: Uncomplicated new LEFT total hip arthroplasty.   Electronically Signed   By: Dereck Ligas M.D.   On: 02/17/2015 20:22   Dg C-arm 1-60 Min-no Report  02/17/2015   CLINICAL DATA: hip   C-ARM 1-60 MINUTES  Fluoroscopy was utilized by the requesting physician.  No radiographic  interpretation.     EKG: Orders placed or performed in visit on 02/09/15  . EKG 12-Lead     Hospital Course: Patient was admitted to [facility] and taken to the OR and underwent the above state procedure without complications.   Patient tolerated the procedure well and was later transferred to the recovery room and then to the orthopaedic floor for postoperative care.  They were given PO and IV analgesics for pain control following their surgery.  They were given 24 hours of postoperative antibiotics of  Anti-infectives    Start     Dose/Rate Route Frequency Ordered Stop   02/18/15 0600  vancomycin (VANCOCIN) IVPB 1000 mg/200 mL premix     1,000 mg 200 mL/hr over 60 Minutes Intravenous On call to O.R. 02/17/15 1408 02/17/15 1629   02/18/15 0500  vancomycin (VANCOCIN) IVPB 1000 mg/200 mL premix     1,000 mg 200 mL/hr over 60 Minutes Intravenous Every 12 hours 02/17/15 2036 02/18/15 0519     and started on DVT prophylaxis in the form of Xarelto.   PT and OT were ordered for total hip protocol.  The patient was allowed to be WBAT with therapy. Discharge planning was consulted to help with postop disposition and equipment needs.  Patient had a decent night on the evening of surgery.  They started to get up OOB with therapy on day one following some additional fluids that morning..  Hemovac drain was pulled without difficulty.  Continued to work with therapy into day two.  Dressing was changed on day two and the incision was healing well.  Patient was seen in rounds by Dr. Wynelle Link and was ready to go home following therapy.  Discharge home with home health Diet - Cardiac diet Follow up -  in 2 weeks Activity - WBAT Disposition - Home Condition Upon Discharge - Good D/C Meds - See DC Summary DVT Prophylaxis - Xarelto  Discharge Instructions    Call MD / Call 911    Complete by:  As directed   If you experience chest pain or shortness of breath, CALL 911 and be transported to the hospital emergency room.  If you develope a fever above 101 F, pus (white drainage) or increased drainage or redness at the wound, or calf pain, call your surgeon's office.     Change dressing    Complete by:  As directed   You may change your  dressing dressing daily with sterile 4 x 4 inch gauze dressing and paper tape.  Do not submerge the incision under water.     Constipation Prevention    Complete by:  As directed   Drink plenty of fluids.  Prune juice may be helpful.  You may use a stool softener, such as Colace (over the counter) 100 mg twice a day.  Use MiraLax (over the counter) for constipation as needed.     Diet - low sodium heart healthy    Complete by:  As directed      Discharge instructions    Complete by:  As directed   Pick up stool softner and laxative for home use following surgery while on pain medications. Do not submerge incision under water. Please use good hand washing techniques while changing dressing each day. May shower starting three days after surgery. Please use a clean towel to pat the incision dry following showers. Continue to use ice for pain and swelling after surgery. Do not use any lotions or creams on the incision until instructed by your surgeon.  Total Hip Protocol.  Take Xarelto for two and a half more weeks, then discontinue Xarelto. Once the patient has completed the Xarelto, they may resume the 81 mg Aspirin.  Postoperative Constipation Protocol  Constipation - defined medically as fewer than three stools per week and severe constipation as less than one stool per week.  One of the most common issues patients have following surgery is constipation.  Even if you have a regular bowel pattern at home, your normal regimen is likely to be disrupted due to multiple reasons following surgery.  Combination of anesthesia, postoperative narcotics, change in appetite and fluid intake all can affect your bowels.  In order to avoid complications following surgery, here are some recommendations in order to help you during your recovery period.  Colace (docusate) - Pick up an over-the-counter form of Colace or another stool softener and take twice a day as long as you are requiring postoperative  pain medications.  Take with a full glass of water daily.  If you experience loose stools or diarrhea, hold the colace until you stool forms back up.  If your symptoms do not get better within 1 week or if they get worse, check with your doctor.  Dulcolax (bisacodyl) - Pick up over-the-counter and take as directed by the product packaging as needed to assist with the movement of your bowels.  Take with a full glass of water.  Use this product as needed if not relieved by Colace only.   MiraLax (polyethylene glycol) - Pick up over-the-counter to have on hand.  MiraLax is a solution that will increase the amount of water in your bowels to assist with bowel movements.  Take as directed and can mix with a glass of water, juice, soda,  coffee, or tea.  Take if you go more than two days without a movement. Do not use MiraLax more than once per day. Call your doctor if you are still constipated or irregular after using this medication for 7 days in a row.  If you continue to have problems with postoperative constipation, please contact the office for further assistance and recommendations.  If you experience "the worst abdominal pain ever" or develop nausea or vomiting, please contact the office immediatly for further recommendations for treatment.     Do not sit on low chairs, stoools or toilet seats, as it may be difficult to get up from low surfaces    Complete by:  As directed      Driving restrictions    Complete by:  As directed   No driving until released by the physician.     Increase activity slowly as tolerated    Complete by:  As directed      Lifting restrictions    Complete by:  As directed   No lifting until released by the physician.     Patient may shower    Complete by:  As directed   You may shower without a dressing once there is no drainage.  Do not wash over the wound.  If drainage remains, do not shower until drainage stops.     TED hose    Complete by:  As directed   Use  stockings (TED hose) for 3 weeks on both leg(s).  You may remove them at night for sleeping.     Weight bearing as tolerated    Complete by:  As directed   Laterality:  left  Extremity:  Lower            Medication List    STOP taking these medications        aspirin 81 MG tablet     calcium citrate-vitamin D 200-200 MG-UNIT Tabs     cholecalciferol 1000 UNITS tablet  Commonly known as:  VITAMIN D     folic acid 1 MG tablet  Commonly known as:  FOLVITE     ibuprofen 200 MG tablet  Commonly known as:  ADVIL,MOTRIN      TAKE these medications        acetaminophen 325 MG tablet  Commonly known as:  TYLENOL  Take 650 mg by mouth every 6 (six) hours as needed for moderate pain or headache.     cetirizine 10 MG tablet  Commonly known as:  ZYRTEC  Take 5 mg by mouth every evening.     fluticasone 50 MCG/ACT nasal spray  Commonly known as:  FLONASE  Place 2 sprays into both nostrils every morning.     HYDROmorphone 2 MG tablet  Commonly known as:  DILAUDID  Take 1-2 tablets (2-4 mg total) by mouth every 4 (four) hours as needed for moderate pain or severe pain.     losartan 25 MG tablet  Commonly known as:  COZAAR  TAKE 1 TABLET (25 MG TOTAL) BY MOUTH DAILY.     methocarbamol 500 MG tablet  Commonly known as:  ROBAXIN  Take 1 tablet (500 mg total) by mouth every 6 (six) hours as needed for muscle spasms.     rivaroxaban 10 MG Tabs tablet  Commonly known as:  XARELTO  - Take 1 tablet (10 mg total) by mouth daily with breakfast. Take Xarelto for two and a half more weeks, then discontinue Xarelto.  - Once the patient  has completed the Xarelto, they may resume the 81 mg Aspirin.     rosuvastatin 10 MG tablet  Commonly known as:  CRESTOR  Take 10 mg by mouth at bedtime.     traMADol 50 MG tablet  Commonly known as:  ULTRAM  Take 1-2 tablets (50-100 mg total) by mouth every 6 (six) hours as needed (mild to moderate pain).     traZODone 50 MG tablet  Commonly  known as:  DESYREL  Take 25 mg by mouth at bedtime.           Follow-up Information    Follow up with [facility].   Why:  home health physical therapist Vincente Liberty has been requested for you   Contact information:   [address] [place] [address] [phone]       Follow up with [name], MD. Schedule an appointment as soon as possible for a visit on 03/02/2015.   Specialty:  Orthopedic Surgery   Why:  Call office at [phone] to setup appointment on Tuesday 03/02/2015 with Dr. Wynelle Link.   Contact information:   [address] [place] [address] [phone]       Signed: Arlee Muslim, PA-C Orthopaedic Surgery 02/19/2015, 8:45 AM

## 2015-02-19 NOTE — Progress Notes (Signed)
   Subjective: 2 Days Post-Op Procedure(s) (LRB): LEFT TOTAL HIP ARTHROPLASTY ANTERIOR APPROACH (Left) Patient reports pain as mild.   Patient seen in rounds by Dr. Wynelle Link. Patient is well, and has had no acute complaints or problems Patient is ready to go home today following therapy  Objective: Vital signs in last 24 hours: Temp:  [98 F (36.7 C)-98.4 F (36.9 C)] 98 F (36.7 C) (06/03 0513) Pulse Rate:  [66-79] 68 (06/03 0513) Resp:  [16-18] 18 (06/03 0513) BP: (100-123)/(49-79) 115/55 mmHg (06/03 0513) SpO2:  [97 %-100 %] 100 % (06/03 0513)  Intake/Output from previous day:  Intake/Output Summary (Last 24 hours) at 02/19/15 0837 Last data filed at 02/19/15 0515  Gross per 24 hour  Intake   2365 ml  Output   1200 ml  Net   1165 ml    Labs:  Recent Labs  02/18/15 0415 02/19/15 0420  HGB 11.9* 11.1*    Recent Labs  02/18/15 0415 02/19/15 0420  WBC 9.6 13.9*  RBC 3.86* 3.56*  HCT 36.3 33.5*  PLT 151 146*    Recent Labs  02/18/15 0415 02/19/15 0420  NA 137 143  K 4.9 4.5  CL 109 108  CO2 23 29  BUN 12 16  CREATININE 0.71 0.74  GLUCOSE 202* 142*  CALCIUM 8.4* 8.9   No results for input(s): LABPT, INR in the last 72 hours.  EXAM: General - Patient is Alert, Appropriate and Oriented Extremity - Neurovascular intact Sensation intact distally Dorsiflexion/Plantar flexion intact Incision - clean, dry, no drainage Motor Function - intact, moving foot and toes well on exam.   Assessment/Plan: 2 Days Post-Op Procedure(s) (LRB): LEFT TOTAL HIP ARTHROPLASTY ANTERIOR APPROACH (Left) Procedure(s) (LRB): LEFT TOTAL HIP ARTHROPLASTY ANTERIOR APPROACH (Left) Past Medical History  Diagnosis Date  . CAD (coronary artery disease)     significant with PTCA, MI in 1996  . Hypertension   . Dyslipidemia   . H/O: hysterectomy   . Vertigo   . PONV (postoperative nausea and vomiting)   . Myocardial infarction     remotely in 1996  . Asthma     as child    . Pneumonia     as teenager  . Headache     migraines years ago  . Fibromyalgia   . Rheumatoid arthritis(714.0)     rheumatoid and osteoarthritis   Active Problems:   OA (osteoarthritis) of hip  Estimated body mass index is 31.19 kg/(m^2) as calculated from the following:   Height as of this encounter: 4' 11.5" (1.511 m).   Weight as of this encounter: 71.215 kg (157 lb). Up with therapy Discharge home with home health Diet - Cardiac diet Follow up - in 2 weeks Activity - WBAT Disposition - Home Condition Upon Discharge - Good D/C Meds - See DC Summary DVT Prophylaxis - Xarelto  Arlee Muslim, PA-C Orthopaedic Surgery 02/19/2015, 8:37 AM

## 2015-02-20 LAB — CBC
HEMATOCRIT: 32.9 % — AB (ref 36.0–46.0)
HEMOGLOBIN: 10.7 g/dL — AB (ref 12.0–15.0)
MCH: 30.3 pg (ref 26.0–34.0)
MCHC: 32.5 g/dL (ref 30.0–36.0)
MCV: 93.2 fL (ref 78.0–100.0)
Platelets: 131 10*3/uL — ABNORMAL LOW (ref 150–400)
RBC: 3.53 MIL/uL — AB (ref 3.87–5.11)
RDW: 13.2 % (ref 11.5–15.5)
WBC: 11.1 10*3/uL — ABNORMAL HIGH (ref 4.0–10.5)

## 2015-02-20 NOTE — Progress Notes (Signed)
Physical Therapy Treatment Patient Details Name: Andrea Ibarra MRN: [id number] DOB: [birth year] Today's Date: 02/20/2015    History of Present Illness s/p L DA THA    PT Comments    Pt progressing slowly but steadily with mobility.  Noted improvement in transfers, bed mobility and stairs this am.    Follow Up Recommendations  Home health PT     Equipment Recommendations  None recommended by PT    Recommendations for Other Services OT consult     Precautions / Restrictions Precautions Precautions: Fall Restrictions Weight Bearing Restrictions: No Other Position/Activity Restrictions: WBAT    Mobility  Bed Mobility Overal bed mobility: Needs Assistance Bed Mobility: Supine to Sit     Supine to sit: Min assist     General bed mobility comments: cues for technique and assist for LLE  Transfers Overall transfer level: Needs assistance Equipment used: Rolling walker (2 wheeled) Transfers: Sit to/from Stand Sit to Stand: Min guard         General transfer comment: cues for UE/LE placement and feeling surface with RLE prior to sitting  Ambulation/Gait Ambulation/Gait assistance: Min guard Ambulation Distance (Feet): 38 Feet Assistive device: Rolling walker (2 wheeled) Gait Pattern/deviations: Step-to pattern;Decreased step length - right;Decreased step length - left;Shuffle;Trunk flexed Gait velocity: decr   General Gait Details: cues for sequence, posture, position from RW and increased UE WB   Stairs Stairs: Yes Stairs assistance: Min assist Stair Management: No rails;Step to pattern;Backwards;With walker Number of Stairs: 2 General stair comments: cues for sequence and foot/RW placement. Pt dtr present and assisting as second person for saftey  Wheelchair Mobility    Modified Rankin (Stroke Patients Only)       Balance                                    Cognition Arousal/Alertness: Awake/alert Behavior During Therapy: WFL for tasks  assessed/performed Overall Cognitive Status: Within Functional Limits for tasks assessed                      Exercises      General Comments        Pertinent Vitals/Pain Pain Assessment: 0-10 Pain Score: 5  Pain Location: L hip/groin Pain Descriptors / Indicators: Aching;Sore Pain Intervention(s): Limited activity within patient's tolerance;Monitored during session;Premedicated before session;Ice applied    Home Living                      Prior Function            PT Goals (current goals can now be found in the care plan section) Acute Rehab PT Goals Patient Stated Goal: get back to being independent; decreased pain PT Goal Formulation: With patient Time For Goal Achievement: 02/24/15 Potential to Achieve Goals: Good Progress towards PT goals: Progressing toward goals    Frequency  7X/week    PT Plan Current plan remains appropriate    Co-evaluation             End of Session Equipment Utilized During Treatment: Gait belt Activity Tolerance: Patient limited by fatigue Patient left: in chair;with call bell/phone within reach;with family/visitor present     Time: 7829-5621 PT Time Calculation (min) (ACUTE ONLY): 34 min  Charges:  $Gait Training: 8-22 mins $Therapeutic Activity: 8-22 mins  G Codes:      Rollande Thursby 2015/02/27, 1:38 PM

## 2015-02-20 NOTE — Plan of Care (Signed)
Problem: Discharge Progression Outcomes Goal: Barriers To Progression Addressed/Resolved Outcome: Progressing Adequately climbing stairs. Goal: Anticoagulant follow-up in place Outcome: Not Applicable Date Met:  77/41/28 Xarelto VTE,  No f/u needed.

## 2015-02-20 NOTE — Progress Notes (Signed)
Discharged from floor via w/c, family & belongings with pt. No changes in assessment. Andrea Ibarra  

## 2015-02-20 NOTE — Progress Notes (Signed)
   Subjective: 3 Days Post-Op Procedure(s) (LRB): LEFT TOTAL HIP ARTHROPLASTY ANTERIOR APPROACH (Left)  Pt doing better this morning Ready for d/c home Denies any new symptoms or issues Patient reports pain as moderate.  Objective:   VITALS:   Filed Vitals:   02/20/15 0529  BP: 125/78  Pulse: 79  Temp: 98.2 F (36.8 C)  Resp: 18    Left hip incision healing well nv intact distally No rashes or edema  LABS  Recent Labs  02/18/15 0415 02/19/15 0420 02/20/15 0446  HGB 11.9* 11.1* 10.7*  HCT 36.3 33.5* 32.9*  WBC 9.6 13.9* 11.1*  PLT 151 146* 131*     Recent Labs  02/18/15 0415 02/19/15 0420  NA 137 143  K 4.9 4.5  BUN 12 16  CREATININE 0.71 0.74  GLUCOSE 202* 142*     Assessment/Plan: 3 Days Post-Op Procedure(s) (LRB): LEFT TOTAL HIP ARTHROPLASTY ANTERIOR APPROACH (Left)  D/c home today after therapy F/u in 2 weeks Pain managment   Merla Riches, MPAS, PA-C  02/20/2015, 7:28 AM

## 2015-04-10 ENCOUNTER — Other Ambulatory Visit: Payer: Self-pay | Admitting: Cardiovascular Disease

## 2015-04-13 ENCOUNTER — Other Ambulatory Visit: Payer: Self-pay

## 2015-04-13 MED ORDER — LOSARTAN POTASSIUM 25 MG PO TABS: ORAL_TABLET | ORAL | Status: DC

## 2015-08-31 ENCOUNTER — Encounter: Payer: Self-pay | Admitting: Cardiovascular Disease

## 2015-10-21 DIAGNOSIS — M5442 Lumbago with sciatica, left side: Secondary | ICD-10-CM | POA: Diagnosis not present

## 2015-11-01 ENCOUNTER — Other Ambulatory Visit: Payer: Self-pay | Admitting: Cardiovascular Disease

## 2015-11-09 DIAGNOSIS — M25551 Pain in right hip: Secondary | ICD-10-CM | POA: Diagnosis not present

## 2015-11-09 DIAGNOSIS — M5136 Other intervertebral disc degeneration, lumbar region: Secondary | ICD-10-CM | POA: Diagnosis not present

## 2015-11-09 DIAGNOSIS — G4709 Other insomnia: Secondary | ICD-10-CM | POA: Diagnosis not present

## 2015-11-09 DIAGNOSIS — M150 Primary generalized (osteo)arthritis: Secondary | ICD-10-CM | POA: Diagnosis not present

## 2015-11-09 DIAGNOSIS — M0589 Other rheumatoid arthritis with rheumatoid factor of multiple sites: Secondary | ICD-10-CM | POA: Diagnosis not present

## 2015-11-10 DIAGNOSIS — M4317 Spondylolisthesis, lumbosacral region: Secondary | ICD-10-CM | POA: Diagnosis not present

## 2015-11-10 DIAGNOSIS — M5136 Other intervertebral disc degeneration, lumbar region: Secondary | ICD-10-CM | POA: Diagnosis not present

## 2015-11-10 DIAGNOSIS — M5442 Lumbago with sciatica, left side: Secondary | ICD-10-CM | POA: Diagnosis not present

## 2015-11-23 DIAGNOSIS — M5442 Lumbago with sciatica, left side: Secondary | ICD-10-CM | POA: Diagnosis not present

## 2015-11-30 DIAGNOSIS — M5442 Lumbago with sciatica, left side: Secondary | ICD-10-CM | POA: Diagnosis not present

## 2015-12-02 DIAGNOSIS — M5442 Lumbago with sciatica, left side: Secondary | ICD-10-CM | POA: Diagnosis not present

## 2015-12-09 DIAGNOSIS — R0981 Nasal congestion: Secondary | ICD-10-CM | POA: Diagnosis not present

## 2015-12-09 DIAGNOSIS — R05 Cough: Secondary | ICD-10-CM | POA: Diagnosis not present

## 2015-12-13 DIAGNOSIS — J3089 Other allergic rhinitis: Secondary | ICD-10-CM | POA: Diagnosis not present

## 2015-12-13 DIAGNOSIS — J300 Vasomotor rhinitis: Secondary | ICD-10-CM | POA: Diagnosis not present

## 2015-12-13 DIAGNOSIS — M5442 Lumbago with sciatica, left side: Secondary | ICD-10-CM | POA: Diagnosis not present

## 2015-12-14 DIAGNOSIS — M5442 Lumbago with sciatica, left side: Secondary | ICD-10-CM | POA: Diagnosis not present

## 2015-12-14 DIAGNOSIS — M5136 Other intervertebral disc degeneration, lumbar region: Secondary | ICD-10-CM | POA: Diagnosis not present

## 2015-12-14 DIAGNOSIS — M4317 Spondylolisthesis, lumbosacral region: Secondary | ICD-10-CM | POA: Diagnosis not present

## 2015-12-14 DIAGNOSIS — Z96642 Presence of left artificial hip joint: Secondary | ICD-10-CM | POA: Diagnosis not present

## 2015-12-20 DIAGNOSIS — J309 Allergic rhinitis, unspecified: Secondary | ICD-10-CM | POA: Diagnosis not present

## 2015-12-20 DIAGNOSIS — R05 Cough: Secondary | ICD-10-CM | POA: Diagnosis not present

## 2016-01-03 ENCOUNTER — Ambulatory Visit
Admission: RE | Admit: 2016-01-03 | Discharge: 2016-01-03 | Disposition: A | Discharge status: Home / Self Care | Payer: PPO | Source: Ambulatory Visit | Attending: Internal Medicine | Admitting: Internal Medicine

## 2016-01-03 ENCOUNTER — Other Ambulatory Visit: Payer: Self-pay | Admitting: Internal Medicine

## 2016-01-03 DIAGNOSIS — M25572 Pain in left ankle and joints of left foot: Secondary | ICD-10-CM | POA: Diagnosis not present

## 2016-01-03 DIAGNOSIS — M25473 Effusion, unspecified ankle: Secondary | ICD-10-CM | POA: Diagnosis not present

## 2016-01-03 DIAGNOSIS — R52 Pain, unspecified: Secondary | ICD-10-CM | POA: Diagnosis not present

## 2016-01-03 DIAGNOSIS — M7989 Other specified soft tissue disorders: Secondary | ICD-10-CM | POA: Diagnosis not present

## 2016-01-19 DIAGNOSIS — D2271 Melanocytic nevi of right lower limb, including hip: Secondary | ICD-10-CM | POA: Diagnosis not present

## 2016-01-19 DIAGNOSIS — D1801 Hemangioma of skin and subcutaneous tissue: Secondary | ICD-10-CM | POA: Diagnosis not present

## 2016-01-19 DIAGNOSIS — M25473 Effusion, unspecified ankle: Secondary | ICD-10-CM | POA: Diagnosis not present

## 2016-01-19 DIAGNOSIS — D225 Melanocytic nevi of trunk: Secondary | ICD-10-CM | POA: Diagnosis not present

## 2016-01-19 DIAGNOSIS — D220 Melanocytic nevi of lip: Secondary | ICD-10-CM | POA: Diagnosis not present

## 2016-01-19 DIAGNOSIS — L821 Other seborrheic keratosis: Secondary | ICD-10-CM | POA: Diagnosis not present

## 2016-01-20 ENCOUNTER — Telehealth: Payer: Self-pay | Admitting: Cardiovascular Disease

## 2016-01-20 NOTE — Telephone Encounter (Signed)
Pt c/o swelling: STAT is pt has developed SOB within 24 hours  1. How long have you been experiencing swelling? Couple weeks- will go away and come back  2. Where is the swelling located? Mostly in left ankle- sometimes right ankle   3.  Are you currently taking a "fluid pill"? no  4.  Are you currently SOB? no  5.  Have you traveled recently? no

## 2016-01-20 NOTE — Telephone Encounter (Signed)
Spoke with pt who is reporting having some edema esp at left foot and ankle off and on.  No other s/s.  Reviewed HX with her.  She is watching her NA intake and will continue to do so.  She will start elevating her feet during the day when she notices it and she will start weighing daily.  She will c/b with further concerns.  An appt was scheduled for her with Richardson Dopp for further evaluation.

## 2016-01-26 DIAGNOSIS — Z1231 Encounter for screening mammogram for malignant neoplasm of breast: Secondary | ICD-10-CM | POA: Diagnosis not present

## 2016-01-26 DIAGNOSIS — Z6830 Body mass index (BMI) 30.0-30.9, adult: Secondary | ICD-10-CM | POA: Diagnosis not present

## 2016-01-26 DIAGNOSIS — Z01419 Encounter for gynecological examination (general) (routine) without abnormal findings: Secondary | ICD-10-CM | POA: Diagnosis not present

## 2016-01-27 ENCOUNTER — Other Ambulatory Visit: Payer: Self-pay | Admitting: Cardiovascular Disease

## 2016-02-07 ENCOUNTER — Encounter: Payer: Self-pay | Admitting: Physician Assistant

## 2016-02-07 DIAGNOSIS — M0589 Other rheumatoid arthritis with rheumatoid factor of multiple sites: Secondary | ICD-10-CM | POA: Diagnosis not present

## 2016-02-07 DIAGNOSIS — M25551 Pain in right hip: Secondary | ICD-10-CM | POA: Diagnosis not present

## 2016-02-07 DIAGNOSIS — G4709 Other insomnia: Secondary | ICD-10-CM | POA: Diagnosis not present

## 2016-02-07 DIAGNOSIS — M5136 Other intervertebral disc degeneration, lumbar region: Secondary | ICD-10-CM | POA: Diagnosis not present

## 2016-02-07 DIAGNOSIS — M150 Primary generalized (osteo)arthritis: Secondary | ICD-10-CM | POA: Diagnosis not present

## 2016-02-07 DIAGNOSIS — R600 Localized edema: Secondary | ICD-10-CM | POA: Diagnosis not present

## 2016-02-10 ENCOUNTER — Ambulatory Visit (INDEPENDENT_AMBULATORY_CARE_PROVIDER_SITE_OTHER): Payer: PPO | Admitting: Physician Assistant

## 2016-02-10 ENCOUNTER — Encounter: Payer: Self-pay | Admitting: Physician Assistant

## 2016-02-10 VITALS — BP 140/84 | HR 70 | Ht 59.0 in | Wt 155.8 lb

## 2016-02-10 DIAGNOSIS — I255 Ischemic cardiomyopathy: Secondary | ICD-10-CM | POA: Diagnosis not present

## 2016-02-10 DIAGNOSIS — R0600 Dyspnea, unspecified: Secondary | ICD-10-CM

## 2016-02-10 DIAGNOSIS — R600 Localized edema: Secondary | ICD-10-CM

## 2016-02-10 DIAGNOSIS — E7800 Pure hypercholesterolemia, unspecified: Secondary | ICD-10-CM

## 2016-02-10 DIAGNOSIS — I2510 Atherosclerotic heart disease of native coronary artery without angina pectoris: Secondary | ICD-10-CM

## 2016-02-10 LAB — BRAIN NATRIURETIC PEPTIDE: BRAIN NATRIURETIC PEPTIDE: 90.2 pg/mL (ref <100)

## 2016-02-10 MED ORDER — FUROSEMIDE 20 MG PO TABS: 20.0000 mg | ORAL_TABLET | Freq: Every day | ORAL | Status: DC | PRN
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
## 2016-02-10 NOTE — Patient Instructions (Addendum)
Medication Instructions:  1. START LASIX 20 MG DAILY AS NEEDED FOR INCREASED SWELLING OR WEIGHT GAIN OF 3 LB'S OR MORE IN 1 DAY Labwork: 1. BNP TODAY Testing/Procedures: Your physician has requested that you have an echocardiogram. Echocardiography is a painless test that uses sound waves to create images of your heart. It provides your doctor with information about the size and shape of your heart and how well your heart's chambers and valves are working. This procedure takes approximately one hour. There are no restrictions for this procedure. Follow-Up: SCOTT WEAVER, PAC 3-4 WEEKS SAME DAY DR. Burt Knack IS IN THE OFFICE  Any Other Special Instructions Will Be Listed Below (If Applicable). MONITOR WEIGHT DAILY  If you need a refill on your cardiac medications before your next appointment, please call your pharmacy.

## 2016-02-11 ENCOUNTER — Telehealth: Payer: Self-pay | Admitting: *Deleted

## 2016-02-11 NOTE — Telephone Encounter (Signed)
DPR ok to lmom. Lab work normal. No changes to be made. If any questions cb [phone].

## 2016-02-15 ENCOUNTER — Ambulatory Visit: Payer: PPO | Admitting: Physician Assistant

## 2016-02-18 DIAGNOSIS — Z96651 Presence of right artificial knee joint: Secondary | ICD-10-CM | POA: Diagnosis not present

## 2016-02-18 DIAGNOSIS — Z96642 Presence of left artificial hip joint: Secondary | ICD-10-CM | POA: Diagnosis not present

## 2016-02-18 DIAGNOSIS — Z471 Aftercare following joint replacement surgery: Secondary | ICD-10-CM | POA: Diagnosis not present

## 2016-02-23 ENCOUNTER — Other Ambulatory Visit: Payer: Self-pay | Admitting: Cardiovascular Disease

## 2016-02-25 ENCOUNTER — Other Ambulatory Visit: Payer: Self-pay

## 2016-02-25 ENCOUNTER — Ambulatory Visit (HOSPITAL_COMMUNITY): Payer: PPO | Attending: Cardiology

## 2016-02-25 DIAGNOSIS — R600 Localized edema: Secondary | ICD-10-CM

## 2016-02-25 DIAGNOSIS — I071 Rheumatic tricuspid insufficiency: Secondary | ICD-10-CM | POA: Insufficient documentation

## 2016-02-25 DIAGNOSIS — R0600 Dyspnea, unspecified: Secondary | ICD-10-CM | POA: Diagnosis not present
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
## 2016-03-06 ENCOUNTER — Encounter: Payer: Self-pay | Admitting: Physician Assistant

## 2016-03-06 ENCOUNTER — Ambulatory Visit (INDEPENDENT_AMBULATORY_CARE_PROVIDER_SITE_OTHER): Payer: PPO | Admitting: Physician Assistant

## 2016-03-06 VITALS — BP 130/80 | HR 82 | Ht 59.0 in | Wt 155.4 lb

## 2016-03-06 DIAGNOSIS — I255 Ischemic cardiomyopathy: Secondary | ICD-10-CM | POA: Diagnosis not present

## 2016-03-06 DIAGNOSIS — I2510 Atherosclerotic heart disease of native coronary artery without angina pectoris: Secondary | ICD-10-CM | POA: Diagnosis not present

## 2016-03-06 DIAGNOSIS — R600 Localized edema: Secondary | ICD-10-CM

## 2016-03-06 DIAGNOSIS — E7800 Pure hypercholesterolemia, unspecified: Secondary | ICD-10-CM

## 2016-03-06 LAB — ECHOCARDIOGRAM COMPLETE
Ao-asc: 32 cm
CHL CUP LV S' LATERAL: 10.8 cm/s
CHL CUP MV DEC (S): 261
CHL CUP STROKE VOLUME: 31 mL
E decel time: 261 msec
EERAT: 12.62
FS: 28 % (ref 28–44)
IV/PV OW: 0.91
LA diam end sys: 30 mm
LA vol index: 24.1 mL/m2
LA vol: 40 mL
LADIAMINDEX: 1.81 cm/m2
LASIZE: 30 mm
LAVOLA4C: 36 mL
LDCA: 3.14 cm2
LV PW d: 10.4 mm — AB (ref 0.6–1.1)
LV TDI E'LATERAL: 4.58
LV dias vol: 59 mL (ref 46–106)
LV e' LATERAL: 4.58 cm/s
LV sys vol index: 17 mL/m2
LV sys vol: 28 mL (ref 14–42)
LVDIAVOLIN: 36 mL/m2
LVEEAVG: 12.62
LVEEMED: 12.62
LVOT VTI: 12.9 cm
LVOT diameter: 20 mm
LVOT peak grad rest: 2 mmHg
LVOT peak vel: 61.7 cm/s
LVOTSV: 41 mL
MVPKAVEL: 95.8 m/s
MVPKEVEL: 57.8 m/s
Reg peak vel: 227 cm/s
Simpson's disk: 53
TDI e' medial: 4.29
TRMAXVEL: 227 cm/s

## 2016-03-06 LAB — TSH: TSH: 2.11 mIU/L

## 2016-03-06 NOTE — Patient Instructions (Addendum)
Medication Instructions:  Your physician recommends that you continue on your current medications as directed. Please refer to the Current Medication list given to you today. Labwork: TODAY TSH Testing/Procedures: NONE Follow-Up: Your physician wants you to follow-up in: [place]. You will receive a reminder letter in the mail two months in advance. If you don't receive a letter, please call our office to schedule the follow-up appointment. Any Other Special Instructions Will Be Listed Below (If Applicable). TRY TO WEAR COMPRESSION STOCKINGS IF YOU CAN; BE SURE TO ELEVATE LEGS WHEN SITTING If you need a refill on your cardiac medications before your next appointment, please call your pharmacy.

## 2016-03-07 ENCOUNTER — Telehealth: Payer: Self-pay | Admitting: *Deleted

## 2016-03-07 NOTE — Telephone Encounter (Signed)
Follow-up    The pt is returning Carol's phone call to go over labs

## 2016-03-07 NOTE — Telephone Encounter (Signed)
Lmtcb to go over lab results 

## 2016-03-07 NOTE — Telephone Encounter (Signed)
Pt notified of lab results by phone with verbal understand ing. Pt was given echo results during OV yesterday with PA.

## 2016-03-20 DIAGNOSIS — R600 Localized edema: Secondary | ICD-10-CM | POA: Diagnosis not present

## 2016-03-20 DIAGNOSIS — L659 Nonscarring hair loss, unspecified: Secondary | ICD-10-CM | POA: Diagnosis not present

## 2016-03-29 DIAGNOSIS — R600 Localized edema: Secondary | ICD-10-CM | POA: Diagnosis not present

## 2016-03-29 DIAGNOSIS — L659 Nonscarring hair loss, unspecified: Secondary | ICD-10-CM | POA: Diagnosis not present

## 2016-04-03 DIAGNOSIS — R600 Localized edema: Secondary | ICD-10-CM | POA: Diagnosis not present

## 2016-05-10 DIAGNOSIS — M5136 Other intervertebral disc degeneration, lumbar region: Secondary | ICD-10-CM | POA: Diagnosis not present

## 2016-05-10 DIAGNOSIS — R600 Localized edema: Secondary | ICD-10-CM | POA: Diagnosis not present

## 2016-05-10 DIAGNOSIS — M150 Primary generalized (osteo)arthritis: Secondary | ICD-10-CM | POA: Diagnosis not present

## 2016-05-10 DIAGNOSIS — M79605 Pain in left leg: Secondary | ICD-10-CM | POA: Diagnosis not present

## 2016-05-10 DIAGNOSIS — M0589 Other rheumatoid arthritis with rheumatoid factor of multiple sites: Secondary | ICD-10-CM | POA: Diagnosis not present

## 2016-05-10 DIAGNOSIS — M25551 Pain in right hip: Secondary | ICD-10-CM | POA: Diagnosis not present

## 2016-05-10 DIAGNOSIS — G4709 Other insomnia: Secondary | ICD-10-CM | POA: Diagnosis not present

## 2016-05-23 DIAGNOSIS — E7800 Pure hypercholesterolemia, unspecified: Secondary | ICD-10-CM | POA: Diagnosis not present

## 2016-05-23 DIAGNOSIS — I2510 Atherosclerotic heart disease of native coronary artery without angina pectoris: Secondary | ICD-10-CM | POA: Diagnosis not present

## 2016-05-23 DIAGNOSIS — R600 Localized edema: Secondary | ICD-10-CM | POA: Diagnosis not present

## 2016-05-23 DIAGNOSIS — I10 Essential (primary) hypertension: Secondary | ICD-10-CM | POA: Diagnosis not present

## 2016-06-09 DIAGNOSIS — M7061 Trochanteric bursitis, right hip: Secondary | ICD-10-CM | POA: Diagnosis not present

## 2016-06-09 DIAGNOSIS — Z96651 Presence of right artificial knee joint: Secondary | ICD-10-CM | POA: Diagnosis not present

## 2016-06-09 DIAGNOSIS — Z471 Aftercare following joint replacement surgery: Secondary | ICD-10-CM | POA: Diagnosis not present

## 2016-06-15 DIAGNOSIS — Z124 Encounter for screening for malignant neoplasm of cervix: Secondary | ICD-10-CM | POA: Diagnosis not present

## 2016-06-21 DIAGNOSIS — J329 Chronic sinusitis, unspecified: Secondary | ICD-10-CM | POA: Diagnosis not present

## 2016-07-13 DIAGNOSIS — J069 Acute upper respiratory infection, unspecified: Secondary | ICD-10-CM | POA: Diagnosis not present

## 2016-07-21 DIAGNOSIS — M7061 Trochanteric bursitis, right hip: Secondary | ICD-10-CM | POA: Diagnosis not present

## 2016-07-26 DIAGNOSIS — Z961 Presence of intraocular lens: Secondary | ICD-10-CM | POA: Diagnosis not present

## 2016-07-26 DIAGNOSIS — H26493 Other secondary cataract, bilateral: Secondary | ICD-10-CM | POA: Diagnosis not present

## 2016-07-26 DIAGNOSIS — H5010 Unspecified exotropia: Secondary | ICD-10-CM | POA: Diagnosis not present

## 2016-07-26 DIAGNOSIS — H5213 Myopia, bilateral: Secondary | ICD-10-CM | POA: Diagnosis not present

## 2016-08-02 DIAGNOSIS — Z23 Encounter for immunization: Secondary | ICD-10-CM | POA: Diagnosis not present

## 2016-08-08 DIAGNOSIS — J069 Acute upper respiratory infection, unspecified: Secondary | ICD-10-CM | POA: Diagnosis not present

## 2016-08-14 DIAGNOSIS — M0589 Other rheumatoid arthritis with rheumatoid factor of multiple sites: Secondary | ICD-10-CM | POA: Diagnosis not present

## 2016-08-14 DIAGNOSIS — M5136 Other intervertebral disc degeneration, lumbar region: Secondary | ICD-10-CM | POA: Diagnosis not present

## 2016-08-14 DIAGNOSIS — R600 Localized edema: Secondary | ICD-10-CM | POA: Diagnosis not present

## 2016-08-14 DIAGNOSIS — M25551 Pain in right hip: Secondary | ICD-10-CM | POA: Diagnosis not present

## 2016-08-14 DIAGNOSIS — B9789 Other viral agents as the cause of diseases classified elsewhere: Secondary | ICD-10-CM | POA: Diagnosis not present

## 2016-08-14 DIAGNOSIS — J069 Acute upper respiratory infection, unspecified: Secondary | ICD-10-CM | POA: Diagnosis not present

## 2016-08-14 DIAGNOSIS — G4709 Other insomnia: Secondary | ICD-10-CM | POA: Diagnosis not present

## 2016-08-14 DIAGNOSIS — M79605 Pain in left leg: Secondary | ICD-10-CM | POA: Diagnosis not present

## 2016-08-14 DIAGNOSIS — M150 Primary generalized (osteo)arthritis: Secondary | ICD-10-CM | POA: Diagnosis not present

## 2016-08-15 DIAGNOSIS — R05 Cough: Secondary | ICD-10-CM | POA: Diagnosis not present

## 2016-08-24 DIAGNOSIS — H26491 Other secondary cataract, right eye: Secondary | ICD-10-CM | POA: Diagnosis not present

## 2016-08-30 DIAGNOSIS — Z0000 Encounter for general adult medical examination without abnormal findings: Secondary | ICD-10-CM | POA: Diagnosis not present

## 2016-08-30 DIAGNOSIS — E7800 Pure hypercholesterolemia, unspecified: Secondary | ICD-10-CM | POA: Diagnosis not present

## 2016-08-30 DIAGNOSIS — R52 Pain, unspecified: Secondary | ICD-10-CM | POA: Diagnosis not present

## 2016-08-30 DIAGNOSIS — E559 Vitamin D deficiency, unspecified: Secondary | ICD-10-CM | POA: Diagnosis not present

## 2016-08-31 DIAGNOSIS — M7061 Trochanteric bursitis, right hip: Secondary | ICD-10-CM | POA: Diagnosis not present

## 2016-09-04 DIAGNOSIS — I2510 Atherosclerotic heart disease of native coronary artery without angina pectoris: Secondary | ICD-10-CM | POA: Diagnosis not present

## 2016-09-04 DIAGNOSIS — G43909 Migraine, unspecified, not intractable, without status migrainosus: Secondary | ICD-10-CM | POA: Diagnosis not present

## 2016-09-04 DIAGNOSIS — M069 Rheumatoid arthritis, unspecified: Secondary | ICD-10-CM | POA: Diagnosis not present

## 2016-09-04 DIAGNOSIS — M159 Polyosteoarthritis, unspecified: Secondary | ICD-10-CM | POA: Diagnosis not present

## 2016-09-13 ENCOUNTER — Encounter: Payer: Self-pay | Admitting: *Deleted

## 2016-09-14 DIAGNOSIS — H26492 Other secondary cataract, left eye: Secondary | ICD-10-CM | POA: Diagnosis not present

## 2016-10-04 ENCOUNTER — Ambulatory Visit: Payer: PPO | Admitting: Cardiovascular Disease

## 2016-10-06 ENCOUNTER — Ambulatory Visit: Payer: PPO | Admitting: Cardiovascular Disease

## 2016-10-09 ENCOUNTER — Encounter: Payer: Self-pay | Admitting: Cardiovascular Disease

## 2016-10-09 ENCOUNTER — Ambulatory Visit (INDEPENDENT_AMBULATORY_CARE_PROVIDER_SITE_OTHER): Payer: PPO | Admitting: Cardiovascular Disease

## 2016-10-09 VITALS — BP 116/80 | HR 85 | Ht 59.25 in | Wt 156.2 lb

## 2016-10-09 DIAGNOSIS — I255 Ischemic cardiomyopathy: Secondary | ICD-10-CM | POA: Diagnosis not present
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
## 2016-10-09 NOTE — Patient Instructions (Signed)

## 2016-10-16 DIAGNOSIS — Z6832 Body mass index (BMI) 32.0-32.9, adult: Secondary | ICD-10-CM | POA: Diagnosis not present

## 2016-10-16 DIAGNOSIS — M5136 Other intervertebral disc degeneration, lumbar region: Secondary | ICD-10-CM | POA: Diagnosis not present

## 2016-10-16 DIAGNOSIS — G4709 Other insomnia: Secondary | ICD-10-CM | POA: Diagnosis not present

## 2016-10-16 DIAGNOSIS — M150 Primary generalized (osteo)arthritis: Secondary | ICD-10-CM | POA: Diagnosis not present

## 2016-10-16 DIAGNOSIS — M25551 Pain in right hip: Secondary | ICD-10-CM | POA: Diagnosis not present

## 2016-10-16 DIAGNOSIS — E669 Obesity, unspecified: Secondary | ICD-10-CM | POA: Diagnosis not present

## 2016-10-16 DIAGNOSIS — R600 Localized edema: Secondary | ICD-10-CM | POA: Diagnosis not present

## 2016-10-16 DIAGNOSIS — M0589 Other rheumatoid arthritis with rheumatoid factor of multiple sites: Secondary | ICD-10-CM | POA: Diagnosis not present

## 2016-10-25 DIAGNOSIS — M7061 Trochanteric bursitis, right hip: Secondary | ICD-10-CM | POA: Diagnosis not present

## 2016-10-25 DIAGNOSIS — M7581 Other shoulder lesions, right shoulder: Secondary | ICD-10-CM | POA: Diagnosis not present

## 2016-10-25 DIAGNOSIS — M7582 Other shoulder lesions, left shoulder: Secondary | ICD-10-CM | POA: Diagnosis not present

## 2016-11-02 DIAGNOSIS — M7581 Other shoulder lesions, right shoulder: Secondary | ICD-10-CM | POA: Diagnosis not present

## 2016-11-02 DIAGNOSIS — M7061 Trochanteric bursitis, right hip: Secondary | ICD-10-CM | POA: Diagnosis not present

## 2016-11-02 DIAGNOSIS — M7582 Other shoulder lesions, left shoulder: Secondary | ICD-10-CM | POA: Diagnosis not present

## 2016-11-08 DIAGNOSIS — M7582 Other shoulder lesions, left shoulder: Secondary | ICD-10-CM | POA: Diagnosis not present

## 2016-11-08 DIAGNOSIS — M7581 Other shoulder lesions, right shoulder: Secondary | ICD-10-CM | POA: Diagnosis not present

## 2016-11-08 DIAGNOSIS — M7061 Trochanteric bursitis, right hip: Secondary | ICD-10-CM | POA: Diagnosis not present

## 2016-11-13 DIAGNOSIS — M7061 Trochanteric bursitis, right hip: Secondary | ICD-10-CM | POA: Diagnosis not present

## 2016-11-13 DIAGNOSIS — M7581 Other shoulder lesions, right shoulder: Secondary | ICD-10-CM | POA: Diagnosis not present

## 2016-11-13 DIAGNOSIS — M7582 Other shoulder lesions, left shoulder: Secondary | ICD-10-CM | POA: Diagnosis not present

## 2016-11-15 DIAGNOSIS — M7061 Trochanteric bursitis, right hip: Secondary | ICD-10-CM | POA: Diagnosis not present

## 2016-11-15 DIAGNOSIS — M7581 Other shoulder lesions, right shoulder: Secondary | ICD-10-CM | POA: Diagnosis not present

## 2016-11-15 DIAGNOSIS — M7582 Other shoulder lesions, left shoulder: Secondary | ICD-10-CM | POA: Diagnosis not present

## 2016-11-20 DIAGNOSIS — M7582 Other shoulder lesions, left shoulder: Secondary | ICD-10-CM | POA: Diagnosis not present

## 2016-11-20 DIAGNOSIS — M7061 Trochanteric bursitis, right hip: Secondary | ICD-10-CM | POA: Diagnosis not present

## 2016-11-20 DIAGNOSIS — M7581 Other shoulder lesions, right shoulder: Secondary | ICD-10-CM | POA: Diagnosis not present

## 2016-11-22 DIAGNOSIS — M7582 Other shoulder lesions, left shoulder: Secondary | ICD-10-CM | POA: Diagnosis not present

## 2016-11-22 DIAGNOSIS — M7061 Trochanteric bursitis, right hip: Secondary | ICD-10-CM | POA: Diagnosis not present

## 2016-11-22 DIAGNOSIS — M7581 Other shoulder lesions, right shoulder: Secondary | ICD-10-CM | POA: Diagnosis not present

## 2016-11-29 DIAGNOSIS — M7582 Other shoulder lesions, left shoulder: Secondary | ICD-10-CM | POA: Diagnosis not present

## 2016-11-29 DIAGNOSIS — M7061 Trochanteric bursitis, right hip: Secondary | ICD-10-CM | POA: Diagnosis not present

## 2016-11-29 DIAGNOSIS — M7581 Other shoulder lesions, right shoulder: Secondary | ICD-10-CM | POA: Diagnosis not present

## 2016-11-30 DIAGNOSIS — M7061 Trochanteric bursitis, right hip: Secondary | ICD-10-CM | POA: Diagnosis not present

## 2016-12-06 DIAGNOSIS — M7582 Other shoulder lesions, left shoulder: Secondary | ICD-10-CM | POA: Diagnosis not present

## 2016-12-06 DIAGNOSIS — M7061 Trochanteric bursitis, right hip: Secondary | ICD-10-CM | POA: Diagnosis not present

## 2016-12-06 DIAGNOSIS — M7581 Other shoulder lesions, right shoulder: Secondary | ICD-10-CM | POA: Diagnosis not present

## 2016-12-08 DIAGNOSIS — M7582 Other shoulder lesions, left shoulder: Secondary | ICD-10-CM | POA: Diagnosis not present

## 2016-12-08 DIAGNOSIS — M7061 Trochanteric bursitis, right hip: Secondary | ICD-10-CM | POA: Diagnosis not present

## 2016-12-08 DIAGNOSIS — M7581 Other shoulder lesions, right shoulder: Secondary | ICD-10-CM | POA: Diagnosis not present

## 2016-12-11 DIAGNOSIS — R05 Cough: Secondary | ICD-10-CM | POA: Diagnosis not present

## 2016-12-11 DIAGNOSIS — J3089 Other allergic rhinitis: Secondary | ICD-10-CM | POA: Diagnosis not present

## 2016-12-11 DIAGNOSIS — J300 Vasomotor rhinitis: Secondary | ICD-10-CM | POA: Diagnosis not present

## 2016-12-13 DIAGNOSIS — M7582 Other shoulder lesions, left shoulder: Secondary | ICD-10-CM | POA: Diagnosis not present

## 2016-12-13 DIAGNOSIS — M7581 Other shoulder lesions, right shoulder: Secondary | ICD-10-CM | POA: Diagnosis not present

## 2016-12-13 DIAGNOSIS — M7061 Trochanteric bursitis, right hip: Secondary | ICD-10-CM | POA: Diagnosis not present

## 2016-12-19 DIAGNOSIS — M7581 Other shoulder lesions, right shoulder: Secondary | ICD-10-CM | POA: Diagnosis not present

## 2016-12-19 DIAGNOSIS — M7582 Other shoulder lesions, left shoulder: Secondary | ICD-10-CM | POA: Diagnosis not present

## 2016-12-19 DIAGNOSIS — M7061 Trochanteric bursitis, right hip: Secondary | ICD-10-CM | POA: Diagnosis not present

## 2016-12-22 DIAGNOSIS — M7581 Other shoulder lesions, right shoulder: Secondary | ICD-10-CM | POA: Diagnosis not present

## 2016-12-22 DIAGNOSIS — M7582 Other shoulder lesions, left shoulder: Secondary | ICD-10-CM | POA: Diagnosis not present

## 2016-12-22 DIAGNOSIS — M7061 Trochanteric bursitis, right hip: Secondary | ICD-10-CM | POA: Diagnosis not present

## 2016-12-26 DIAGNOSIS — M7582 Other shoulder lesions, left shoulder: Secondary | ICD-10-CM | POA: Diagnosis not present

## 2016-12-26 DIAGNOSIS — M7581 Other shoulder lesions, right shoulder: Secondary | ICD-10-CM | POA: Diagnosis not present

## 2016-12-26 DIAGNOSIS — M7061 Trochanteric bursitis, right hip: Secondary | ICD-10-CM | POA: Diagnosis not present

## 2016-12-29 DIAGNOSIS — M7582 Other shoulder lesions, left shoulder: Secondary | ICD-10-CM | POA: Diagnosis not present

## 2016-12-29 DIAGNOSIS — M7581 Other shoulder lesions, right shoulder: Secondary | ICD-10-CM | POA: Diagnosis not present

## 2016-12-29 DIAGNOSIS — M7061 Trochanteric bursitis, right hip: Secondary | ICD-10-CM | POA: Diagnosis not present

## 2017-01-03 DIAGNOSIS — M7061 Trochanteric bursitis, right hip: Secondary | ICD-10-CM | POA: Diagnosis not present

## 2017-01-03 DIAGNOSIS — M7582 Other shoulder lesions, left shoulder: Secondary | ICD-10-CM | POA: Diagnosis not present

## 2017-01-03 DIAGNOSIS — M7581 Other shoulder lesions, right shoulder: Secondary | ICD-10-CM | POA: Diagnosis not present

## 2017-01-10 DIAGNOSIS — M7061 Trochanteric bursitis, right hip: Secondary | ICD-10-CM | POA: Diagnosis not present

## 2017-01-10 DIAGNOSIS — M7581 Other shoulder lesions, right shoulder: Secondary | ICD-10-CM | POA: Diagnosis not present

## 2017-01-10 DIAGNOSIS — M7582 Other shoulder lesions, left shoulder: Secondary | ICD-10-CM | POA: Diagnosis not present

## 2017-01-15 DIAGNOSIS — M0589 Other rheumatoid arthritis with rheumatoid factor of multiple sites: Secondary | ICD-10-CM | POA: Diagnosis not present

## 2017-01-15 DIAGNOSIS — R600 Localized edema: Secondary | ICD-10-CM | POA: Diagnosis not present

## 2017-01-15 DIAGNOSIS — E669 Obesity, unspecified: Secondary | ICD-10-CM | POA: Diagnosis not present

## 2017-01-15 DIAGNOSIS — M5136 Other intervertebral disc degeneration, lumbar region: Secondary | ICD-10-CM | POA: Diagnosis not present

## 2017-01-15 DIAGNOSIS — Z6832 Body mass index (BMI) 32.0-32.9, adult: Secondary | ICD-10-CM | POA: Diagnosis not present

## 2017-01-15 DIAGNOSIS — M150 Primary generalized (osteo)arthritis: Secondary | ICD-10-CM | POA: Diagnosis not present

## 2017-01-15 DIAGNOSIS — G4709 Other insomnia: Secondary | ICD-10-CM | POA: Diagnosis not present

## 2017-01-15 DIAGNOSIS — M25551 Pain in right hip: Secondary | ICD-10-CM | POA: Diagnosis not present

## 2017-01-31 DIAGNOSIS — M7061 Trochanteric bursitis, right hip: Secondary | ICD-10-CM | POA: Diagnosis not present

## 2017-02-07 ENCOUNTER — Other Ambulatory Visit: Payer: Self-pay | Admitting: Cardiovascular Disease

## 2017-02-07 DIAGNOSIS — Z01419 Encounter for gynecological examination (general) (routine) without abnormal findings: Secondary | ICD-10-CM | POA: Diagnosis not present

## 2017-02-07 DIAGNOSIS — Z1231 Encounter for screening mammogram for malignant neoplasm of breast: Secondary | ICD-10-CM | POA: Diagnosis not present

## 2017-02-07 DIAGNOSIS — Z6831 Body mass index (BMI) 31.0-31.9, adult: Secondary | ICD-10-CM | POA: Diagnosis not present

## 2017-02-07 DIAGNOSIS — N952 Postmenopausal atrophic vaginitis: Secondary | ICD-10-CM | POA: Diagnosis not present

## 2017-03-16 DIAGNOSIS — M7061 Trochanteric bursitis, right hip: Secondary | ICD-10-CM | POA: Diagnosis not present

## 2017-04-06 ENCOUNTER — Telehealth: Payer: Self-pay | Admitting: Cardiovascular Disease

## 2017-04-06 DIAGNOSIS — D1801 Hemangioma of skin and subcutaneous tissue: Secondary | ICD-10-CM | POA: Diagnosis not present

## 2017-04-06 DIAGNOSIS — D225 Melanocytic nevi of trunk: Secondary | ICD-10-CM | POA: Diagnosis not present

## 2017-04-06 DIAGNOSIS — D692 Other nonthrombocytopenic purpura: Secondary | ICD-10-CM | POA: Diagnosis not present

## 2017-04-06 DIAGNOSIS — D2262 Melanocytic nevi of left upper limb, including shoulder: Secondary | ICD-10-CM | POA: Diagnosis not present

## 2017-04-06 DIAGNOSIS — D2271 Melanocytic nevi of right lower limb, including hip: Secondary | ICD-10-CM | POA: Diagnosis not present

## 2017-04-06 DIAGNOSIS — L821 Other seborrheic keratosis: Secondary | ICD-10-CM | POA: Diagnosis not present

## 2017-04-06 NOTE — Telephone Encounter (Signed)
New message    Pt is calling.  Pt c/o medication issue:  1. Name of Medication: valsartan   2. How are you currently taking this medication (dosage and times per day)? 25 mg  3. Are you having a reaction (difficulty breathing--STAT)?   4. What is your medication issue? Medication is being recalled.

## 2017-04-06 NOTE — Telephone Encounter (Signed)
Lmtcb.

## 2017-04-09 NOTE — Telephone Encounter (Signed)
F/u Message ° °Pt returning RN call. Please call back to discuss  °

## 2017-04-09 NOTE — Telephone Encounter (Signed)
Pt states she is taking losartan 50 mg daily--she is not taking valsartan. Pt advised there has been no recall on losartan, she should continue taking this.

## 2017-04-09 NOTE — Telephone Encounter (Signed)
Pt is on losartan 25 mg daily, not losartan 50 mg daily. Pt will continue losartan 25 mg daily

## 2017-04-11 DIAGNOSIS — M7061 Trochanteric bursitis, right hip: Secondary | ICD-10-CM | POA: Diagnosis not present

## 2017-04-18 DIAGNOSIS — G4709 Other insomnia: Secondary | ICD-10-CM | POA: Diagnosis not present

## 2017-04-18 DIAGNOSIS — R600 Localized edema: Secondary | ICD-10-CM | POA: Diagnosis not present

## 2017-04-18 DIAGNOSIS — M7061 Trochanteric bursitis, right hip: Secondary | ICD-10-CM | POA: Diagnosis not present

## 2017-04-18 DIAGNOSIS — M150 Primary generalized (osteo)arthritis: Secondary | ICD-10-CM | POA: Diagnosis not present

## 2017-04-18 DIAGNOSIS — M0589 Other rheumatoid arthritis with rheumatoid factor of multiple sites: Secondary | ICD-10-CM | POA: Diagnosis not present

## 2017-04-18 DIAGNOSIS — Z6832 Body mass index (BMI) 32.0-32.9, adult: Secondary | ICD-10-CM | POA: Diagnosis not present

## 2017-04-18 DIAGNOSIS — M5136 Other intervertebral disc degeneration, lumbar region: Secondary | ICD-10-CM | POA: Diagnosis not present

## 2017-04-18 DIAGNOSIS — M25551 Pain in right hip: Secondary | ICD-10-CM | POA: Diagnosis not present

## 2017-05-10 DIAGNOSIS — M25551 Pain in right hip: Secondary | ICD-10-CM | POA: Diagnosis not present

## 2017-05-10 DIAGNOSIS — S76011D Strain of muscle, fascia and tendon of right hip, subsequent encounter: Secondary | ICD-10-CM | POA: Diagnosis not present

## 2017-05-17 ENCOUNTER — Ambulatory Visit: Payer: Self-pay | Admitting: Orthopedic Surgery

## 2017-05-18 DIAGNOSIS — M5030 Other cervical disc degeneration, unspecified cervical region: Secondary | ICD-10-CM | POA: Diagnosis not present

## 2017-05-18 DIAGNOSIS — M542 Cervicalgia: Secondary | ICD-10-CM | POA: Diagnosis not present

## 2017-05-18 NOTE — Patient Instructions (Addendum)
Andrea Ibarra  05/18/2017   Your procedure is scheduled on: Wednesday 05-23-17  Report to [facility]  Entrance Report to admitting at 315 pm  Call this number if you have problems the morning of surgery  [phone]   Remember: ONLY 1 PERSON MAY GO WITH YOU TO SHORT STAY TO GET  READY MORNING OF YOUR SURGERY.  Do not eat food :After Midnight.clear liquids from midnight until 1145 am day of surgery-then nothing by mouth day of surgery after 1145 am day of surgery.      Take these medicines the morning of surgery with A SIP OF WATER: albuterol inhaler prn and bring inhaler with you, fexofexadine, es tylenol if needed              You may not have any metal on your body including hair pins and              piercings  Do not wear jewelry, make-up, lotions, powders or perfumes, deodorant             Do not wear nail polish.  Do not shave  48 hours prior to surgery.              Men may shave face and neck.   Do not bring valuables to the hospital. [place].  Contacts, dentures or bridgework may not be worn into surgery.  Leave suitcase in the car. After surgery it may be brought to your room.                 Please read over the following fact sheets you were given: _____________________________________________________________________                CLEAR LIQUID DIET   Foods Allowed                                                                     Foods Excluded  Coffee and tea, regular and decaf                             liquids that you cannot  Plain Jell-O in any flavor                                             see through such as: Fruit ices (not with fruit pulp)                                     milk, soups, orange juice  Iced Popsicles                                    All solid food Carbonated beverages, regular and diet  Cranberry, grape and apple  juices Sports drinks like Gatorade Lightly seasoned clear broth or consume(fat free) Sugar, honey syrup  Sample Menu Breakfast                                Lunch                                     Supper Cranberry juice                    Beef broth                            Chicken broth Jell-O                                     Grape juice                           Apple juice Coffee or tea                        Jell-O                                      Popsicle                                                Coffee or tea                        Coffee or tea  _____________________________________________________________________  [facility] - Preparing for Surgery Before surgery, you can play an important role.  Because skin is not sterile, your skin needs to be as free of germs as possible.  You can reduce the number of germs on your skin by washing with CHG (chlorahexidine gluconate) soap before surgery.  CHG is an antiseptic cleaner which kills germs and bonds with the skin to continue killing germs even after washing. Please DO NOT use if you have an allergy to CHG or antibacterial soaps.  If your skin becomes reddened/irritated stop using the CHG and inform your nurse when you arrive at Short Stay. Do not shave (including legs and underarms) for at least 48 hours prior to the first CHG shower.  You may shave your face/neck. Please follow these instructions carefully:  1.  Shower with CHG Soap the night before surgery and the  morning of Surgery.  2.  If you choose to wash your hair, wash your hair first as usual with your  normal  shampoo.  3.  After you shampoo, rinse your hair and body thoroughly to remove the  shampoo.                           4.  Use CHG as you would any other liquid soap.  You can apply chg directly  to the skin and wash  Gently with a scrungie or clean washcloth.  5.  Apply the CHG Soap to your body ONLY FROM THE NECK DOWN.   Do not  use on face/ open                           Wound or open sores. Avoid contact with eyes, ears mouth and genitals (private parts).                       Wash face,  Genitals (private parts) with your normal soap.             6.  Wash thoroughly, paying special attention to the area where your surgery  will be performed.  7.  Thoroughly rinse your body with warm water from the neck down.  8.  DO NOT shower/wash with your normal soap after using and rinsing off  the CHG Soap.                9.  Pat yourself dry with a clean towel.            10.  Wear clean pajamas.            11.  Place clean sheets on your bed the night of your first shower and do not  sleep with pets. Day of Surgery : Do not apply any lotions/deodorants the morning of surgery.  Please wear clean clothes to the hospital/surgery center.  FAILURE TO FOLLOW THESE INSTRUCTIONS MAY RESULT IN THE CANCELLATION OF YOUR SURGERY PATIENT SIGNATURE_________________________________  NURSE SIGNATURE__________________________________  ________________________________________________________________________   Adam Phenix  An incentive spirometer is a tool that can help keep your lungs clear and active. This tool measures how well you are filling your lungs with each breath. Taking long deep breaths may help reverse or decrease the chance of developing breathing (pulmonary) problems (especially infection) following:  A long period of time when you are unable to move or be active. BEFORE THE PROCEDURE   If the spirometer includes an indicator to show your best effort, your nurse or respiratory therapist will set it to a desired goal.  If possible, sit up straight or lean slightly forward. Try not to slouch.  Hold the incentive spirometer in an upright position. INSTRUCTIONS FOR USE  1. Sit on the edge of your bed if possible, or sit up as far as you can in bed or on a chair. 2. Hold the incentive spirometer in an upright  position. 3. Breathe out normally. 4. Place the mouthpiece in your mouth and seal your lips tightly around it. 5. Breathe in slowly and as deeply as possible, raising the piston or the ball toward the top of the column. 6. Hold your breath for 3-5 seconds or for as long as possible. Allow the piston or ball to fall to the bottom of the column. 7. Remove the mouthpiece from your mouth and breathe out normally. 8. Rest for a few seconds and repeat Steps 1 through 7 at least 10 times every 1-2 hours when you are awake. Take your time and take a few normal breaths between deep breaths. 9. The spirometer may include an indicator to show your best effort. Use the indicator as a goal to work toward during each repetition. 10. After each set of 10 deep breaths, practice coughing to be sure your lungs are clear. If you have an incision (the cut made at the time of  surgery), support your incision when coughing by placing a pillow or rolled up towels firmly against it. Once you are able to get out of bed, walk around indoors and cough well. You may stop using the incentive spirometer when instructed by your caregiver.  RISKS AND COMPLICATIONS  Take your time so you do not get dizzy or light-headed.  If you are in pain, you may need to take or ask for pain medication before doing incentive spirometry. It is harder to take a deep breath if you are having pain. AFTER USE  Rest and breathe slowly and easily.  It can be helpful to keep track of a log of your progress. Your caregiver can provide you with a simple table to help with this. If you are using the spirometer at home, follow these instructions: [place] IF:   You are having difficultly using the spirometer.  You have trouble using the spirometer as often as instructed.  Your pain medication is not giving enough relief while using the spirometer.  You develop fever of 100.5 F (38.1 C) or higher. SEEK IMMEDIATE MEDICAL CARE IF:    You cough up bloody sputum that had not been present before.  You develop fever of 102 F (38.9 C) or greater.  You develop worsening pain at or near the incision site. MAKE SURE YOU:   Understand these instructions.  Will watch your condition.  Will get help right away if you are not doing well or get worse. Document Released: 01/15/2007 Document Revised: 11/27/2011 Document Reviewed: 03/18/2007 [employer] Patient Information 2014 [place].   ________________________________________________________________________   Incentive Spirometer  An incentive spirometer is a tool that can help keep your lungs clear and active. This tool measures how well you are filling your lungs with each breath. Taking long deep breaths may help reverse or decrease the chance of developing breathing (pulmonary) problems (especially infection) following:  A long period of time when you are unable to move or be active. BEFORE THE PROCEDURE   If the spirometer includes an indicator to show your best effort, your nurse or respiratory therapist will set it to a desired goal.  If possible, sit up straight or lean slightly forward. Try not to slouch.  Hold the incentive spirometer in an upright position. INSTRUCTIONS FOR USE  11. Sit on the edge of your bed if possible, or sit up as far as you can in bed or on a chair. 12. Hold the incentive spirometer in an upright position. 13. Breathe out normally. 14. Place the mouthpiece in your mouth and seal your lips tightly around it. 15. Breathe in slowly and as deeply as possible, raising the piston or the ball toward the top of the column. 16. Hold your breath for 3-5 seconds or for as long as possible. Allow the piston or ball to fall to the bottom of the column. 17. Remove the mouthpiece from your mouth and breathe out normally. 18. Rest for a few seconds and repeat Steps 1 through 7 at least 10 times every 1-2 hours when you are awake. Take your  time and take a few normal breaths between deep breaths. 19. The spirometer may include an indicator to show your best effort. Use the indicator as a goal to work toward during each repetition. 20. After each set of 10 deep breaths, practice coughing to be sure your lungs are clear. If you have an incision (the cut made at the time of surgery), support your incision when  coughing by placing a pillow or rolled up towels firmly against it. Once you are able to get out of bed, walk around indoors and cough well. You may stop using the incentive spirometer when instructed by your caregiver.  RISKS AND COMPLICATIONS  Take your time so you do not get dizzy or light-headed.  If you are in pain, you may need to take or ask for pain medication before doing incentive spirometry. It is harder to take a deep breath if you are having pain. AFTER USE  Rest and breathe slowly and easily.  It can be helpful to keep track of a log of your progress. Your caregiver can provide you with a simple table to help with this. If you are using the spirometer at home, follow these instructions: [place] IF:   You are having difficultly using the spirometer.  You have trouble using the spirometer as often as instructed.  Your pain medication is not giving enough relief while using the spirometer.  You develop fever of 100.5 F (38.1 C) or higher. SEEK IMMEDIATE MEDICAL CARE IF:   You cough up bloody sputum that had not been present before.  You develop fever of 102 F (38.9 C) or greater.  You develop worsening pain at or near the incision site. MAKE SURE YOU:   Understand these instructions.  Will watch your condition.  Will get help right away if you are not doing well or get worse. Document Released: 01/15/2007 Document Revised: 11/27/2011 Document Reviewed: 03/18/2007 [facility] Patient Information 2014 [place],  [place].   ________________________________________________________________________

## 2017-05-18 NOTE — Progress Notes (Signed)
Cardiology lov 10-09-16 dr cooper epic ekg 10-09-16 epic Echo 02-25-16 epic

## 2017-05-22 ENCOUNTER — Encounter (HOSPITAL_COMMUNITY)
Admission: RE | Admit: 2017-05-22 | Discharge: 2017-05-22 | Disposition: A | Discharge status: Home / Self Care | Payer: PPO | Source: Ambulatory Visit | Attending: Orthopedic Surgery | Admitting: Orthopedic Surgery

## 2017-05-22 ENCOUNTER — Encounter (HOSPITAL_COMMUNITY): Payer: Self-pay

## 2017-05-22 DIAGNOSIS — S76311A Strain of muscle, fascia and tendon of the posterior muscle group at thigh level, right thigh, initial encounter: Secondary | ICD-10-CM | POA: Diagnosis not present

## 2017-05-22 DIAGNOSIS — M069 Rheumatoid arthritis, unspecified: Secondary | ICD-10-CM | POA: Diagnosis not present

## 2017-05-22 DIAGNOSIS — Z880 Allergy status to penicillin: Secondary | ICD-10-CM | POA: Diagnosis not present

## 2017-05-22 DIAGNOSIS — Z90710 Acquired absence of both cervix and uterus: Secondary | ICD-10-CM | POA: Insufficient documentation

## 2017-05-22 DIAGNOSIS — I252 Old myocardial infarction: Secondary | ICD-10-CM | POA: Insufficient documentation

## 2017-05-22 DIAGNOSIS — Z7982 Long term (current) use of aspirin: Secondary | ICD-10-CM | POA: Insufficient documentation

## 2017-05-22 DIAGNOSIS — Z79899 Other long term (current) drug therapy: Secondary | ICD-10-CM | POA: Insufficient documentation

## 2017-05-22 DIAGNOSIS — Y929 Unspecified place or not applicable: Secondary | ICD-10-CM | POA: Diagnosis not present

## 2017-05-22 DIAGNOSIS — Z881 Allergy status to other antibiotic agents status: Secondary | ICD-10-CM | POA: Insufficient documentation

## 2017-05-22 DIAGNOSIS — Z882 Allergy status to sulfonamides status: Secondary | ICD-10-CM | POA: Diagnosis not present

## 2017-05-22 DIAGNOSIS — M797 Fibromyalgia: Secondary | ICD-10-CM | POA: Insufficient documentation

## 2017-05-22 DIAGNOSIS — J45909 Unspecified asthma, uncomplicated: Secondary | ICD-10-CM | POA: Diagnosis not present

## 2017-05-22 DIAGNOSIS — Z87891 Personal history of nicotine dependence: Secondary | ICD-10-CM | POA: Diagnosis not present

## 2017-05-22 DIAGNOSIS — I10 Essential (primary) hypertension: Secondary | ICD-10-CM | POA: Insufficient documentation

## 2017-05-22 DIAGNOSIS — X58XXXA Exposure to other specified factors, initial encounter: Secondary | ICD-10-CM | POA: Insufficient documentation

## 2017-05-22 DIAGNOSIS — Z955 Presence of coronary angioplasty implant and graft: Secondary | ICD-10-CM | POA: Diagnosis not present

## 2017-05-22 DIAGNOSIS — M7061 Trochanteric bursitis, right hip: Principal | ICD-10-CM | POA: Insufficient documentation

## 2017-05-22 DIAGNOSIS — I2510 Atherosclerotic heart disease of native coronary artery without angina pectoris: Secondary | ICD-10-CM | POA: Insufficient documentation

## 2017-05-22 DIAGNOSIS — E785 Hyperlipidemia, unspecified: Secondary | ICD-10-CM | POA: Insufficient documentation

## 2017-05-22 DIAGNOSIS — M7071 Other bursitis of hip, right hip: Secondary | ICD-10-CM | POA: Diagnosis present

## 2017-05-22 HISTORY — DX: Unspecified osteoarthritis, unspecified site: M19.90

## 2017-05-22 HISTORY — DX: Bursopathy, unspecified: M71.9

## 2017-05-22 LAB — BASIC METABOLIC PANEL
ANION GAP: 8 (ref 5–15)
BUN: 15 mg/dL (ref 6–20)
CHLORIDE: 104 mmol/L (ref 101–111)
CO2: 27 mmol/L (ref 22–32)
Calcium: 9.5 mg/dL (ref 8.9–10.3)
Creatinine, Ser: 0.9 mg/dL (ref 0.44–1.00)
GFR calc non Af Amer: >60 mL/min (ref >60)
Glucose, Bld: 107 mg/dL — ABNORMAL HIGH (ref 65–99)
POTASSIUM: 4.5 mmol/L (ref 3.5–5.1)
Sodium: 139 mmol/L (ref 135–145)

## 2017-05-22 LAB — CBC
HCT: 40.1 % (ref 36.0–46.0)
HEMOGLOBIN: 13.8 g/dL (ref 12.0–15.0)
MCH: 32.2 pg (ref 26.0–34.0)
MCHC: 34.4 g/dL (ref 30.0–36.0)
MCV: 93.5 fL (ref 78.0–100.0)
Platelets: 197 10*3/uL (ref 150–400)
RBC: 4.29 MIL/uL (ref 3.87–5.11)
RDW: 13 % (ref 11.5–15.5)
WBC: 7.2 10*3/uL (ref 4.0–10.5)

## 2017-05-23 ENCOUNTER — Encounter (HOSPITAL_COMMUNITY): Payer: Self-pay | Admitting: *Deleted

## 2017-05-23 ENCOUNTER — Encounter (HOSPITAL_COMMUNITY)
Admission: RE | Disposition: A | Discharge status: Home / Self Care | Payer: Self-pay | Source: Ambulatory Visit | Attending: Orthopedic Surgery

## 2017-05-23 ENCOUNTER — Observation Stay (HOSPITAL_COMMUNITY)
Admission: RE | Admit: 2017-05-23 | Discharge: 2017-05-24 | Disposition: A | Discharge status: Home / Self Care | Payer: PPO | Source: Ambulatory Visit | Attending: Orthopedic Surgery | Admitting: Orthopedic Surgery

## 2017-05-23 ENCOUNTER — Ambulatory Visit (HOSPITAL_COMMUNITY): Payer: PPO | Admitting: Registered Nurse

## 2017-05-23 DIAGNOSIS — M67951 Unspecified disorder of synovium and tendon, right thigh: Secondary | ICD-10-CM | POA: Diagnosis present

## 2017-05-23 DIAGNOSIS — I509 Heart failure, unspecified: Secondary | ICD-10-CM | POA: Diagnosis not present

## 2017-05-23 DIAGNOSIS — M797 Fibromyalgia: Secondary | ICD-10-CM | POA: Diagnosis not present

## 2017-05-23 DIAGNOSIS — Z7982 Long term (current) use of aspirin: Secondary | ICD-10-CM | POA: Diagnosis not present

## 2017-05-23 DIAGNOSIS — Z955 Presence of coronary angioplasty implant and graft: Secondary | ICD-10-CM | POA: Diagnosis not present

## 2017-05-23 DIAGNOSIS — I110 Hypertensive heart disease with heart failure: Secondary | ICD-10-CM | POA: Diagnosis not present

## 2017-05-23 DIAGNOSIS — Z90710 Acquired absence of both cervix and uterus: Secondary | ICD-10-CM | POA: Diagnosis not present

## 2017-05-23 DIAGNOSIS — J45909 Unspecified asthma, uncomplicated: Secondary | ICD-10-CM | POA: Diagnosis not present

## 2017-05-23 DIAGNOSIS — I10 Essential (primary) hypertension: Secondary | ICD-10-CM | POA: Diagnosis not present

## 2017-05-23 DIAGNOSIS — I2510 Atherosclerotic heart disease of native coronary artery without angina pectoris: Secondary | ICD-10-CM | POA: Diagnosis not present

## 2017-05-23 DIAGNOSIS — E785 Hyperlipidemia, unspecified: Secondary | ICD-10-CM | POA: Diagnosis not present

## 2017-05-23 DIAGNOSIS — M7071 Other bursitis of hip, right hip: Secondary | ICD-10-CM | POA: Diagnosis not present

## 2017-05-23 DIAGNOSIS — S76311A Strain of muscle, fascia and tendon of the posterior muscle group at thigh level, right thigh, initial encounter: Secondary | ICD-10-CM | POA: Diagnosis not present

## 2017-05-23 DIAGNOSIS — M069 Rheumatoid arthritis, unspecified: Secondary | ICD-10-CM | POA: Diagnosis not present

## 2017-05-23 DIAGNOSIS — M67853 Other specified disorders of tendon, right hip: Secondary | ICD-10-CM | POA: Diagnosis not present

## 2017-05-23 DIAGNOSIS — M6798 Unspecified disorder of synovium and tendon, other site: Secondary | ICD-10-CM | POA: Diagnosis present

## 2017-05-23 DIAGNOSIS — S76011A Strain of muscle, fascia and tendon of right hip, initial encounter: Secondary | ICD-10-CM | POA: Diagnosis not present

## 2017-05-23 DIAGNOSIS — M7061 Trochanteric bursitis, right hip: Secondary | ICD-10-CM | POA: Diagnosis not present

## 2017-05-23 DIAGNOSIS — I252 Old myocardial infarction: Secondary | ICD-10-CM | POA: Diagnosis not present

## 2017-05-23 HISTORY — PX: EXCISION/RELEASE BURSA HIP: SHX5014

## 2017-05-23 SURGERY — RELEASE, BURSA, TROCHANTERIC
Anesthesia: General | Site: Hip | Laterality: Right

## 2017-05-23 MED ORDER — TRAZODONE 25 MG HALF TABLET
25.0000 mg | ORAL_TABLET | Freq: Every day | ORAL | Status: DC
  Filled 2017-05-23 (×2): qty 1

## 2017-05-23 MED ORDER — AZELASTINE HCL 0.1 % NA SOLN: 1.0000 | Freq: Two times a day (BID) | NASAL | Status: DC | PRN

## 2017-05-23 MED ORDER — ALBUTEROL SULFATE (2.5 MG/3ML) 0.083% IN NEBU: 2.5000 mg | INHALATION_SOLUTION | Freq: Four times a day (QID) | RESPIRATORY_TRACT | Status: DC | PRN

## 2017-05-23 MED ORDER — ROSUVASTATIN CALCIUM 10 MG PO TABS
10.0000 mg | ORAL_TABLET | Freq: Every day | ORAL | Status: DC
  Administered 2017-05-23: 10 mg via ORAL
  Filled 2017-05-23: qty 1

## 2017-05-23 MED ORDER — SODIUM CHLORIDE 0.9 % IJ SOLN
INTRAMUSCULAR | Status: AC
  Filled 2017-05-23: qty 50

## 2017-05-23 MED ORDER — PROMETHAZINE HCL 25 MG/ML IJ SOLN
INTRAMUSCULAR | Status: AC
  Filled 2017-05-23: qty 1

## 2017-05-23 MED ORDER — FENTANYL CITRATE (PF) 100 MCG/2ML IJ SOLN
25.0000 ug | INTRAMUSCULAR | Status: DC | PRN
  Administered 2017-05-23 (×2): 25 ug via INTRAVENOUS
  Administered 2017-05-23: 50 ug via INTRAVENOUS
  Administered 2017-05-23: 25 ug via INTRAVENOUS

## 2017-05-23 MED ORDER — TRIAMTERENE-HCTZ 37.5-25 MG PO TABS: 0.5000 | ORAL_TABLET | Freq: Every day | ORAL | Status: DC | PRN

## 2017-05-23 MED ORDER — ONDANSETRON HCL 4 MG/2ML IJ SOLN
INTRAMUSCULAR | Status: DC | PRN
Start: 2017-05-23 — End: 2017-05-23
  Administered 2017-05-23: 4 mg via INTRAVENOUS

## 2017-05-23 MED ORDER — DEXAMETHASONE SODIUM PHOSPHATE 10 MG/ML IJ SOLN
10.0000 mg | Freq: Once | INTRAMUSCULAR | Status: AC
  Administered 2017-05-23: 10 mg via INTRAVENOUS

## 2017-05-23 MED ORDER — PROPOFOL 10 MG/ML IV BOLUS
INTRAVENOUS | Status: DC | PRN
  Administered 2017-05-23: 120 mg via INTRAVENOUS

## 2017-05-23 MED ORDER — ONDANSETRON HCL 4 MG/2ML IJ SOLN: 4.0000 mg | Freq: Four times a day (QID) | INTRAMUSCULAR | Status: DC | PRN

## 2017-05-23 MED ORDER — MIDAZOLAM HCL 2 MG/2ML IJ SOLN
INTRAMUSCULAR | Status: DC | PRN
  Administered 2017-05-23 (×2): 0.5 mg via INTRAVENOUS

## 2017-05-23 MED ORDER — FENTANYL CITRATE (PF) 100 MCG/2ML IJ SOLN
INTRAMUSCULAR | Status: DC | PRN
  Administered 2017-05-23 (×2): 50 ug via INTRAVENOUS

## 2017-05-23 MED ORDER — METHOCARBAMOL 500 MG PO TABS
500.0000 mg | ORAL_TABLET | Freq: Four times a day (QID) | ORAL | Status: DC | PRN
Start: 2017-05-23 — End: 2017-05-24

## 2017-05-23 MED ORDER — METOCLOPRAMIDE HCL 5 MG/ML IJ SOLN: 5.0000 mg | Freq: Three times a day (TID) | INTRAMUSCULAR | Status: DC | PRN

## 2017-05-23 MED ORDER — 0.9 % SODIUM CHLORIDE (POUR BTL) OPTIME
TOPICAL | Status: DC | PRN
  Administered 2017-05-23: 1000 mL

## 2017-05-23 MED ORDER — METOCLOPRAMIDE HCL 5 MG PO TABS: 5.0000 mg | ORAL_TABLET | Freq: Three times a day (TID) | ORAL | Status: DC | PRN

## 2017-05-23 MED ORDER — PROMETHAZINE HCL 25 MG/ML IJ SOLN
6.2500 mg | INTRAMUSCULAR | Status: DC | PRN
  Administered 2017-05-23: 6.25 mg via INTRAVENOUS

## 2017-05-23 MED ORDER — ONDANSETRON HCL 4 MG/2ML IJ SOLN
INTRAMUSCULAR | Status: AC
  Filled 2017-05-23: qty 2

## 2017-05-23 MED ORDER — BUPIVACAINE LIPOSOME 1.3 % IJ SUSP
20.0000 mL | Freq: Once | INTRAMUSCULAR | Status: DC
  Filled 2017-05-23: qty 20

## 2017-05-23 MED ORDER — ONDANSETRON HCL 4 MG PO TABS: 4.0000 mg | ORAL_TABLET | Freq: Four times a day (QID) | ORAL | Status: DC | PRN

## 2017-05-23 MED ORDER — BUPIVACAINE HCL 0.25 % IJ SOLN
INTRAMUSCULAR | Status: DC | PRN
  Administered 2017-05-23: 30 mL

## 2017-05-23 MED ORDER — LIDOCAINE 2% (20 MG/ML) 5 ML SYRINGE
INTRAMUSCULAR | Status: DC | PRN
  Administered 2017-05-23: 60 mg via INTRAVENOUS

## 2017-05-23 MED ORDER — ASPIRIN EC 81 MG PO TBEC
81.0000 mg | DELAYED_RELEASE_TABLET | Freq: Every day | ORAL | Status: DC
  Administered 2017-05-24: 81 mg via ORAL
  Filled 2017-05-23: qty 1

## 2017-05-23 MED ORDER — FENTANYL CITRATE (PF) 100 MCG/2ML IJ SOLN
INTRAMUSCULAR | Status: AC
  Filled 2017-05-23: qty 2

## 2017-05-23 MED ORDER — SUGAMMADEX SODIUM 200 MG/2ML IV SOLN
INTRAVENOUS | Status: DC | PRN
  Administered 2017-05-23: 200 mg via INTRAVENOUS

## 2017-05-23 MED ORDER — SUGAMMADEX SODIUM 200 MG/2ML IV SOLN
INTRAVENOUS | Status: AC
  Filled 2017-05-23: qty 2

## 2017-05-23 MED ORDER — EPHEDRINE SULFATE 50 MG/ML IJ SOLN
INTRAMUSCULAR | Status: DC | PRN
  Administered 2017-05-23 (×3): 5 mg via INTRAVENOUS

## 2017-05-23 MED ORDER — ROCURONIUM BROMIDE 10 MG/ML (PF) SYRINGE
PREFILLED_SYRINGE | INTRAVENOUS | Status: DC | PRN
  Administered 2017-05-23: 40 mg via INTRAVENOUS

## 2017-05-23 MED ORDER — SODIUM CHLORIDE 0.9 % IV SOLN
INTRAVENOUS | Status: DC
  Administered 2017-05-23: 23:00:00 via INTRAVENOUS

## 2017-05-23 MED ORDER — FOLIC ACID 1 MG PO TABS
2.0000 mg | ORAL_TABLET | Freq: Every day | ORAL | Status: DC
  Administered 2017-05-24: 2 mg via ORAL
  Filled 2017-05-23: qty 2

## 2017-05-23 MED ORDER — LACTATED RINGERS IV SOLN
INTRAVENOUS | Status: DC
  Administered 2017-05-23 (×3): via INTRAVENOUS

## 2017-05-23 MED ORDER — ENOXAPARIN SODIUM 40 MG/0.4ML ~~LOC~~ SOLN
40.0000 mg | SUBCUTANEOUS | Status: DC
  Administered 2017-05-24: 40 mg via SUBCUTANEOUS
  Filled 2017-05-23: qty 0.4

## 2017-05-23 MED ORDER — SODIUM CHLORIDE 0.9 % IJ SOLN
INTRAMUSCULAR | Status: DC | PRN
  Administered 2017-05-23: 50 mL

## 2017-05-23 MED ORDER — LORATADINE 10 MG PO TABS: 10.0000 mg | ORAL_TABLET | Freq: Every day | ORAL | Status: DC | PRN

## 2017-05-23 MED ORDER — PHENYLEPHRINE 40 MCG/ML (10ML) SYRINGE FOR IV PUSH (FOR BLOOD PRESSURE SUPPORT)
PREFILLED_SYRINGE | INTRAVENOUS | Status: DC | PRN
  Administered 2017-05-23 (×2): 120 ug via INTRAVENOUS
  Administered 2017-05-23 (×2): 80 ug via INTRAVENOUS

## 2017-05-23 MED ORDER — MIDAZOLAM HCL 2 MG/2ML IJ SOLN
INTRAMUSCULAR | Status: AC
  Filled 2017-05-23: qty 2

## 2017-05-23 MED ORDER — TRAMADOL HCL 50 MG PO TABS
50.0000 mg | ORAL_TABLET | Freq: Four times a day (QID) | ORAL | Status: DC | PRN
  Administered 2017-05-24: 100 mg via ORAL
  Filled 2017-05-23: qty 2

## 2017-05-23 MED ORDER — VANCOMYCIN HCL IN DEXTROSE 1-5 GM/200ML-% IV SOLN
1000.0000 mg | INTRAVENOUS | Status: AC
  Administered 2017-05-23: 1000 mg via INTRAVENOUS

## 2017-05-23 MED ORDER — LIDOCAINE 2% (20 MG/ML) 5 ML SYRINGE
INTRAMUSCULAR | Status: AC
  Filled 2017-05-23: qty 5

## 2017-05-23 MED ORDER — BUPIVACAINE LIPOSOME 1.3 % IJ SUSP
INTRAMUSCULAR | Status: DC | PRN
  Administered 2017-05-23: 20 mL

## 2017-05-23 MED ORDER — METHOCARBAMOL 1000 MG/10ML IJ SOLN
500.0000 mg | Freq: Four times a day (QID) | INTRAVENOUS | Status: DC | PRN
  Administered 2017-05-23: 500 mg via INTRAVENOUS
  Filled 2017-05-23: qty 550

## 2017-05-23 MED ORDER — DEXAMETHASONE SODIUM PHOSPHATE 10 MG/ML IJ SOLN
INTRAMUSCULAR | Status: AC
  Filled 2017-05-23: qty 1

## 2017-05-23 MED ORDER — POVIDONE-IODINE 10 % EX SWAB: 2.0000 "application " | Freq: Once | CUTANEOUS | Status: DC

## 2017-05-23 MED ORDER — LOSARTAN POTASSIUM 25 MG PO TABS: 25.0000 mg | ORAL_TABLET | Freq: Every evening | ORAL | Status: DC

## 2017-05-23 MED ORDER — HYDROMORPHONE HCL-NACL 0.5-0.9 MG/ML-% IV SOSY: 0.2500 mg | PREFILLED_SYRINGE | INTRAVENOUS | Status: DC | PRN

## 2017-05-23 MED ORDER — HYDROCODONE-ACETAMINOPHEN 5-325 MG PO TABS
1.0000 | ORAL_TABLET | ORAL | Status: DC | PRN
  Administered 2017-05-23: 1 via ORAL
  Filled 2017-05-23 (×2): qty 2

## 2017-05-23 MED ORDER — BUPIVACAINE HCL (PF) 0.25 % IJ SOLN
INTRAMUSCULAR | Status: AC
  Filled 2017-05-23: qty 30

## 2017-05-23 MED ORDER — ACETAMINOPHEN 10 MG/ML IV SOLN
1000.0000 mg | Freq: Once | INTRAVENOUS | Status: AC
  Administered 2017-05-23: 1000 mg via INTRAVENOUS

## 2017-05-23 MED ORDER — ACETAMINOPHEN 10 MG/ML IV SOLN
INTRAVENOUS | Status: AC
  Filled 2017-05-23: qty 100

## 2017-05-23 MED ORDER — PROPOFOL 10 MG/ML IV BOLUS
INTRAVENOUS | Status: AC
  Filled 2017-05-23: qty 20

## 2017-05-23 MED ORDER — VANCOMYCIN HCL IN DEXTROSE 1-5 GM/200ML-% IV SOLN
INTRAVENOUS | Status: AC
  Administered 2017-05-23: 1000 mg via INTRAVENOUS
  Filled 2017-05-23: qty 200

## 2017-05-23 MED ORDER — HYDROMORPHONE HCL-NACL 0.5-0.9 MG/ML-% IV SOSY: 0.5000 mg | PREFILLED_SYRINGE | INTRAVENOUS | Status: DC | PRN

## 2017-05-23 SURGICAL SUPPLY — 41 items
ANCHOR SUPER QUICK (Anchor) ×2 IMPLANT
BAG ZIPLOCK 12X15 (MISCELLANEOUS) IMPLANT
BIT DRILL 2.4X128 (BIT) IMPLANT
BLADE EXTENDED COATED 6.5IN (ELECTRODE) ×2 IMPLANT
DRAPE INCISE IOBAN 66X45 STRL (DRAPES) ×2 IMPLANT
DRAPE ORTHO SPLIT 77X108 STRL (DRAPES) ×2
DRAPE POUCH INSTRU U-SHP 10X18 (DRAPES) ×2 IMPLANT
DRAPE SURG ORHT 6 SPLT 77X108 (DRAPES) ×2 IMPLANT
DRAPE U-SHAPE 47X51 STRL (DRAPES) ×2 IMPLANT
DRSG ADAPTIC 3X8 NADH LF (GAUZE/BANDAGES/DRESSINGS) ×2 IMPLANT
DRSG MEPILEX BORDER 4X4 (GAUZE/BANDAGES/DRESSINGS) ×2 IMPLANT
DRSG MEPILEX BORDER 4X8 (GAUZE/BANDAGES/DRESSINGS) ×2 IMPLANT
DURAPREP 26ML APPLICATOR (WOUND CARE) ×2 IMPLANT
ELECT REM PT RETURN 15FT ADLT (MISCELLANEOUS) ×2 IMPLANT
EVACUATOR 1/8 PVC DRAIN (DRAIN) ×2 IMPLANT
GAUZE SPONGE 4X4 12PLY STRL (GAUZE/BANDAGES/DRESSINGS) ×2 IMPLANT
GLOVE BIO SURGEON STRL SZ7.5 (GLOVE) ×2 IMPLANT
GLOVE BIO SURGEON STRL SZ8 (GLOVE) ×2 IMPLANT
GLOVE BIOGEL PI IND STRL 8 (GLOVE) ×2 IMPLANT
GLOVE BIOGEL PI INDICATOR 8 (GLOVE) ×2
GOWN STRL REUS W/TWL LRG LVL3 (GOWN DISPOSABLE) ×2 IMPLANT
GOWN STRL REUS W/TWL XL LVL3 (GOWN DISPOSABLE) ×2 IMPLANT
KIT BASIN OR (CUSTOM PROCEDURE TRAY) ×2 IMPLANT
MANIFOLD NEPTUNE II (INSTRUMENTS) ×2 IMPLANT
NDL SAFETY ECLIPSE 18X1.5 (NEEDLE) ×2 IMPLANT
NEEDLE HYPO 18GX1.5 SHARP (NEEDLE) ×2
NEEDLE MA TROC 1/2 (NEEDLE) IMPLANT
NS IRRIG 1000ML POUR BTL (IV SOLUTION) ×2 IMPLANT
PACK TOTAL JOINT (CUSTOM PROCEDURE TRAY) ×2 IMPLANT
PASSER SUT SWANSON 36MM LOOP (INSTRUMENTS) IMPLANT
POSITIONER SURGICAL ARM (MISCELLANEOUS) ×2 IMPLANT
STRIP CLOSURE SKIN 1/2X4 (GAUZE/BANDAGES/DRESSINGS) ×2 IMPLANT
SUT ETHIBOND NAB CT1 #1 30IN (SUTURE) IMPLANT
SUT MNCRL AB 4-0 PS2 18 (SUTURE) ×2 IMPLANT
SUT VIC AB 1 CT1 36 (SUTURE) ×4 IMPLANT
SUT VIC AB 2-0 CT1 27 (SUTURE) ×2
SUT VIC AB 2-0 CT1 TAPERPNT 27 (SUTURE) ×2 IMPLANT
SYR 20CC LL (SYRINGE) IMPLANT
SYR 30ML LL (SYRINGE) ×2 IMPLANT
SYR 50ML LL SCALE MARK (SYRINGE) ×2 IMPLANT
TOWEL OR 17X26 10 PK STRL BLUE (TOWEL DISPOSABLE) ×2 IMPLANT
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
## 2017-05-23 NOTE — Anesthesia Preprocedure Evaluation (Signed)
Anesthesia Evaluation  Patient identified by MRN, date of birth, ID band Patient awake    Reviewed: Allergy & Precautions, NPO status , Patient's Chart, lab work & pertinent test results  History of Anesthesia Complications (+) PONV  Airway Mallampati: II  TM Distance: >3 FB Neck ROM: Full    Dental no notable dental hx.    Pulmonary neg pulmonary ROS, former smoker,    Pulmonary exam normal breath sounds clear to auscultation       Cardiovascular hypertension, + CAD, + Past MI and +CHF  Normal cardiovascular exam Rhythm:Regular Rate:Normal     Neuro/Psych negative neurological ROS  negative psych ROS   GI/Hepatic negative GI ROS, Neg liver ROS,   Endo/Other  negative endocrine ROS  Renal/GU negative Renal ROS  negative genitourinary   Musculoskeletal negative musculoskeletal ROS (+)   Abdominal   Peds negative pediatric ROS (+)  Hematology negative hematology ROS (+)   Anesthesia Other Findings   Reproductive/Obstetrics negative OB ROS                             Anesthesia Physical Anesthesia Plan  ASA: II  Anesthesia Plan: General   Post-op Pain Management:    Induction: Intravenous  PONV Risk Score and Plan: 2 and Ondansetron, Dexamethasone and Treatment may vary due to age or medical condition  Airway Management Planned: Oral ETT  Additional Equipment:   Intra-op Plan:   Post-operative Plan: Extubation in OR  Informed Consent: I have reviewed the patients History and Physical, chart, labs and discussed the procedure including the risks, benefits and alternatives for the proposed anesthesia with the patient or authorized representative who has indicated his/her understanding and acceptance.   Dental advisory given  Plan Discussed with: CRNA and Surgeon  Anesthesia Plan Comments:         Anesthesia Quick Evaluation

## 2017-05-23 NOTE — Progress Notes (Addendum)
Called to room by patient . Patient is itching all over and hives are breaking out on torso. Notified Dr Wynelle Link and order to stop vancomycin.

## 2017-05-23 NOTE — Anesthesia Procedure Notes (Addendum)
Procedure Name: Intubation Date/Time: 05/23/2017 6:03 PM Performed by: Cynda Familia Pre-anesthesia Checklist: Patient identified, Emergency Drugs available, Suction available and Patient being monitored Patient Re-evaluated:Patient Re-evaluated prior to induction Oxygen Delivery Method: Circle System Utilized Preoxygenation: Pre-oxygenation with 100% oxygen Induction Type: IV induction Ventilation: Mask ventilation without difficulty Tube type: Oral Number of attempts: 1 Airway Equipment and Method: Stylet Placement Confirmation: ETT inserted through vocal cords under direct vision,  positive ETCO2 and breath sounds checked- equal and bilateral Secured at: 22 cm Tube secured with: Tape Dental Injury: Teeth and Oropharynx as per pre-operative assessment  Comments: Smooth IV induction Rose-- intubation AM CRNA atraumatic-- teeth and mouth as preop-- bilat BS Rose

## 2017-05-23 NOTE — Transfer of Care (Signed)
Immediate Anesthesia Transfer of Care Note  Patient: Andrea Ibarra  Procedure(s) Performed: Procedure(s): Right hip bursectomy; gluteal tendon repair (Right)  Patient Location: PACU  Anesthesia Type:General  Level of Consciousness:  sedated, patient cooperative and responds to stimulation  Airway & Oxygen Therapy:Patient Spontanous Breathing and Patient connected to face mask oxgen  Post-op Assessment:  Report given to PACU RN and Post -op Vital signs reviewed and stable  Post vital signs:  Reviewed and stable  Last Vitals:  Vitals:   05/23/17 1543 05/23/17 1544  BP: 114/77   Pulse:    Resp:    Temp:    SpO2:  25%    Complications: No apparent anesthesia complications

## 2017-05-24 ENCOUNTER — Encounter (HOSPITAL_COMMUNITY): Payer: Self-pay | Admitting: Orthopedic Surgery

## 2017-05-24 DIAGNOSIS — M7061 Trochanteric bursitis, right hip: Secondary | ICD-10-CM | POA: Diagnosis not present

## 2017-05-24 MED ORDER — METHOCARBAMOL 500 MG PO TABS: 500.0000 mg | ORAL_TABLET | Freq: Four times a day (QID) | ORAL | 0 refills | Status: DC | PRN

## 2017-05-24 MED ORDER — HYDROCODONE-ACETAMINOPHEN 5-325 MG PO TABS: 1.0000 | ORAL_TABLET | ORAL | 0 refills | Status: DC | PRN

## 2017-05-24 MED ORDER — TRAMADOL HCL 50 MG PO TABS: 50.0000 mg | ORAL_TABLET | Freq: Four times a day (QID) | ORAL | 0 refills | Status: DC | PRN

## 2017-05-24 NOTE — Care Management Note (Signed)
Case Management Note  Patient Details  Name: Andrea Ibarra MRN: [id number] Date of Birth: 04-29-1941  Subjective/Objective:  76 y/o f admitted w/Tendinopathy R gluteus medius. From home.                  Action/Plan:d/c home.   Expected Discharge Date:  05/24/17               Expected Discharge Plan:  Home/Self Care  In-House Referral:     Discharge planning Services  CM Consult  Post Acute Care Choice:    Choice offered to:     DME Arranged:    DME Agency:     HH Arranged:    HH Agency:     Status of Service:  Completed, signed off  If discussed at [employer] of Stay Meetings, dates discussed:    Additional Comments:  Dessa Phi, RN 05/24/2017, 12:08 PM

## 2017-05-24 NOTE — Progress Notes (Signed)
   Subjective: 1 Day Post-Op Procedure(s) (LRB): Right hip bursectomy; gluteal tendon repair (Right) Patient reports pain as mild.   Patient seen in rounds by Dr. Wynelle Link. Patient is well, but has had some minor complaints of pain in the hip, requiring pain medications Patient is ready to go home  Objective: Vital signs in last 24 hours: Temp:  [97.4 F (36.3 C)-98.1 F (36.7 C)] 97.7 F (36.5 C) (09/06 0200) Pulse Rate:  [67-82] 67 (09/06 0200) Resp:  [12-22] 16 (09/06 0200) BP: (93-151)/(52-84) 127/62 (09/06 0200) SpO2:  [93 %-100 %] 97 % (09/06 0200) Weight:  [68.5 kg (151 lb)] 68.5 kg (151 lb) (09/05 1602)  Intake/Output from previous day:  Intake/Output Summary (Last 24 hours) at 05/24/17 0813 Last data filed at 05/24/17 0200  Gross per 24 hour  Intake             1860 ml  Output              820 ml  Net             1040 ml    Intake/Output this shift: No intake/output data recorded.  EXAM: General - Patient is Alert and Appropriate Extremity - Neurovascular intact Sensation intact distally Intact pulses distally Dressing - clean, dry, no drainage Motor Function - intact, moving foot and toes well on exam.   Assessment/Plan: 1 Day Post-Op Procedure(s) (LRB): Right hip bursectomy; gluteal tendon repair (Right) Procedure(s) (LRB): Right hip bursectomy; gluteal tendon repair (Right) Past Medical History:  Diagnosis Date  . Arthritis    ra and oa  . Asthma    as child  . Bursitis    right hip  . CAD (coronary artery disease)    significant with PTCA, MI in 1996  . Dyslipidemia   . Fibromyalgia   . H/O: hysterectomy   . Headache    migraines years ago  . History of echocardiogram    a. Echo 6/17: Mild concentric LVH, EF 45-50%, inferolateral HK, grade 1 diastolic dysfunction, normal RVSF, mild TR, trivial pericardial effusion  . Hypertension   . Myocardial infarction ([place])    remotely in 1996  . Pneumonia    as teenager  . PONV (postoperative nausea  and vomiting)   . 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 Vertigo    Principal Problem:   Tendinopathy of right gluteus medius  Estimated body mass index is 30.24 kg/m as calculated from the following:   Height as of this encounter: 4' 11.25" (1.505 m).   Weight as of this encounter: 68.5 kg (151 lb).  Diet - Cardiac diet Follow up - in two weeks. Call office for appointment at [phone]. Activity - Weight bearing as tolerated to the surgical leg.  No active abduction of the leg, (no pulling leg out to the side away from the body).  Walker for first several days until comfortable ambulating. May start showering three days following surgery but do not submerge incision under water. Continue to use ice for pain and swelling from surgery.  Baby Aspirin 81 mg daily for three weeks.  Please use walker for the first couple of days until comfortable ambulating.  May start changing dressing tomorrow with dry gauze and tape.  Disposition - Home Condition Upon Discharge - Stable D/C Meds - See DC Summary DVT Prophylaxis - resume Aspirin at home  Arlee Muslim, PA-C Orthopaedic Surgery 05/24/2017, 8:13 AM

## 2017-05-24 NOTE — Progress Notes (Signed)
Pt ready for discharge home. Daughter at bedside. reviewied all d/c instructions and prescriptions. Applied ted hose. Pt w/o any further questions.

## 2017-05-24 NOTE — Discharge Instructions (Signed)
Dr. Gaynelle Arabian [facility] [address] [place], [place] [address] [phone]  Bursectomy / Gluteal Tendon Repair Discharge Instructions  HOME CARE INSTRUCTIONS  Remove items at home which could result in a fall. This includes throw rugs or furniture in walking pathways.   ICE to the affected hip every three hours for 30 minutes at a time and then as needed for pain and swelling.  Continue to use ice on the hip for pain and swelling from surgery. You may notice swelling that will progress down to the foot and ankle.  This is normal after surgery.  Elevate the leg when you are not up walking on it.    Continue to use the breathing machine which will help keep your temperature down.  It is common for your temperature to cycle up and down following surgery, especially at night when you are not up moving around and exerting yourself.  The breathing machine keeps your lungs expanded and your temperature down. Sit on high chairs which makes it easier to stand.  Sit on chairs with arms. Use the chair arms to help push yourself up when arising.   No Active Abduction of the leg (No pulling leg out to the side away from the body).  Use your walker for first several days until comfortable ambulating.  DIET You may resume your previous home diet once your are discharged from the hospital.  DRESSING / WOUND CARE / SHOWERING You may shower 3 days after surgery, but keep the wounds dry during showering.  You may use an occlusive plastic wrap (Press'n Seal for example), NO SOAKING/SUBMERGING IN THE BATHTUB.  If the bandage gets wet, change with a clean dry gauze.  If the incision gets wet, pat the wound dry with a clean towel. You may start showering three days following surgery but do not submerge the incision under water.Just pat the incision dry and apply a dry gauze dressing on daily. Change the surgical dressing daily and reapply a dry  dressing each time.  ACTIVITY Walk with your walker as instructed. Use walker as long as suggested by your caregivers. Avoid periods of inactivity such as sitting longer than an hour when not asleep. This helps prevent blood clots.  You may resume a sexual relationship in one month or when given the OK by your doctor.  You may return to work once you are cleared by your doctor.  Do not drive a car for 6 weeks or until released by you surgeon.  Do not drive while taking narcotics.  WEIGHT BEARING Weight bearing as tolerated with assist device (walker, cane, etc) as directed, use it as long as suggested by your surgeon or therapist, typically at least 4-6 weeks.  POSTOPERATIVE CONSTIPATION PROTOCOL Constipation - defined medically as fewer than three stools per week and severe constipation as less than one stool per week.  One of the most common issues patients have following surgery is constipation.  Even if you have a regular bowel pattern at home, your normal regimen is likely to be disrupted due to multiple reasons following surgery.  Combination of anesthesia, postoperative narcotics, change in appetite and fluid intake all can affect your bowels.  In order to avoid complications following surgery, here are some recommendations in order to help you during your recovery period.  Colace (docusate) - Pick up an over-the-counter form of Colace or another stool softener and take twice a day as long as you are requiring  postoperative pain medications.  Take with a full glass of water daily.  If you experience loose stools or diarrhea, hold the colace until you stool forms back up.  If your symptoms do not get better within 1 week or if they get worse, check with your doctor.  Dulcolax (bisacodyl) - Pick up over-the-counter and take as directed by the product packaging as needed to assist with the movement of your bowels.  Take with a full glass of water.  Use this product as needed if not relieved  by Colace only.   MiraLax (polyethylene glycol) - Pick up over-the-counter to have on hand.  MiraLax is a solution that will increase the amount of water in your bowels to assist with bowel movements.  Take as directed and can mix with a glass of water, juice, soda, coffee, or tea.  Take if you go more than two days without a movement. Do not use MiraLax more than once per day. Call your doctor if you are still constipated or irregular after using this medication for 7 days in a row.  If you continue to have problems with postoperative constipation, please contact the office for further assistance and recommendations.  If you experience "the worst abdominal pain ever" or develop nausea or vomiting, please contact the office immediatly for further recommendations for treatment.  ITCHING  If you experience itching with your medications, try taking only a single pain pill, or even half a pain pill at a time.  You can also use Benadryl over the counter for itching or also to help with sleep.   TED HOSE STOCKINGS Wear the elastic stockings on both legs for three weeks following surgery during the day but you may remove then at night for sleeping.  MEDICATIONS See your medication summary on the After Visit Summary that the nursing staff will review with you prior to discharge.  You may have some home medications which will be placed on hold until you complete the course of blood thinner medication.  It is important for you to complete the blood thinner medication as prescribed by your surgeon.  Continue your approved medications as instructed at time of discharge.  PRECAUTIONS If you experience chest pain or shortness of breath - call 911 immediately for transfer to the hospital emergency department.  If you develop a fever greater that 101 F, purulent drainage from wound, increased redness or drainage from wound, foul odor from the wound/dressing, or calf pain - CONTACT YOUR SURGEON.                                                    FOLLOW-UP APPOINTMENTS Make sure you keep all of your appointments after your operation with your surgeon and caregivers. You should call the office at the above phone number and make an appointment for approximately two weeks after the date of your surgery or on the date instructed by your surgeon outlined in the "After Visit Summary".   Pick up stool softner and laxative for home use following surgery while on pain medications. Do not submerge incision under water. Please use good hand washing techniques while changing dressing each day. May shower starting three days after surgery. Please use a clean towel to pat the incision dry following showers. Continue to use ice for pain and swelling after surgery. Do not use  any lotions or creams on the incision until instructed by your surgeon.  Resume the Aspirin at home following discharge.

## 2017-05-24 NOTE — Discharge Summary (Signed)
Physician Discharge Summary   Patient ID: Andrea Ibarra MRN: [id number] DOB/AGE: 1941-02-21 76 y.o.  Admit date: 05/23/2017 Discharge date: 05/24/2017  Primary Diagnosis:  Right hip gluteal tendon tear  Admission Diagnoses:  Past Medical History:  Diagnosis Date  . Arthritis    ra and oa  . Asthma    as child  . Bursitis    right hip  . CAD (coronary artery disease)    significant with PTCA, MI in 1996  . Dyslipidemia   . Fibromyalgia   . H/O: hysterectomy   . Headache    migraines years ago  . History of echocardiogram    a. Echo 6/17: Mild concentric LVH, EF 45-50%, inferolateral HK, grade 1 diastolic dysfunction, normal RVSF, mild TR, trivial pericardial effusion  . Hypertension   . Myocardial infarction ([place])    remotely in 1996  . Pneumonia    as teenager  . PONV (postoperative nausea and vomiting)   . 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 Vertigo    Discharge Diagnoses:   Principal Problem:   Tendinopathy of right gluteus medius  Estimated body mass index is 30.24 kg/m as calculated from the following:   Height as of this encounter: 4' 11.25" (1.505 m).   Weight as of this encounter: 68.5 kg (151 lb).  Procedure(s) (LRB): Right hip bursectomy; gluteal tendon repair (Right)   Consults: None  HPI: Patient complains of right hip pain. Onset of the symptoms was several months ago. Inciting event: none. Current symptoms include lateral hip pain worse with lying on right side. Associated symptoms: none. Aggravating symptoms: pivoting, walking and lying on right side. . Evaluation to date: plain films, which were normal and MRI which shows gluteal tendon tear.  Treatment to date: physical therapy, which has been somewhat effective and injections which provided partial temporary relief.  Laboratory Data: Hospital Outpatient Visit on 05/22/2017  Component Date Value Ref Range Status  . WBC 05/22/2017 7.2  4.0 - 10.5 K/uL Final  . RBC  05/22/2017 4.29  3.87 - 5.11 MIL/uL Final  . Hemoglobin 05/22/2017 13.8  12.0 - 15.0 g/dL Final  . HCT 05/22/2017 40.1  36.0 - 46.0 % Final  . MCV 05/22/2017 93.5  78.0 - 100.0 fL Final  . MCH 05/22/2017 32.2  26.0 - 34.0 pg Final  . MCHC 05/22/2017 34.4  30.0 - 36.0 g/dL Final  . RDW 05/22/2017 13.0  11.5 - 15.5 % Final  . Platelets 05/22/2017 197  150 - 400 K/uL Final  . Sodium 05/22/2017 139  135 - 145 mmol/L Final  . Potassium 05/22/2017 4.5  3.5 - 5.1 mmol/L Final  . Chloride 05/22/2017 104  101 - 111 mmol/L Final  . CO2 05/22/2017 27  22 - 32 mmol/L Final  . Glucose, Bld 05/22/2017 107* 65 - 99 mg/dL Final  . BUN 05/22/2017 15  6 - 20 mg/dL Final  . Creatinine, Ser 05/22/2017 0.90  0.44 - 1.00 mg/dL Final  . Calcium 05/22/2017 9.5  8.9 - 10.3 mg/dL Final  . GFR calc non Af Amer 05/22/2017 >60  >60 mL/min Final  . GFR calc Af Amer 05/22/2017 >60  >60 mL/min Final   Comment: (NOTE) The eGFR has been calculated using the CKD EPI equation. This calculation has not been validated in all clinical situations. eGFR's persistently <60 mL/min signify possible Chronic Kidney Disease.   . Anion gap 05/22/2017 8  5 - 15 Final     X-Rays:No results found.  EKG: Orders placed or performed in visit on 10/09/16  . EKG 12-Lead     Hospital Course: Patient was admitted to [facility] and taken to the OR and underwent the above state procedure without complications.  Patient tolerated the procedure well and was later transferred to the recovery room and then to the orthopaedic floor for postoperative care.  They were given PO and IV analgesics for pain control following their surgery.  They were given 24 hours of postoperative antibiotics of  Anti-infectives    Start     Dose/Rate Route Frequency Ordered Stop   05/24/17 0600  vancomycin (VANCOCIN) IVPB 1000 mg/200 mL premix     1,000 mg 200 mL/hr over 60 Minutes Intravenous On call to O.R. 05/23/17 1524 05/23/17 1707     and  started on DVT prophylaxis in the form of Lovenox. Patient was encouraged to get up and ambulate the day after surgery.  The patient was allowed to be WBAT with therapy. Discharge planning was consulted to help with postop disposition and equipment needs.  Patient had a good night on the evening of surgery.  They started to get up OOB with therapy on day one.   Patient was seen in rounds and was ready to go home following morning ambulation.  Diet - Cardiac diet Follow up - in two weeks. Call office for appointment at [phone]. Activity - Weight bearing as tolerated to the surgical leg.  No active abduction of the leg, no pulling leg out to the side away from the body.  Walker for first several days until comfortable ambulating. May start showering three days following surgery but do not submerge incision under water. Continue to use ice for pain and swelling from surgery.  Baby Aspirin 81 mg daily at home Please use walker for the first couple of days until comfortable ambulating.  May start changing dressing tomorrow with dry gauze and tape.  Disposition - Home Condition Upon Discharge - Stable D/C Meds - See DC Summary DVT Prophylaxis - Aspirin - resume Aspirin at home.   Discharge Instructions    Call MD / Call 911    Complete by:  As directed    If you experience chest pain or shortness of breath, CALL 911 and be transported to the hospital emergency room.  If you develope a fever above 101 F, pus (white drainage) or increased drainage or redness at the wound, or calf pain, call your surgeon's office.   Change dressing    Complete by:  As directed    You may change your dressing dressing daily with sterile 4 x 4 inch gauze dressing and paper tape.  Do not submerge the incision under water.   Constipation Prevention    Complete by:  As directed    Drink plenty of fluids.  Prune juice may be helpful.  You may use a stool softener, such as Colace (over the counter) 100 mg twice a day.  Use  MiraLax (over the counter) for constipation as needed.   Diet - low sodium heart healthy    Complete by:  As directed    Discharge instructions    Complete by:  As directed    Resume the Aspirin at home.  Pick up stool softner and laxative for home use following surgery while on pain medications. Do not submerge incision under water. Please use good hand washing techniques while changing dressing each day. May shower starting three days after surgery. Please use a clean towel  to pat the incision dry following showers. Continue to use ice for pain and swelling after surgery. Do not use any lotions or creams on the incision until instructed by your surgeon.  Wear both TED hose on both legs during the day every day for three weeks, but may remove the TED hose at night at home.  Postoperative Constipation Protocol  Constipation - defined medically as fewer than three stools per week and severe constipation as less than one stool per week.  One of the most common issues patients have following surgery is constipation.  Even if you have a regular bowel pattern at home, your normal regimen is likely to be disrupted due to multiple reasons following surgery.  Combination of anesthesia, postoperative narcotics, change in appetite and fluid intake all can affect your bowels.  In order to avoid complications following surgery, here are some recommendations in order to help you during your recovery period.  Colace (docusate) - Pick up an over-the-counter form of Colace or another stool softener and take twice a day as long as you are requiring postoperative pain medications.  Take with a full glass of water daily.  If you experience loose stools or diarrhea, hold the colace until you stool forms back up.  If your symptoms do not get better within 1 week or if they get worse, check with your doctor.  Dulcolax (bisacodyl) - Pick up over-the-counter and take as directed by the product packaging as needed  to assist with the movement of your bowels.  Take with a full glass of water.  Use this product as needed if not relieved by Colace only.   MiraLax (polyethylene glycol) - Pick up over-the-counter to have on hand.  MiraLax is a solution that will increase the amount of water in your bowels to assist with bowel movements.  Take as directed and can mix with a glass of water, juice, soda, coffee, or tea.  Take if you go more than two days without a movement. Do not use MiraLax more than once per day. Call your doctor if you are still constipated or irregular after using this medication for 7 days in a row.  If you continue to have problems with postoperative constipation, please contact the office for further assistance and recommendations.  If you experience "the worst abdominal pain ever" or develop nausea or vomiting, please contact the office immediatly for further recommendations for treatment.   Driving restrictions    Complete by:  As directed    No driving until released by the physician.   Increase activity slowly as tolerated    Complete by:  As directed    Lifting restrictions    Complete by:  As directed    No lifting until released by the physician.   Patient may shower    Complete by:  As directed    You may shower without a dressing once there is no drainage.  Do not wash over the wound.  If drainage remains, do not shower until drainage stops.   TED hose    Complete by:  As directed    Use stockings (TED hose) for 3 weeks on both leg(s).  You may remove them at night for sleeping.   Weight bearing as tolerated    Complete by:  As directed    Laterality:  right   Extremity:  Lower     Allergies as of 05/24/2017      Reactions   Ciprofloxacin Hcl Other (See Comments)  Severe muscle pain   Codeine Nausea Only   Erythromycin Hives   Levofloxacin Nausea Only   And severe muscle pain   Metoprolol Tartrate Other (See Comments)   Hot flashes   Morphine Nausea Only   And  severe HA   Penicillins Hives   Has patient had a PCN reaction causing immediate rash, facial/tongue/throat swelling, SOB or lightheadedness with hypotension: Yes Has patient had a PCN reaction causing severe rash involving mucus membranes or skin necrosis: No Has patient had a PCN reaction that required hospitalization: No Has patient had a PCN reaction occurring within the last 10 years: No If all of the above answers are "NO", then may proceed with Cephalosporin use.   Percocet [oxycodone-acetaminophen] Nausea Only   And severe HA   Vancomycin Hives   Ceftin [cefuroxime Axetil] Hives   Doxycycline Other (See Comments)   Does not remember.    Other Other (See Comments)   Anesthesia needs anti nausea meds with this    Oxycodone Nausea Only   And severe HA   Sulfonamide Derivatives Other (See Comments)   Does not remember..      Medication List    STOP taking these medications   ibuprofen 200 MG tablet Commonly known as:  ADVIL,MOTRIN   methotrexate 2.5 MG tablet Commonly known as:  RHEUMATREX     TAKE these medications   acetaminophen 500 MG tablet Commonly known as:  TYLENOL Take 1,000 mg by mouth every 8 (eight) hours as needed (for pain.).   albuterol 108 (90 Base) MCG/ACT inhaler Commonly known as:  PROVENTIL HFA;VENTOLIN HFA Inhale 2 puffs into the lungs every 6 (six) hours as needed for wheezing or shortness of breath.   aspirin EC 81 MG tablet Take 81 mg by mouth daily.   azelastine 0.1 % nasal spray Commonly known as:  ASTELIN Place 1 spray into both nostrils 2 (two) times daily as needed (for pain.). Use in each nostril as directed   CALCIUM CITRATE + D PO Take 1 tablet by mouth daily.   fexofenadine 180 MG tablet Commonly known as:  ALLEGRA Take 180 mg by mouth daily as needed (for allergies.).   folic acid 1 MG tablet Commonly known as:  FOLVITE Take 2 mg by mouth daily.   HYDROcodone-acetaminophen 5-325 MG tablet Commonly known as:   NORCO/VICODIN Take 1-2 tablets by mouth every 4 (four) hours as needed (breakthrough pain).   losartan 25 MG tablet Commonly known as:  COZAAR TAKE 1 TABLET (25 MG TOTAL) BY MOUTH DAILY. What changed:  See the new instructions.   methocarbamol 500 MG tablet Commonly known as:  ROBAXIN Take 1 tablet (500 mg total) by mouth every 6 (six) hours as needed for muscle spasms.   rosuvastatin 10 MG tablet Commonly known as:  CRESTOR Take 10 mg by mouth at bedtime.   traMADol 50 MG tablet Commonly known as:  ULTRAM Take 1-2 tablets (50-100 mg total) by mouth every 6 (six) hours as needed for moderate pain.   traZODone 50 MG tablet Commonly known as:  DESYREL Take 25 mg by mouth at bedtime.   triamterene-hydrochlorothiazide 37.5-25 MG tablet Commonly known as:  MAXZIDE-25 Take 0.5 tablets by mouth daily as needed. For fluid retention/ankle swelling.   Vitamin D 2000 units Caps Take 2,000 Units by mouth daily.            Discharge Care Instructions        Start     Ordered   05/24/17 0000  methocarbamol (ROBAXIN) 500 MG tablet  Every 6 hours PRN    Question:  Supervising Provider  Answer:  Gaynelle Arabian   05/24/17 0819   05/24/17 0000  traMADol (ULTRAM) 50 MG tablet  Every 6 hours PRN    Question:  Supervising Provider  Answer:  Gaynelle Arabian   05/24/17 0819   05/24/17 0000  HYDROcodone-acetaminophen (NORCO/VICODIN) 5-325 MG tablet  Every 4 hours PRN    Question:  Supervising Provider  Answer:  Gaynelle Arabian   05/24/17 0819   05/24/17 0000  Call MD / Call 911    Comments:  If you experience chest pain or shortness of breath, CALL 911 and be transported to the hospital emergency room.  If you develope a fever above 101 F, pus (white drainage) or increased drainage or redness at the wound, or calf pain, call your surgeon's office.   05/24/17 0819   05/24/17 0000  Discharge instructions    Comments:  Resume the Aspirin at home.  Pick up stool softner and laxative for home  use following surgery while on pain medications. Do not submerge incision under water. Please use good hand washing techniques while changing dressing each day. May shower starting three days after surgery. Please use a clean towel to pat the incision dry following showers. Continue to use ice for pain and swelling after surgery. Do not use any lotions or creams on the incision until instructed by your surgeon.  Wear both TED hose on both legs during the day every day for three weeks, but may remove the TED hose at night at home.  Postoperative Constipation Protocol  Constipation - defined medically as fewer than three stools per week and severe constipation as less than one stool per week.  One of the most common issues patients have following surgery is constipation.  Even if you have a regular bowel pattern at home, your normal regimen is likely to be disrupted due to multiple reasons following surgery.  Combination of anesthesia, postoperative narcotics, change in appetite and fluid intake all can affect your bowels.  In order to avoid complications following surgery, here are some recommendations in order to help you during your recovery period.  Colace (docusate) - Pick up an over-the-counter form of Colace or another stool softener and take twice a day as long as you are requiring postoperative pain medications.  Take with a full glass of water daily.  If you experience loose stools or diarrhea, hold the colace until you stool forms back up.  If your symptoms do not get better within 1 week or if they get worse, check with your doctor.  Dulcolax (bisacodyl) - Pick up over-the-counter and take as directed by the product packaging as needed to assist with the movement of your bowels.  Take with a full glass of water.  Use this product as needed if not relieved by Colace only.   MiraLax (polyethylene glycol) - Pick up over-the-counter to have on hand.  MiraLax is a solution that will increase  the amount of water in your bowels to assist with bowel movements.  Take as directed and can mix with a glass of water, juice, soda, coffee, or tea.  Take if you go more than two days without a movement. Do not use MiraLax more than once per day. Call your doctor if you are still constipated or irregular after using this medication for 7 days in a row.  If you continue to have problems with postoperative constipation, please contact  the office for further assistance and recommendations.  If you experience "the worst abdominal pain ever" or develop nausea or vomiting, please contact the office immediatly for further recommendations for treatment.   05/24/17 0819   05/24/17 0000  Diet - low sodium heart healthy     05/24/17 0819   05/24/17 0000  Constipation Prevention    Comments:  Drink plenty of fluids.  Prune juice may be helpful.  You may use a stool softener, such as Colace (over the counter) 100 mg twice a day.  Use MiraLax (over the counter) for constipation as needed.   05/24/17 0819   05/24/17 0000  Increase activity slowly as tolerated     05/24/17 0819   05/24/17 0000  Patient may shower    Comments:  You may shower without a dressing once there is no drainage.  Do not wash over the wound.  If drainage remains, do not shower until drainage stops.   05/24/17 0819   05/24/17 0000  Weight bearing as tolerated    Question Answer Comment  Laterality right   Extremity Lower      05/24/17 0819   05/24/17 0000  Driving restrictions    Comments:  No driving until released by the physician.   05/24/17 0819   05/24/17 0000  Lifting restrictions    Comments:  No lifting until released by the physician.   05/24/17 0819   05/24/17 0000  Change dressing    Comments:  You may change your dressing dressing daily with sterile 4 x 4 inch gauze dressing and paper tape.  Do not submerge the incision under water.   05/24/17 0819   05/24/17 0000  TED hose    Comments:  Use stockings (TED hose) for  3 weeks on both leg(s).  You may remove them at night for sleeping.   05/24/17 6979     Follow-up Information    Gaynelle Arabian, MD. Schedule an appointment as soon as possible for a visit on 06/05/2017.   Specialty:  Orthopedic Surgery Contact information: [address] [place] [address] [phone]           Signed: Arlee Muslim, PA-C Orthopaedic Surgery 05/24/2017, 8:20 AM

## 2017-05-24 NOTE — Progress Notes (Signed)
Pt was not given vicodin that was removed earlier today. I attempted to return to pyxis but was unable to do so after pt discharged. Pharmacy rep/ [facility] collected the two vicodin and stated he would return to the pharmacy, Butler Denmark was a witness.

## 2017-05-25 DIAGNOSIS — M67853 Other specified disorders of tendon, right hip: Secondary | ICD-10-CM | POA: Diagnosis not present

## 2017-05-25 DIAGNOSIS — S76011D Strain of muscle, fascia and tendon of right hip, subsequent encounter: Secondary | ICD-10-CM | POA: Diagnosis not present

## 2017-05-25 DIAGNOSIS — M7061 Trochanteric bursitis, right hip: Secondary | ICD-10-CM | POA: Diagnosis not present

## 2017-05-25 NOTE — Anesthesia Postprocedure Evaluation (Signed)
Anesthesia Post Note  Patient: Andrea Ibarra  Procedure(Ibarra) Performed: Procedure(Ibarra) (LRB): Right hip bursectomy; gluteal tendon repair (Right)     Patient location during evaluation: PACU Anesthesia Type: General Level of consciousness: awake and alert Pain management: pain level controlled Vital Signs Assessment: post-procedure vital signs reviewed and stable Respiratory status: spontaneous breathing, nonlabored ventilation, respiratory function stable and patient connected to nasal cannula oxygen Cardiovascular status: blood pressure returned to baseline and stable Postop Assessment: no signs of nausea or vomiting Anesthetic complications: no    Last Vitals:  Vitals:   05/24/17 0200 05/24/17 1101  BP: 127/62 135/68  Pulse: 67 70  Resp: 16 18  Temp: 36.5 C 36.8 C  SpO2: 97% 99%    Last Pain:  Vitals:   05/24/17 1115  TempSrc:   PainSc: 2                  Andrea Ibarra

## 2017-05-28 NOTE — Brief Op Note (Signed)
05/23/2017  7:18 AM  PATIENT:  Andrea Ibarra  76 y.o. female  PRE-OPERATIVE DIAGNOSIS:  Right hip bursitis; gluteal tendon tear  POST-OPERATIVE DIAGNOSIS:  Right hip bursitis; gluteal tendon tear  PROCEDURE:  Procedure(s): Right hip bursectomy; gluteal tendon repair (Right)  SURGEON:  Surgeon(s) and Role:    Gaynelle Arabian, MD - Primary  PHYSICIAN ASSISTANT:   ASSISTANTS: Arlee Muslim, PA-C   ANESTHESIA:   general  EBL:  No intake/output data recorded.  BLOOD ADMINISTERED:none  DRAINS: (Medium) Hemovact drain(s) in the right hip with  Suction Open   LOCAL MEDICATIONS USED:  OTHER Exparel  COUNTS:  YES  TOURNIQUET:  * No tourniquets in log *  DICTATION: .Other Dictation: Dictation Number [id number]  PLAN OF CARE: Admit for overnight observation  PATIENT DISPOSITION:  PACU - hemodynamically stable.

## 2017-05-28 NOTE — Op Note (Signed)
NAME:  Andrea Ibarra, Andrea Ibarra NO.:  [id number]  MEDICAL RECORD NO.:  [id number]  LOCATION:  8466                         FACILITY:  [facility]  PHYSICIAN:  Gaynelle Arabian, M.D.    DATE OF BIRTH:  1941-02-21  DATE OF PROCEDURE:  05/23/2017 DATE OF DISCHARGE:                              OPERATIVE REPORT   PREOPERATIVE DIAGNOSIS:  Right hip bursitis with gluteal tendon tear.  POSTOPERATIVE DIAGNOSIS:  Right hip bursitis with gluteal tendon tear.  PROCEDURE:  Right hip bursectomy with gluteal tendon repair.  SURGEON:  Gaynelle Arabian, M.D.  ASSISTANT:  Alexzandrew L. Perkins, P.A.C.  ANESTHESIA:  General.  ESTIMATED BLOOD LOSS:  Minimal.  DRAINS:  Hemovac x1.  COMPLICATIONS:  None.  CONDITION:  Stable to recovery.  BRIEF CLINICAL NOTE:  [name] is a 76 year old female who has a long history of right lateral hip pain.  She has had physical therapy and injections and nothing is helping anymore.  MRI showed a gluteal tendon tear with bursitis.  She presents now for bursectomy and gluteal tendon repair.  PROCEDURE IN DETAIL:  After successful administration of general anesthetic, the patient was placed in left lateral decubitus position with the right side up and held with a hip positioner.  Right lower extremity was isolated from perineum with plastic drapes and prepped and draped in the usual sterile fashion.  About a 4 inch lateral incision was made centered at the greater trochanter.  The skin was cut with a 10 blade through the subcutaneous tissue to the level of the fascia lata which was incised in line with the skin incision.  Bursa was identified and excised.  There was a thickened bursa with some fluid in it.  She did not have a full-thickness gluteus medius tear, but there was a palpable defect present.  I made a vertical incision in the tendon and we found the area that was degenerative and torn from the greater trochanter.  I was able to palpate on the  tendon and palpate the gluteus minimus, which was normal.  A Mitek suture anchor is then placed into the greater trochanter and the sutures are passed through the edge of the tendon.  The tendon was then advanced down onto the bone and subsequently oversewn.  This was a very stable repair.  We then copiously irrigated with saline solution.  The gluteal muscles, the fascia lata, and subcu tissue were then injected with 20 mL of Exparel mixed with 40 mL of normal saline.  Fascia lata was then closed with a running #1 Vicryl suture.  A small triangle of tissue was left open over the tip of the greater trochanter to prevent friction.  Subcu was then closed with interrupted 2-0 Vicryl and subcuticular running 4-0 Monocryl.  Incision was cleaned and dried and Steri-Strips and a bulky sterile dressing applied.  She was then awakened and transported to recovery in stable condition.     Gaynelle Arabian, M.D.     FA/MEDQ  D:  05/28/2017  T:  05/28/2017  Job:  [id number]

## 2017-06-14 DIAGNOSIS — Z8709 Personal history of other diseases of the respiratory system: Secondary | ICD-10-CM | POA: Diagnosis not present

## 2017-06-14 DIAGNOSIS — I252 Old myocardial infarction: Secondary | ICD-10-CM | POA: Diagnosis not present

## 2017-07-02 DIAGNOSIS — E669 Obesity, unspecified: Secondary | ICD-10-CM | POA: Diagnosis not present

## 2017-07-02 DIAGNOSIS — M5136 Other intervertebral disc degeneration, lumbar region: Secondary | ICD-10-CM | POA: Diagnosis not present

## 2017-07-02 DIAGNOSIS — Z6831 Body mass index (BMI) 31.0-31.9, adult: Secondary | ICD-10-CM | POA: Diagnosis not present

## 2017-07-02 DIAGNOSIS — R600 Localized edema: Secondary | ICD-10-CM | POA: Diagnosis not present

## 2017-07-02 DIAGNOSIS — M150 Primary generalized (osteo)arthritis: Secondary | ICD-10-CM | POA: Diagnosis not present

## 2017-07-02 DIAGNOSIS — M25551 Pain in right hip: Secondary | ICD-10-CM | POA: Diagnosis not present

## 2017-07-02 DIAGNOSIS — M0589 Other rheumatoid arthritis with rheumatoid factor of multiple sites: Secondary | ICD-10-CM | POA: Diagnosis not present

## 2017-07-02 DIAGNOSIS — G4709 Other insomnia: Secondary | ICD-10-CM | POA: Diagnosis not present

## 2017-07-04 DIAGNOSIS — R05 Cough: Secondary | ICD-10-CM | POA: Diagnosis not present

## 2017-07-04 DIAGNOSIS — J4521 Mild intermittent asthma with (acute) exacerbation: Secondary | ICD-10-CM | POA: Diagnosis not present

## 2017-07-20 DIAGNOSIS — S76011A Strain of muscle, fascia and tendon of right hip, initial encounter: Secondary | ICD-10-CM | POA: Diagnosis not present

## 2017-07-24 DIAGNOSIS — Z23 Encounter for immunization: Secondary | ICD-10-CM | POA: Diagnosis not present

## 2017-07-25 DIAGNOSIS — S76011A Strain of muscle, fascia and tendon of right hip, initial encounter: Secondary | ICD-10-CM | POA: Diagnosis not present

## 2017-07-28 DIAGNOSIS — S76011A Strain of muscle, fascia and tendon of right hip, initial encounter: Secondary | ICD-10-CM | POA: Diagnosis not present

## 2017-07-31 DIAGNOSIS — S76011A Strain of muscle, fascia and tendon of right hip, initial encounter: Secondary | ICD-10-CM | POA: Diagnosis not present

## 2017-08-02 DIAGNOSIS — S76011A Strain of muscle, fascia and tendon of right hip, initial encounter: Secondary | ICD-10-CM | POA: Diagnosis not present

## 2017-08-06 DIAGNOSIS — S76011A Strain of muscle, fascia and tendon of right hip, initial encounter: Secondary | ICD-10-CM | POA: Diagnosis not present

## 2017-08-14 DIAGNOSIS — S76011A Strain of muscle, fascia and tendon of right hip, initial encounter: Secondary | ICD-10-CM | POA: Diagnosis not present

## 2017-08-15 DIAGNOSIS — J0110 Acute frontal sinusitis, unspecified: Secondary | ICD-10-CM | POA: Diagnosis not present

## 2017-08-17 DIAGNOSIS — S76011A Strain of muscle, fascia and tendon of right hip, initial encounter: Secondary | ICD-10-CM | POA: Diagnosis not present

## 2017-08-23 DIAGNOSIS — S76011A Strain of muscle, fascia and tendon of right hip, initial encounter: Secondary | ICD-10-CM | POA: Diagnosis not present

## 2017-08-31 DIAGNOSIS — S76011A Strain of muscle, fascia and tendon of right hip, initial encounter: Secondary | ICD-10-CM | POA: Diagnosis not present

## 2017-09-03 DIAGNOSIS — S76011A Strain of muscle, fascia and tendon of right hip, initial encounter: Secondary | ICD-10-CM | POA: Diagnosis not present

## 2017-09-06 DIAGNOSIS — M1611 Unilateral primary osteoarthritis, right hip: Secondary | ICD-10-CM | POA: Diagnosis not present

## 2017-09-07 DIAGNOSIS — S76011A Strain of muscle, fascia and tendon of right hip, initial encounter: Secondary | ICD-10-CM | POA: Diagnosis not present

## 2017-09-15 DIAGNOSIS — S76011A Strain of muscle, fascia and tendon of right hip, initial encounter: Secondary | ICD-10-CM | POA: Diagnosis not present

## 2017-09-25 DIAGNOSIS — S76011A Strain of muscle, fascia and tendon of right hip, initial encounter: Secondary | ICD-10-CM | POA: Diagnosis not present

## 2017-09-26 DIAGNOSIS — M25551 Pain in right hip: Secondary | ICD-10-CM | POA: Diagnosis not present

## 2017-09-26 DIAGNOSIS — Z4789 Encounter for other orthopedic aftercare: Secondary | ICD-10-CM | POA: Diagnosis not present

## 2017-10-03 DIAGNOSIS — E669 Obesity, unspecified: Secondary | ICD-10-CM | POA: Diagnosis not present

## 2017-10-03 DIAGNOSIS — Z6830 Body mass index (BMI) 30.0-30.9, adult: Secondary | ICD-10-CM | POA: Diagnosis not present

## 2017-10-03 DIAGNOSIS — R600 Localized edema: Secondary | ICD-10-CM | POA: Diagnosis not present

## 2017-10-03 DIAGNOSIS — G4709 Other insomnia: Secondary | ICD-10-CM | POA: Diagnosis not present

## 2017-10-03 DIAGNOSIS — M25551 Pain in right hip: Secondary | ICD-10-CM | POA: Diagnosis not present

## 2017-10-03 DIAGNOSIS — M5136 Other intervertebral disc degeneration, lumbar region: Secondary | ICD-10-CM | POA: Diagnosis not present

## 2017-10-03 DIAGNOSIS — M0589 Other rheumatoid arthritis with rheumatoid factor of multiple sites: Secondary | ICD-10-CM | POA: Diagnosis not present

## 2017-10-03 DIAGNOSIS — M150 Primary generalized (osteo)arthritis: Secondary | ICD-10-CM | POA: Diagnosis not present

## 2017-11-16 DIAGNOSIS — Z7982 Long term (current) use of aspirin: Secondary | ICD-10-CM | POA: Diagnosis not present

## 2017-11-16 DIAGNOSIS — M8580 Other specified disorders of bone density and structure, unspecified site: Secondary | ICD-10-CM | POA: Diagnosis not present

## 2017-11-16 DIAGNOSIS — E7800 Pure hypercholesterolemia, unspecified: Secondary | ICD-10-CM | POA: Diagnosis not present

## 2017-11-21 ENCOUNTER — Encounter: Payer: Self-pay | Admitting: Cardiovascular Disease

## 2017-11-21 ENCOUNTER — Ambulatory Visit: Payer: PPO | Admitting: Cardiovascular Disease

## 2017-11-21 VITALS — BP 116/58 | HR 74 | Ht 59.5 in | Wt 147.0 lb

## 2017-11-21 DIAGNOSIS — I255 Ischemic cardiomyopathy: Secondary | ICD-10-CM | POA: Diagnosis not present

## 2017-11-21 DIAGNOSIS — E7800 Pure hypercholesterolemia, unspecified: Secondary | ICD-10-CM

## 2017-11-21 DIAGNOSIS — I2510 Atherosclerotic heart disease of native coronary artery without angina pectoris: Secondary | ICD-10-CM
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
## 2017-11-21 NOTE — Patient Instructions (Signed)

## 2017-11-22 DIAGNOSIS — M069 Rheumatoid arthritis, unspecified: Secondary | ICD-10-CM | POA: Diagnosis not present

## 2017-11-22 DIAGNOSIS — I10 Essential (primary) hypertension: Secondary | ICD-10-CM | POA: Diagnosis not present

## 2017-11-22 DIAGNOSIS — M545 Low back pain: Secondary | ICD-10-CM | POA: Diagnosis not present

## 2017-11-22 DIAGNOSIS — J309 Allergic rhinitis, unspecified: Secondary | ICD-10-CM | POA: Diagnosis not present

## 2017-11-22 DIAGNOSIS — M25473 Effusion, unspecified ankle: Secondary | ICD-10-CM | POA: Diagnosis not present

## 2017-11-22 DIAGNOSIS — G43909 Migraine, unspecified, not intractable, without status migrainosus: Secondary | ICD-10-CM | POA: Diagnosis not present

## 2017-11-22 DIAGNOSIS — I2510 Atherosclerotic heart disease of native coronary artery without angina pectoris: Secondary | ICD-10-CM | POA: Diagnosis not present

## 2017-11-22 DIAGNOSIS — Z0000 Encounter for general adult medical examination without abnormal findings: Secondary | ICD-10-CM | POA: Diagnosis not present

## 2017-11-22 DIAGNOSIS — I252 Old myocardial infarction: Secondary | ICD-10-CM | POA: Diagnosis not present

## 2017-11-22 DIAGNOSIS — M159 Polyosteoarthritis, unspecified: Secondary | ICD-10-CM | POA: Diagnosis not present

## 2017-11-22 DIAGNOSIS — E7800 Pure hypercholesterolemia, unspecified: Secondary | ICD-10-CM | POA: Diagnosis not present

## 2017-11-22 DIAGNOSIS — M4310 Spondylolisthesis, site unspecified: Secondary | ICD-10-CM | POA: Diagnosis not present

## 2017-12-10 DIAGNOSIS — J300 Vasomotor rhinitis: Secondary | ICD-10-CM | POA: Diagnosis not present

## 2017-12-10 DIAGNOSIS — J3089 Other allergic rhinitis: Secondary | ICD-10-CM | POA: Diagnosis not present

## 2017-12-10 DIAGNOSIS — R05 Cough: Secondary | ICD-10-CM | POA: Diagnosis not present

## 2017-12-19 DIAGNOSIS — G894 Chronic pain syndrome: Secondary | ICD-10-CM | POA: Diagnosis not present

## 2017-12-19 DIAGNOSIS — M4316 Spondylolisthesis, lumbar region: Secondary | ICD-10-CM | POA: Diagnosis not present

## 2017-12-19 DIAGNOSIS — M5136 Other intervertebral disc degeneration, lumbar region: Secondary | ICD-10-CM | POA: Diagnosis not present

## 2017-12-25 DIAGNOSIS — M5136 Other intervertebral disc degeneration, lumbar region: Secondary | ICD-10-CM | POA: Diagnosis not present

## 2017-12-25 DIAGNOSIS — M5416 Radiculopathy, lumbar region: Secondary | ICD-10-CM | POA: Diagnosis not present

## 2018-01-01 DIAGNOSIS — M150 Primary generalized (osteo)arthritis: Secondary | ICD-10-CM | POA: Diagnosis not present

## 2018-01-01 DIAGNOSIS — Z6830 Body mass index (BMI) 30.0-30.9, adult: Secondary | ICD-10-CM | POA: Diagnosis not present

## 2018-01-01 DIAGNOSIS — E669 Obesity, unspecified: Secondary | ICD-10-CM | POA: Diagnosis not present

## 2018-01-01 DIAGNOSIS — M25551 Pain in right hip: Secondary | ICD-10-CM | POA: Diagnosis not present

## 2018-01-01 DIAGNOSIS — G4709 Other insomnia: Secondary | ICD-10-CM | POA: Diagnosis not present

## 2018-01-01 DIAGNOSIS — M5136 Other intervertebral disc degeneration, lumbar region: Secondary | ICD-10-CM | POA: Diagnosis not present

## 2018-01-01 DIAGNOSIS — R600 Localized edema: Secondary | ICD-10-CM | POA: Diagnosis not present

## 2018-01-01 DIAGNOSIS — M0589 Other rheumatoid arthritis with rheumatoid factor of multiple sites: Secondary | ICD-10-CM | POA: Diagnosis not present

## 2018-01-02 DIAGNOSIS — M25551 Pain in right hip: Secondary | ICD-10-CM | POA: Diagnosis not present

## 2018-01-02 DIAGNOSIS — S8001XA Contusion of right knee, initial encounter: Secondary | ICD-10-CM | POA: Diagnosis not present

## 2018-01-08 DIAGNOSIS — M67863 Other specified disorders of tendon, right knee: Secondary | ICD-10-CM | POA: Diagnosis not present

## 2018-01-14 DIAGNOSIS — M67863 Other specified disorders of tendon, right knee: Secondary | ICD-10-CM | POA: Diagnosis not present

## 2018-01-17 DIAGNOSIS — M67863 Other specified disorders of tendon, right knee: Secondary | ICD-10-CM | POA: Diagnosis not present

## 2018-01-22 DIAGNOSIS — M67863 Other specified disorders of tendon, right knee: Secondary | ICD-10-CM | POA: Diagnosis not present

## 2018-01-23 DIAGNOSIS — M21962 Unspecified acquired deformity of left lower leg: Secondary | ICD-10-CM | POA: Diagnosis not present

## 2018-01-23 DIAGNOSIS — M21961 Unspecified acquired deformity of right lower leg: Secondary | ICD-10-CM | POA: Diagnosis not present

## 2018-01-25 DIAGNOSIS — M67863 Other specified disorders of tendon, right knee: Secondary | ICD-10-CM | POA: Diagnosis not present

## 2018-01-29 DIAGNOSIS — M67863 Other specified disorders of tendon, right knee: Secondary | ICD-10-CM | POA: Diagnosis not present

## 2018-02-03 ENCOUNTER — Other Ambulatory Visit: Payer: Self-pay | Admitting: Cardiovascular Disease

## 2018-02-05 DIAGNOSIS — M67863 Other specified disorders of tendon, right knee: Secondary | ICD-10-CM | POA: Diagnosis not present

## 2018-02-07 DIAGNOSIS — M67863 Other specified disorders of tendon, right knee: Secondary | ICD-10-CM | POA: Diagnosis not present

## 2018-02-08 DIAGNOSIS — Z1231 Encounter for screening mammogram for malignant neoplasm of breast: Secondary | ICD-10-CM | POA: Diagnosis not present

## 2018-02-08 DIAGNOSIS — Z6830 Body mass index (BMI) 30.0-30.9, adult: Secondary | ICD-10-CM | POA: Diagnosis not present

## 2018-02-08 DIAGNOSIS — Z01419 Encounter for gynecological examination (general) (routine) without abnormal findings: Secondary | ICD-10-CM | POA: Diagnosis not present

## 2018-02-15 DIAGNOSIS — M1711 Unilateral primary osteoarthritis, right knee: Secondary | ICD-10-CM | POA: Diagnosis not present

## 2018-03-13 DIAGNOSIS — M150 Primary generalized (osteo)arthritis: Secondary | ICD-10-CM | POA: Diagnosis not present

## 2018-03-13 DIAGNOSIS — G4709 Other insomnia: Secondary | ICD-10-CM | POA: Diagnosis not present

## 2018-03-13 DIAGNOSIS — M25551 Pain in right hip: Secondary | ICD-10-CM | POA: Diagnosis not present

## 2018-03-13 DIAGNOSIS — R600 Localized edema: Secondary | ICD-10-CM | POA: Diagnosis not present

## 2018-03-13 DIAGNOSIS — M5136 Other intervertebral disc degeneration, lumbar region: Secondary | ICD-10-CM | POA: Diagnosis not present

## 2018-03-13 DIAGNOSIS — M0589 Other rheumatoid arthritis with rheumatoid factor of multiple sites: Secondary | ICD-10-CM | POA: Diagnosis not present

## 2018-03-13 DIAGNOSIS — Z6830 Body mass index (BMI) 30.0-30.9, adult: Secondary | ICD-10-CM | POA: Diagnosis not present

## 2018-03-13 DIAGNOSIS — E669 Obesity, unspecified: Secondary | ICD-10-CM | POA: Diagnosis not present

## 2018-04-11 DIAGNOSIS — M79672 Pain in left foot: Secondary | ICD-10-CM | POA: Diagnosis not present

## 2018-04-11 DIAGNOSIS — M21961 Unspecified acquired deformity of right lower leg: Secondary | ICD-10-CM | POA: Diagnosis not present

## 2018-04-11 DIAGNOSIS — M21962 Unspecified acquired deformity of left lower leg: Secondary | ICD-10-CM | POA: Diagnosis not present

## 2018-04-11 DIAGNOSIS — M79671 Pain in right foot: Secondary | ICD-10-CM | POA: Diagnosis not present

## 2018-06-03 DIAGNOSIS — M0589 Other rheumatoid arthritis with rheumatoid factor of multiple sites: Secondary | ICD-10-CM | POA: Diagnosis not present

## 2018-06-03 DIAGNOSIS — E669 Obesity, unspecified: Secondary | ICD-10-CM | POA: Diagnosis not present

## 2018-06-03 DIAGNOSIS — M150 Primary generalized (osteo)arthritis: Secondary | ICD-10-CM | POA: Diagnosis not present

## 2018-06-03 DIAGNOSIS — R600 Localized edema: Secondary | ICD-10-CM | POA: Diagnosis not present

## 2018-06-03 DIAGNOSIS — M25551 Pain in right hip: Secondary | ICD-10-CM | POA: Diagnosis not present

## 2018-06-03 DIAGNOSIS — G4709 Other insomnia: Secondary | ICD-10-CM | POA: Diagnosis not present

## 2018-06-03 DIAGNOSIS — Z6830 Body mass index (BMI) 30.0-30.9, adult: Secondary | ICD-10-CM | POA: Diagnosis not present

## 2018-06-03 DIAGNOSIS — M5136 Other intervertebral disc degeneration, lumbar region: Secondary | ICD-10-CM | POA: Diagnosis not present

## 2018-06-12 DIAGNOSIS — M5136 Other intervertebral disc degeneration, lumbar region: Secondary | ICD-10-CM | POA: Diagnosis not present

## 2018-06-12 DIAGNOSIS — M50322 Other cervical disc degeneration at C5-C6 level: Secondary | ICD-10-CM | POA: Diagnosis not present

## 2018-06-18 DIAGNOSIS — Z23 Encounter for immunization: Secondary | ICD-10-CM | POA: Diagnosis not present

## 2018-06-19 DIAGNOSIS — M50322 Other cervical disc degeneration at C5-C6 level: Secondary | ICD-10-CM | POA: Diagnosis not present

## 2018-06-19 DIAGNOSIS — M5136 Other intervertebral disc degeneration, lumbar region: Secondary | ICD-10-CM | POA: Diagnosis not present

## 2018-06-24 DIAGNOSIS — M5136 Other intervertebral disc degeneration, lumbar region: Secondary | ICD-10-CM | POA: Diagnosis not present

## 2018-06-24 DIAGNOSIS — M50322 Other cervical disc degeneration at C5-C6 level: Secondary | ICD-10-CM | POA: Diagnosis not present

## 2018-06-28 DIAGNOSIS — M5136 Other intervertebral disc degeneration, lumbar region: Secondary | ICD-10-CM | POA: Diagnosis not present

## 2018-06-28 DIAGNOSIS — M50322 Other cervical disc degeneration at C5-C6 level: Secondary | ICD-10-CM | POA: Diagnosis not present

## 2018-07-01 DIAGNOSIS — M5136 Other intervertebral disc degeneration, lumbar region: Secondary | ICD-10-CM | POA: Diagnosis not present

## 2018-07-01 DIAGNOSIS — M50322 Other cervical disc degeneration at C5-C6 level: Secondary | ICD-10-CM | POA: Diagnosis not present

## 2018-07-05 DIAGNOSIS — M50322 Other cervical disc degeneration at C5-C6 level: Secondary | ICD-10-CM | POA: Diagnosis not present

## 2018-07-05 DIAGNOSIS — M5136 Other intervertebral disc degeneration, lumbar region: Secondary | ICD-10-CM | POA: Diagnosis not present

## 2018-07-08 DIAGNOSIS — M5136 Other intervertebral disc degeneration, lumbar region: Secondary | ICD-10-CM | POA: Diagnosis not present

## 2018-07-08 DIAGNOSIS — M50322 Other cervical disc degeneration at C5-C6 level: Secondary | ICD-10-CM | POA: Diagnosis not present

## 2018-07-10 DIAGNOSIS — D2271 Melanocytic nevi of right lower limb, including hip: Secondary | ICD-10-CM | POA: Diagnosis not present

## 2018-07-12 DIAGNOSIS — M50322 Other cervical disc degeneration at C5-C6 level: Secondary | ICD-10-CM | POA: Diagnosis not present

## 2018-07-12 DIAGNOSIS — M5136 Other intervertebral disc degeneration, lumbar region: Secondary | ICD-10-CM | POA: Diagnosis not present

## 2018-07-15 DIAGNOSIS — M50322 Other cervical disc degeneration at C5-C6 level: Secondary | ICD-10-CM | POA: Diagnosis not present

## 2018-07-15 DIAGNOSIS — M5136 Other intervertebral disc degeneration, lumbar region: Secondary | ICD-10-CM | POA: Diagnosis not present

## 2018-07-27 DIAGNOSIS — M50322 Other cervical disc degeneration at C5-C6 level: Secondary | ICD-10-CM | POA: Diagnosis not present

## 2018-07-27 DIAGNOSIS — M5136 Other intervertebral disc degeneration, lumbar region: Secondary | ICD-10-CM | POA: Diagnosis not present

## 2018-08-02 DIAGNOSIS — M50322 Other cervical disc degeneration at C5-C6 level: Secondary | ICD-10-CM | POA: Diagnosis not present

## 2018-08-02 DIAGNOSIS — M5136 Other intervertebral disc degeneration, lumbar region: Secondary | ICD-10-CM | POA: Diagnosis not present

## 2018-08-26 DIAGNOSIS — Z6830 Body mass index (BMI) 30.0-30.9, adult: Secondary | ICD-10-CM | POA: Diagnosis not present

## 2018-08-26 DIAGNOSIS — G4709 Other insomnia: Secondary | ICD-10-CM | POA: Diagnosis not present

## 2018-08-26 DIAGNOSIS — M0589 Other rheumatoid arthritis with rheumatoid factor of multiple sites: Secondary | ICD-10-CM | POA: Diagnosis not present

## 2018-08-26 DIAGNOSIS — R600 Localized edema: Secondary | ICD-10-CM | POA: Diagnosis not present

## 2018-08-26 DIAGNOSIS — M5136 Other intervertebral disc degeneration, lumbar region: Secondary | ICD-10-CM | POA: Diagnosis not present

## 2018-08-26 DIAGNOSIS — M25551 Pain in right hip: Secondary | ICD-10-CM | POA: Diagnosis not present

## 2018-08-26 DIAGNOSIS — M150 Primary generalized (osteo)arthritis: Secondary | ICD-10-CM | POA: Diagnosis not present

## 2018-08-26 DIAGNOSIS — E669 Obesity, unspecified: Secondary | ICD-10-CM | POA: Diagnosis not present

## 2018-09-24 ENCOUNTER — Other Ambulatory Visit: Payer: Self-pay | Admitting: Cardiovascular Disease

## 2018-10-10 DIAGNOSIS — M5416 Radiculopathy, lumbar region: Secondary | ICD-10-CM | POA: Diagnosis not present

## 2018-10-31 DIAGNOSIS — N644 Mastodynia: Secondary | ICD-10-CM | POA: Diagnosis not present

## 2018-11-01 DIAGNOSIS — M5136 Other intervertebral disc degeneration, lumbar region: Secondary | ICD-10-CM | POA: Diagnosis not present

## 2018-11-01 DIAGNOSIS — M47816 Spondylosis without myelopathy or radiculopathy, lumbar region: Secondary | ICD-10-CM | POA: Diagnosis not present

## 2018-11-01 DIAGNOSIS — G894 Chronic pain syndrome: Secondary | ICD-10-CM | POA: Diagnosis not present

## 2018-11-01 DIAGNOSIS — M5416 Radiculopathy, lumbar region: Secondary | ICD-10-CM | POA: Diagnosis not present

## 2018-11-12 ENCOUNTER — Ambulatory Visit
Admission: RE | Admit: 2018-11-12 | Discharge: 2018-11-12 | Disposition: A | Discharge status: Home / Self Care | Payer: PPO | Source: Ambulatory Visit | Attending: Obstetrics and Gynecology | Admitting: Obstetrics and Gynecology

## 2018-11-12 ENCOUNTER — Other Ambulatory Visit: Payer: Self-pay | Admitting: Obstetrics and Gynecology

## 2018-11-12 DIAGNOSIS — N644 Mastodynia: Secondary | ICD-10-CM

## 2018-11-12 DIAGNOSIS — R0789 Other chest pain: Secondary | ICD-10-CM | POA: Diagnosis not present

## 2018-11-23 DIAGNOSIS — M47816 Spondylosis without myelopathy or radiculopathy, lumbar region: Secondary | ICD-10-CM | POA: Diagnosis not present

## 2018-11-23 DIAGNOSIS — M5416 Radiculopathy, lumbar region: Secondary | ICD-10-CM | POA: Diagnosis not present

## 2019-01-28 ENCOUNTER — Telehealth: Payer: Self-pay | Admitting: Cardiovascular Disease

## 2019-01-28 NOTE — Telephone Encounter (Signed)
New message   Patient states she received my chart message but can't access them and would like a nurse to call her about appt for 01/31/19.

## 2019-01-29 NOTE — Telephone Encounter (Signed)
Patient set up for MyChart? Yes    Is patient using Smartphone/computer/tablet?pt has one , but the batteries dies real quick   Did audio/video work? Yes , but within 3 minutes her phone was dead and cut off   Does patient need telephone visit?yes  Best phone number to use? [phone]  Special Instructions?  Patient will have vitals   Patient can not access her mychart phone will not stay charged      Virtual Visit Pre-Appointment Phone Call  "(Name), I am calling you today to discuss your upcoming appointment. We are currently trying to limit exposure to the virus that causes COVID-19 by seeing patients at home rather than in the office."  1. "What is the BEST phone number to call the day of the visit?" - include this in appointment notes  2. Do you have or have access to (through a family member/friend) a smartphone with video capability that we can use for your visit?" a. If yes - list this number in appt notes as cell (if different from BEST phone #) and list the appointment type as a VIDEO visit in appointment notes b. If no - list the appointment type as a PHONE visit in appointment notes  3. Confirm consent - "In the setting of the current Covid19 crisis, you are scheduled for a (phone or video) visit with your provider on (date) at (time).  Just as we do with many in-office visits, in order for you to participate in this visit, we must obtain consent.  If you'd like, I can send this to your mychart (if signed up) or email for you to review.  Otherwise, I can obtain your verbal consent now.  All virtual visits are billed to your insurance company just like a normal visit would be.  By agreeing to a virtual visit, we'd like you to understand that the technology does not allow for your provider to perform an examination, and thus may limit your provider's ability to fully assess your condition. If your provider identifies any concerns that need to be evaluated in person, we will  make arrangements to do so.  Finally, though the technology is pretty good, we cannot assure that it will always work on either your or our end, and in the setting of a video visit, we may have to convert it to a phone-only visit.  In either situation, we cannot ensure that we have a secure connection.  Are you willing to proceed?" STAFF: Did the patient verbally acknowledge consent to telehealth visit? Document YES/NO here:  yes  4. Advise patient to be prepared - "Two hours prior to your appointment, go ahead and check your blood pressure, pulse, oxygen saturation, and your weight (if you have the equipment to check those) and write them all down. When your visit starts, your provider will ask you for this information. If you have an Apple Watch or Kardia device, please plan to have heart rate information ready on the day of your appointment. Please have a pen and paper handy nearby the day of the visit as well."  5. Give patient instructions for MyChart download to smartphone OR Doximity/Doxy.me as below if video visit (depending on what platform provider is using)  6. Inform patient they will receive a phone call 15 minutes prior to their appointment time (may be from unknown caller ID) so they should be prepared to answer    TELEPHONE CALL NOTE  Andrea Ibarra has been deemed a candidate for  a follow-up tele-health visit to limit community exposure during the Covid-19 pandemic. I spoke with the patient via phone to ensure availability of phone/video source, confirm preferred email & phone number, and discuss instructions and expectations.  I reminded Andrea Ibarra to be prepared with any vital sign and/or heart rhythm information that could potentially be obtained via home monitoring, at the time of her visit. I reminded Andrea Ibarra to expect a phone call prior to her visit.  Andrea Ibarra 01/29/2019 12:33 PM   INSTRUCTIONS FOR DOWNLOADING THE MYCHART APP TO SMARTPHONE  - The patient must  first make sure to have activated MyChart and know their login information - If Apple, go to [employer] and type in MyChart in the search bar and download the app. If Android, ask patient to go to [employer] and type in [place] in the search bar and download the app. The app is free but as with any other app downloads, their phone may require them to verify saved payment information or Apple/Android password.  - The patient will need to then log into the app with their MyChart username and password, and select [facility] as their healthcare provider to link the account. When it is time for your visit, go to the MyChart app, find appointments, and click Begin Video Visit. Be sure to Select Allow for your device to access the Microphone and Camera for your visit. You will then be connected, and your provider will be with you shortly.  **If they have any issues connecting, or need assistance please contact MyChart service desk [phone])**  **If using a computer, in order to ensure the best quality for their visit they will need to use either of the following Internet Browsers: [employer], or Google Chrome**  IF USING DOXIMITY or DOXY.ME - The patient will receive a link just prior to their visit by text.     FULL LENGTH CONSENT FOR TELE-HEALTH VISIT   [name], [name] and its employed or contracted physicians, physician assistants, nurse practitioners or other licensed health care professionals (the Practitioner), to provide me with telemedicine health care services (the Services") as deemed necessary by the treating Practitioner. I acknowledge and consent to receive the Services by the Practitioner via telemedicine. I understand that the telemedicine visit will involve communicating with the Practitioner through live audiovisual communication technology and the disclosure of certain medical information by electronic  transmission. I acknowledge that I have been given the opportunity to request an in-person assessment or other available alternative prior to the telemedicine visit and am voluntarily participating in the telemedicine visit.  I understand that I have the right to withhold or withdraw my consent to the use of telemedicine in the course of my care at any time, without affecting my right to future care or treatment, and that the Practitioner or I may terminate the telemedicine visit at any time. I understand that I have the right to inspect all information obtained and/or recorded in the course of the telemedicine visit and may receive copies of available information for a reasonable fee.  I understand that some of the potential risks of receiving the Services via telemedicine include:   Delay or interruption in medical evaluation due to technological equipment failure or disruption;  Information transmitted may not be sufficient (e.g. poor resolution of images) to allow for appropriate medical decision making by the Practitioner; and/or   In rare instances, security protocols  could fail, causing a breach of personal health information.  Furthermore, I acknowledge that it is my responsibility to provide information about my medical history, conditions and care that is complete and accurate to the best of my ability. I acknowledge that Practitioner's advice, recommendations, and/or decision may be based on factors not within their control, such as incomplete or inaccurate data provided by me or distortions of diagnostic images or specimens that may result from electronic transmissions. I understand that the practice of medicine is not an exact science and that Practitioner makes no warranties or guarantees regarding treatment outcomes. I acknowledge that I will receive a copy of this consent concurrently upon execution via email to the email address I last provided but may also request a printed copy by  calling the office of [facility].    I understand that my insurance will be billed for this visit.   I have read or had this consent read to me.  I understand the contents of this consent, which adequately explains the benefits and risks of the Services being provided via telemedicine.   I have been provided ample opportunity to ask questions regarding this consent and the Services and have had my questions answered to my satisfaction.  I give my informed consent for the services to be provided through the use of telemedicine in my medical care  By participating in this telemedicine visit I agree to the above.

## 2019-01-31 ENCOUNTER — Telehealth (INDEPENDENT_AMBULATORY_CARE_PROVIDER_SITE_OTHER): Payer: PPO | Admitting: Cardiovascular Disease

## 2019-01-31 ENCOUNTER — Encounter

## 2019-01-31 ENCOUNTER — Other Ambulatory Visit: Payer: Self-pay

## 2019-01-31 ENCOUNTER — Encounter: Payer: Self-pay | Admitting: Cardiovascular Disease

## 2019-01-31 VITALS — Ht 59.5 in | Wt 145.0 lb

## 2019-01-31 DIAGNOSIS — I2510 Atherosclerotic heart disease of native coronary artery without angina pectoris: Secondary | ICD-10-CM

## 2019-01-31 DIAGNOSIS — E782 Mixed hyperlipidemia: Secondary | ICD-10-CM

## 2019-01-31 NOTE — Patient Instructions (Signed)
Medication Instructions:  Your provider recommends that you continue on your current medications as directed. Please refer to the Current Medication list given to you today.   If you need a refill on your cardiac medications before your next appointment, please call your pharmacy.    Follow-Up: At [facility], you and your health needs are our priority.  As part of our continuing mission to provide you with exceptional heart care, we have created designated Provider Care Teams.  These Care Teams include your primary Cardiologist (physician) and Advanced Practice Providers (APPs -  Physician Assistants and Nurse Practitioners) who all work together to provide you with the care you need, when you need it. You will need a follow up appointment in:  12 months.  Please call our office 2 months in advance to schedule this appointment.  You may see Dr. Sherren Mocha or one of the following Advanced Practice Providers on your designated Care Team: Richardson Dopp, PA-C [place], [place] . Daune Perch, NP
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
## 2019-02-04 ENCOUNTER — Ambulatory Visit (INDEPENDENT_AMBULATORY_CARE_PROVIDER_SITE_OTHER): Payer: PPO | Admitting: Psychiatry

## 2019-02-04 ENCOUNTER — Other Ambulatory Visit: Payer: Self-pay

## 2019-02-04 DIAGNOSIS — F4323 Adjustment disorder with mixed anxiety and depressed mood: Secondary | ICD-10-CM | POA: Diagnosis not present
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
## 2019-02-11 DIAGNOSIS — Z6830 Body mass index (BMI) 30.0-30.9, adult: Secondary | ICD-10-CM | POA: Diagnosis not present

## 2019-02-11 DIAGNOSIS — N952 Postmenopausal atrophic vaginitis: Secondary | ICD-10-CM | POA: Diagnosis not present

## 2019-02-11 DIAGNOSIS — Z1231 Encounter for screening mammogram for malignant neoplasm of breast: Secondary | ICD-10-CM | POA: Diagnosis not present

## 2019-02-11 DIAGNOSIS — Z01419 Encounter for gynecological examination (general) (routine) without abnormal findings: Secondary | ICD-10-CM | POA: Diagnosis not present

## 2019-02-14 DIAGNOSIS — M545 Low back pain: Secondary | ICD-10-CM | POA: Diagnosis not present

## 2019-02-14 DIAGNOSIS — M5136 Other intervertebral disc degeneration, lumbar region: Secondary | ICD-10-CM | POA: Diagnosis not present

## 2019-02-17 DIAGNOSIS — R600 Localized edema: Secondary | ICD-10-CM | POA: Diagnosis not present

## 2019-02-17 DIAGNOSIS — M150 Primary generalized (osteo)arthritis: Secondary | ICD-10-CM | POA: Diagnosis not present

## 2019-02-17 DIAGNOSIS — M25551 Pain in right hip: Secondary | ICD-10-CM | POA: Diagnosis not present

## 2019-02-17 DIAGNOSIS — Z6830 Body mass index (BMI) 30.0-30.9, adult: Secondary | ICD-10-CM | POA: Diagnosis not present

## 2019-02-17 DIAGNOSIS — M0589 Other rheumatoid arthritis with rheumatoid factor of multiple sites: Secondary | ICD-10-CM | POA: Diagnosis not present

## 2019-02-17 DIAGNOSIS — G4709 Other insomnia: Secondary | ICD-10-CM | POA: Diagnosis not present

## 2019-02-17 DIAGNOSIS — E669 Obesity, unspecified: Secondary | ICD-10-CM | POA: Diagnosis not present

## 2019-02-17 DIAGNOSIS — M5136 Other intervertebral disc degeneration, lumbar region: Secondary | ICD-10-CM | POA: Diagnosis not present

## 2019-02-19 ENCOUNTER — Other Ambulatory Visit: Payer: Self-pay

## 2019-02-19 ENCOUNTER — Ambulatory Visit (INDEPENDENT_AMBULATORY_CARE_PROVIDER_SITE_OTHER): Payer: PPO | Admitting: Psychiatry

## 2019-02-19 DIAGNOSIS — F4323 Adjustment disorder with mixed anxiety and depressed mood: Secondary | ICD-10-CM | POA: Diagnosis not present
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
## 2019-02-20 DIAGNOSIS — Z0000 Encounter for general adult medical examination without abnormal findings: Secondary | ICD-10-CM | POA: Diagnosis not present

## 2019-02-20 DIAGNOSIS — I10 Essential (primary) hypertension: Secondary | ICD-10-CM | POA: Diagnosis not present

## 2019-02-20 DIAGNOSIS — E7800 Pure hypercholesterolemia, unspecified: Secondary | ICD-10-CM | POA: Diagnosis not present

## 2019-02-25 DIAGNOSIS — I2510 Atherosclerotic heart disease of native coronary artery without angina pectoris: Secondary | ICD-10-CM | POA: Diagnosis not present

## 2019-02-25 DIAGNOSIS — E7800 Pure hypercholesterolemia, unspecified: Secondary | ICD-10-CM | POA: Diagnosis not present

## 2019-02-25 DIAGNOSIS — I10 Essential (primary) hypertension: Secondary | ICD-10-CM | POA: Diagnosis not present

## 2019-02-25 DIAGNOSIS — Z0000 Encounter for general adult medical examination without abnormal findings: Secondary | ICD-10-CM | POA: Diagnosis not present

## 2019-02-25 DIAGNOSIS — I252 Old myocardial infarction: Secondary | ICD-10-CM | POA: Diagnosis not present

## 2019-02-27 ENCOUNTER — Other Ambulatory Visit: Payer: Self-pay | Admitting: Cardiovascular Disease

## 2019-03-17 ENCOUNTER — Ambulatory Visit: Payer: PPO | Admitting: Psychiatry

## 2019-03-17 ENCOUNTER — Other Ambulatory Visit: Payer: Self-pay

## 2019-03-17 DIAGNOSIS — F4323 Adjustment disorder with mixed anxiety and depressed mood: Secondary | ICD-10-CM | POA: Diagnosis not present

## 2019-03-17 NOTE — Progress Notes (Signed)
[facility] Counselor Progress Note  Name: Avilene SUI KASPAREK Date: 03/17/2019 MRN: [id number] DOB: 15-Jun-1941 PCP: Deland Pretty, MD  Time spent: 65 minutes   1:00pm to 2:05pm  Symptoms:  Anxiety and some depression , family concerns, irritability  Mental Status Exam:   Appearance:     casual  Behavior:  Sharing  Motor:  Normal (a bit slowed)  Speech/Language:   Clear and Coherent  Affect:  anxious  Mood:  Anxious and some depressed mood ("but less depression")  Thought process:  goal directed  Thought content:    WNL  Sensory/Perceptual disturbances:    WNL  Orientation:  oriented to person, place, time/date, situation, day of week, month of year and year  Attention:  Good  Concentration:  Good  Memory:  Intermediate and recent- is fair per patient  Fund of knowledge:   Good  Insight:    Fair  Judgment:   Good  Impulse Control:  Good   Reported Symptoms:   Anxiety, "occasional depressed mood" **No new meds nor new health concerns since last appt.  Risk Assessment: Danger to Self:  No Self-injurious Behavior: No Danger to Others: No Duty to Warn:no Physical Aggression / Violence:No  Access to Firearms a concern: No  Gang Involvement:No   Subject:  Patient today reports her main symptom as being anxiety, with some depressed mood, irritability.  Very difficult for her to stay out of "worry cycles" about various situations especially family concerns, health concerns, the coronavirus, and the racial bias and violence across the country.  Long history of some anxiety, "some times worse than others".  Reviewed her homework from last session, re: CBT and changing anxious thoughts.  Some success with that but is very difficult for patient as she has struggled for a long time with anxiety.  Is motivated "to keep working on it".  Talked about her difficulty in follow through and also with anxiety within family relationships  Today we worked again some on changes to make in her time spent  watching TV news and being on social media excessively.  Still struggles with thinking her worrying pays off because it helps her be prepared "in case something goes wrong."  Challenged these thoughts with her and talked about how the worrying actually drains people of their energy to face difficulties. Discussed her current anxieties re: virus issues escalating and she seemed calmer after venting her fears and apprehensions as well as the things she misses "like hugging my grandchildren."    Further review of strategies to help her  look for "what may go right versus what may go wrong", and how to detach more from social media and TV news as this makes her even more anxious.  Acknowledged this will be a challenge for patient and she remains receptive.  Reviewed goals again (in Flowsheets).  To see again within 2-3 weeks.   Diagnoses:    ICD-10-CM   1. Adjustment disorder with mixed anxiety and depressed mood  F43.23     Plan of Care:  Patient has family concerns about adult kids and the "whole coronavirus situation", and all the violent protests going on recently.  Son is a Engineer, structural so that adds to her anxiety about the violence as there has been some locally. Talked about these issues and how each are impacting her at this point. Patient to follow through in work on Scientist, research (medical).  Next appt in 3-4 wks.   Shanon Ace, LCSW

## 2019-03-31 DIAGNOSIS — Z1212 Encounter for screening for malignant neoplasm of rectum: Secondary | ICD-10-CM | POA: Diagnosis not present

## 2019-03-31 DIAGNOSIS — Z1211 Encounter for screening for malignant neoplasm of colon: Secondary | ICD-10-CM | POA: Diagnosis not present

## 2019-04-02 DIAGNOSIS — M5416 Radiculopathy, lumbar region: Secondary | ICD-10-CM | POA: Diagnosis not present

## 2019-04-02 DIAGNOSIS — G894 Chronic pain syndrome: Secondary | ICD-10-CM | POA: Diagnosis not present

## 2019-04-02 DIAGNOSIS — M5136 Other intervertebral disc degeneration, lumbar region: Secondary | ICD-10-CM | POA: Diagnosis not present

## 2019-04-15 ENCOUNTER — Ambulatory Visit (INDEPENDENT_AMBULATORY_CARE_PROVIDER_SITE_OTHER): Payer: PPO | Admitting: Psychiatry

## 2019-04-15 ENCOUNTER — Other Ambulatory Visit: Payer: Self-pay

## 2019-04-15 DIAGNOSIS — F4323 Adjustment disorder with mixed anxiety and depressed mood: Secondary | ICD-10-CM | POA: Diagnosis not present

## 2019-04-15 NOTE — Progress Notes (Signed)
[facility] Counselor Progress Note  Name: Andrea Ibarra Date: 04/15/2019 MRN: [id number] DOB: 11/20/1940 PCP: Deland Pretty, MD  Time spent: 60 minutes   1:00pm to 2:00pm  Symptoms:  Anxiety and some depression , family concerns, irritability  Mental Status Exam:   Appearance:     casual  Behavior:  Sharing  Motor:  Normal (a bit slowed)  Speech/Language:   Clear and Coherent  Affect:  anxious  Mood:  Anxious and some depressed mood ("but less depression")  Thought process:  goal directed  Thought content:    WNL  Sensory/Perceptual disturbances:    WNL  Orientation:  oriented to person, place, time/date, situation, day of week, month of year and year  Attention:  Good  Concentration:  Good  Memory:  Intermediate and recent- is fair per patient  Fund of knowledge:   Good  Insight:    Fair  Judgment:   Good  Impulse Control:  Good   Reported Symptoms:   Anxiety, "occasional depressed mood" **No new meds nor new health concerns since last appt.  Risk Assessment: Danger to Self:  No Self-injurious Behavior: No Danger to Others: No Duty to Warn:no Physical Aggression / Violence:No  Access to Firearms a concern: No  Gang Involvement:No   Subject:  Patient today reports symptoms as being anxiety, with some depressed mood, but less irritability.  Family concerns escalated some and it's very difficult for her to stay out of "worry cycles" about her family, health concerns, the coronavirus, and the racial bias and violence across the country.  Patient has long history of some anxiety, and doesn't want medications as she already takes various medication for several different health problems.  Reviewed her homework from prior session, re: CBT and changing anxious thoughts. She has struggled for a long time with anxiety and "worry" but is motivated "to keep working on it".   Today we worked again some on changes to make in her time spent watching TV news and being on social media  excessively.  Also encouraged her not to be discussing world news and trauma as much with other people as that stirs a lot of anxiety for patient.  Seems to be realizing that excessive worrying really does not help prepare her "in case something bad happens", which is what she used to believe.     Again reviewed strategies to help her  look for "what may go right versus what may go wrong", and how to detach more from social media, TV, and  conversations with family and friends that revolve around "bad news and crises"  as all of this makes her even more anxious.  Acknowledged this will be a challenge for patient and she remains receptive.  Reviewed goals again (in Flowsheets).  To see again within 4 weeks.   Diagnoses:    ICD-10-CM   1. Adjustment disorder with mixed anxiety and depressed mood  F43.23     Plan of Care:  Patient has family concerns about adult kids and the "whole coronavirus situation", and all the violent protests going on recently.  Son is a Engineer, structural so that adds to her anxiety about the violence as there has been some locally. Talked again about these issues and how each are impacting her at this point. Patient to follow through in work on Scientist, research (medical).  Next appt in 3-4 wks.   Shanon Ace, LCSW

## 2019-05-07 DIAGNOSIS — J300 Vasomotor rhinitis: Secondary | ICD-10-CM | POA: Diagnosis not present

## 2019-05-07 DIAGNOSIS — R05 Cough: Secondary | ICD-10-CM | POA: Diagnosis not present

## 2019-05-07 DIAGNOSIS — J3089 Other allergic rhinitis: Secondary | ICD-10-CM | POA: Diagnosis not present

## 2019-05-14 ENCOUNTER — Other Ambulatory Visit: Payer: Self-pay

## 2019-05-14 ENCOUNTER — Ambulatory Visit (INDEPENDENT_AMBULATORY_CARE_PROVIDER_SITE_OTHER): Payer: PPO | Admitting: Psychiatry

## 2019-05-14 DIAGNOSIS — F4323 Adjustment disorder with mixed anxiety and depressed mood: Secondary | ICD-10-CM | POA: Diagnosis not present

## 2019-05-14 NOTE — Progress Notes (Signed)
[facility] Counselor Progress Note  Name: Andrea Ibarra Date: 05/14/2019 MRN: [id number] DOB: Jul 23, 1941 PCP: Deland Pretty, MD  Time spent: 60 minutes   1:00pm to 2:00pm  Symptoms:  Anxiety and some depression (mild), family concerns  Mental Status Exam:   Appearance:     casual  Behavior:  Sharing  Motor:  Normal (a bit slowed)  Speech/Language:   Clear and Coherent  Affect:  anxious  Mood:  Anxious and some depressed mood ("but less depression")  Thought process:  goal directed  Thought content:    WNL  Sensory/Perceptual disturbances:    WNL  Orientation:  oriented to person, place, time/date, situation, day of week, month of year and year  Attention:  Good  Concentration:  Good  Memory:  Intermediate and recent- is fair per patient  Fund of knowledge:   Good  Insight:    Fair  Judgment:   Good  Impulse Control:  Good   Reported Symptoms:   Anxiety, "occasional depressed mood" **No new meds nor new health concerns since last appt.  Risk Assessment: Danger to Self:  No Self-injurious Behavior: No Danger to Others: No Duty to Warn:no Physical Aggression / Violence:No  Access to Firearms a concern: No  Gang Involvement:No   Subject:  Patient today reports symptoms as being anxiety, with some depressed mood, but no longer having irritablility.  Worrying a lot and is often "worried that things will go wrong versus right.".  Her depression is lessened and patient does have daily contact with others, although appropriately socially distanced.   Concerns with her family have continued to escalate and contribute to her overall worry and stress level.  Also stresses over the coronavirus issues and racial tensions. Working to stay out of "worry cycles" and trying to decrease negative thinking.  When we work on her thoughts in session, she can respond more positively, however difficult to maintain. Discussed some strategies to help lessen her worrying and negative thinking, as  well as some breathing exercises that could help her better manage her anxiety.  From what she shares, it does sound like she has struggled with anxiety a long time, and others in the family have also had significant anxiety.  She want to continue therapy to help manage her symptoms, and does not want medication, as "I take other medications and just don't want to add more on."  Her anxiety is not debilitating but certainly her quality of like will be better as she progress some and acquires some different strategies to help her manage it more effectively. Am also encouraging her to continue to limit her exposure to "bad news" on TV or online, or in talking about it excessively with family or friends, as this helps perpetuate her symptoms.  *We reviewed her continued homework re: anxiety, worry, and depressed mood.  Her awareness of her anxiousness and what helps or hurts, seems to have improved.  She has become better able to recognize triggers and tries to interrupt anxiety-provoking thoughts.  Working on next step of doing some "grounding" to help her calm herself and her thinking, and be able to see some distortion in her thought patterns. (CBT and being able to change thoughts)     *Again reviewed strategies to help her  look for "what may go right versus what may go wrong", and how to detach more from social media, TV, and  conversations with family and friends that revolve around "bad news and crises"  as all of this makes  her even more anxious. Patient shows good effort and remains motivated.    Diagnoses:    ICD-10-CM   1. Adjustment disorder with mixed anxiety and depressed mood  F43.23     Plan of Care:   Patient to follow through in work on strategies stated above.  Goal review per Flowsheets in chart and progress/effort noted.  Next appt in 3-4 wks.   Shanon Ace, LCSW

## 2019-05-20 DIAGNOSIS — E663 Overweight: Secondary | ICD-10-CM | POA: Diagnosis not present

## 2019-05-20 DIAGNOSIS — Z6829 Body mass index (BMI) 29.0-29.9, adult: Secondary | ICD-10-CM | POA: Diagnosis not present

## 2019-05-20 DIAGNOSIS — M0589 Other rheumatoid arthritis with rheumatoid factor of multiple sites: Secondary | ICD-10-CM | POA: Diagnosis not present

## 2019-05-20 DIAGNOSIS — M5136 Other intervertebral disc degeneration, lumbar region: Secondary | ICD-10-CM | POA: Diagnosis not present

## 2019-05-20 DIAGNOSIS — M722 Plantar fascial fibromatosis: Secondary | ICD-10-CM | POA: Diagnosis not present

## 2019-05-20 DIAGNOSIS — G4709 Other insomnia: Secondary | ICD-10-CM | POA: Diagnosis not present

## 2019-05-20 DIAGNOSIS — R600 Localized edema: Secondary | ICD-10-CM | POA: Diagnosis not present

## 2019-05-20 DIAGNOSIS — M25551 Pain in right hip: Secondary | ICD-10-CM | POA: Diagnosis not present

## 2019-05-20 DIAGNOSIS — M150 Primary generalized (osteo)arthritis: Secondary | ICD-10-CM | POA: Diagnosis not present

## 2019-06-13 ENCOUNTER — Ambulatory Visit (INDEPENDENT_AMBULATORY_CARE_PROVIDER_SITE_OTHER): Payer: PPO | Admitting: Psychiatry

## 2019-06-13 ENCOUNTER — Other Ambulatory Visit: Payer: Self-pay

## 2019-06-13 ENCOUNTER — Ambulatory Visit: Payer: PPO | Admitting: Psychiatry

## 2019-06-13 DIAGNOSIS — F4323 Adjustment disorder with mixed anxiety and depressed mood: Secondary | ICD-10-CM

## 2019-06-13 NOTE — Progress Notes (Signed)
      [facility] Counselor/Therapist Progress Note  Patient ID: Andrea Ibarra, MRN: [id number],    Date: 06/13/2019  Time Spent: 60 minutes   2:00pm to 3:00pm  Treatment Type: Individual Therapy  Reported Symptoms:  Stressed, anxious  Mental Status Exam:  Appearance:   n/a   telehealth     Behavior:  Sharing  Motor:  n/a    telehealth  Speech/Language:   Normal Rate  Affect:  n/a   telehealth  Mood:  anxious  Thought process:  goal directed  Thought content:    WNL  Sensory/Perceptual disturbances:    WNL  Orientation:  oriented to person, place, time/date, situation, day of week, month of year and year  Attention:  Good  Concentration:  Good  Memory:  WNL  Fund of knowledge:   Good  Insight:    Good  Judgment:   Good  Impulse Control:  Good   Risk Assessment: Danger to Self:  No Self-injurious Behavior: No Danger to Others: No Duty to Warn:no Physical Aggression / Violence:No  Access to Firearms a concern: No  Gang Involvement:No   Subjective:  Patient today anxious, stressed, some fears with all the world chaos and racial tensions and the coronavirus.  Fears were less after she talked things through today.  Family concerns and worries a lot.   Interventions: Cognitive Behavioral Therapy and Solution-Oriented/Positive Psychology  Diagnosis:   ICD-10-CM   1. Adjustment disorder with mixed anxiety and depressed mood  F43.23     Plan:   Patient not signing tx plan on computer screen due to [place].  Treatment Goals: Goals may remain on tx plan as patient works on strategies to meet her goals.  Progress will be documented each session in the "Progress" section of Plan.  Long term goal: Reduce overall level, frequency, and intensity of the anxiety so that daily functioning is not impaired.   Short term goal: Increase understanding of beliefs and messages that lead to worry and anxiety.  Patient will share her understanding in written form or verbally in  sessions, or both.   Strategy: Identify, challenge, and replace anxious or negative self-talk with positive, realistic, and empowering self-talk.  Progress: Patient and therapist reviewed her goals and made some changes. Discussed changes that have occurred for her over the past year including moving into a retirement facility which has been an adjustment for her, and dealing with all the pandemic issues especially the social isolation, quarantining, and fears/anxieties about getting the virus, along with so many other uncertainties. Today we discussed each item on her goal plan, along with the revisions.  She is motivated and her homework between now and next appt is to pay close attention to her self-talk, sorting out negative or anxious vs positive, noting which of these has the highest occurrences, and her resulting feelings. For the anxious or negative self-talk, she is to also work to interrupt it and try replacing it with more positive self talk.  Will pick up on this next session with patient and shared a couple examples with her during session today of both the anxious and the more positive self-talk.     Next appt within 3-4 wks.   Shanon Ace, LCSW

## 2019-06-25 DIAGNOSIS — Z23 Encounter for immunization: Secondary | ICD-10-CM | POA: Diagnosis not present

## 2019-07-14 ENCOUNTER — Ambulatory Visit (INDEPENDENT_AMBULATORY_CARE_PROVIDER_SITE_OTHER): Payer: PPO | Admitting: Psychiatry

## 2019-07-14 ENCOUNTER — Other Ambulatory Visit: Payer: Self-pay

## 2019-07-14 DIAGNOSIS — F411 Generalized anxiety disorder: Secondary | ICD-10-CM | POA: Diagnosis not present

## 2019-07-14 NOTE — Progress Notes (Signed)
      [facility] Counselor/Therapist Progress Note  Patient ID: Andrea Ibarra, MRN: [id number],    Date: 07/14/2019  Time Spent: 60 minutes  3:00pm to 4:00pm  Treatment Type: Individual Therapy  Reported Symptoms: anxiety, family concerns  Mental Status Exam:  Appearance:   Neat     Behavior:  Appropriate and Sharing  Motor:  Normal  Speech/Language:   Normal Rate  Affect:  anxious  Mood:  anxious  Thought process:  goal directed  Thought content:    WNL  Sensory/Perceptual disturbances:    WNL  Orientation:  oriented to person, place, time/date, situation, day of week, month of year and year  Attention:  Good  Concentration:  Good  Memory:  some forgetfulness per patient  Fund of knowledge:   Good  Insight:    Good  Judgment:   Good  Impulse Control:  Good   Risk Assessment: Danger to Self:  No Self-injurious Behavior: No Danger to Others: No Duty to Warn:no Physical Aggression / Violence:No  Access to Firearms a concern: No  Gang Involvement:No   Subjective: Patient in today with generalized anxiety, and some fear and anxiety about some family situations.   Interventions: Cognitive Behavioral Therapy and Solution-Oriented/Positive Psychology  Diagnosis:   ICD-10-CM   1. Generalized anxiety disorder  F41.1     Plan:   Patient not signing tx plan on computer screen due to [place].  Treatment Goals: Goals may remain on tx plan as patient works on strategies to meet her goals.  Progress will be documented each session in the "Progress" section of Plan.  Long term goal: Reduce overall level, frequency, and intensity of the anxiety so that daily functioning is not impaired.   Short term goal: Increase understanding of beliefs and messages that lead to worry and anxiety.  Patient will share her understanding in written form or verbally in sessions, or both.   Strategy: Identify, challenge, and replace anxious or negative self-talk with positive,  realistic, and empowering self-talk.  Progress: Patient reports today some progress in her work on her goals. Shared some family concerns and was able to process her fears and anxieties about that. The goals she is already working on should be of help to her in her thoughts/feeling about family situations.  She did follow through in trying to monitor her thoughts and especially catch the anxious/negative thoughts and try to interrupt and replace them with more positive, reality-based thoughts.  Patient reports she wasn't very successful with all of it but did on several occasions at least identify some negative/anxious thoughts.  She is 78 yrs old and acknowledges that she has been an anxious person for a long time so it is difficult to break old habits.  She actually did quite well in session today as we continued the goal work re: anxious/negative thoughts leading to anxious/negative feelings and eventually resulting in anxious/negative behaviors and actions.  As homework she is to continue working [employer] the thought monitoring/interrupting/replacing for anxious and negative thoughts.  Will review homework next session and check for progress and/or difficulties.  Will be including some work on her self-talk at next session. Goal review and progress noted with patient.  Good motivation today.   Next appt in 1 month.   Andrea Ace, LCSW

## 2019-07-24 DIAGNOSIS — D225 Melanocytic nevi of trunk: Secondary | ICD-10-CM | POA: Diagnosis not present

## 2019-07-24 DIAGNOSIS — D1801 Hemangioma of skin and subcutaneous tissue: Secondary | ICD-10-CM | POA: Diagnosis not present

## 2019-07-24 DIAGNOSIS — D2271 Melanocytic nevi of right lower limb, including hip: Secondary | ICD-10-CM | POA: Diagnosis not present

## 2019-07-24 DIAGNOSIS — L821 Other seborrheic keratosis: Secondary | ICD-10-CM | POA: Diagnosis not present

## 2019-07-24 DIAGNOSIS — D220 Melanocytic nevi of lip: Secondary | ICD-10-CM | POA: Diagnosis not present

## 2019-08-06 DIAGNOSIS — G894 Chronic pain syndrome: Secondary | ICD-10-CM | POA: Diagnosis not present

## 2019-08-06 DIAGNOSIS — M5136 Other intervertebral disc degeneration, lumbar region: Secondary | ICD-10-CM | POA: Diagnosis not present

## 2019-08-13 ENCOUNTER — Other Ambulatory Visit: Payer: Self-pay

## 2019-08-13 ENCOUNTER — Ambulatory Visit (INDEPENDENT_AMBULATORY_CARE_PROVIDER_SITE_OTHER): Payer: PPO | Admitting: Psychiatry

## 2019-08-13 DIAGNOSIS — F411 Generalized anxiety disorder: Secondary | ICD-10-CM

## 2019-08-13 NOTE — Addendum Note (Signed)
Addended byShanon Ace on: 08/13/2019 01:59 PM   Modules accepted: Level of Service

## 2019-08-13 NOTE — Progress Notes (Signed)
[facility] Counselor/Therapist Progress Note  Patient ID: Andrea Ibarra, MRN: [id number],    Date: 08/13/2019  Time Spent:  60 minutes  1:00pm to 2:00pm  Virtual Visit with Video Note Connected with patient by a video enabled telemedicine/telehealth application or telephone, with their informed consent, and verified patient privacy and that I am speaking with the correct person using two identifiers. I discussed the limitations, risks, security and privacy concerns of performing psychotherapy and management service by telephone and the availability of in person appointments. I also discussed with the patient that there may be a patient responsible charge related to this service. The patient expressed understanding and agreed to proceed. I discussed the treatment planning with the patient. The patient was provided an opportunity to ask questions and all were answered. The patient agreed with the plan and demonstrated an understanding of the instructions. The patient was advised to call  our office if  symptoms worsen or feel they are in a crisis state and need immediate contact.   Therapist Location: [facility] Patient Location: home   Treatment Type: Individual Therapy  Reported Symptoms: anxiety, some depression  Mental Status Exam:  Appearance:   Casual     Behavior:  Appropriate and Sharing  Motor:  Normal  Speech/Language:   a bit fast at times as her anxiety rose  Affect:  anxious  Mood:  anxious  Thought process:  goal directed  Thought content:    WNL  Sensory/Perceptual disturbances:    WNL  Orientation:  oriented to person, place, time/date, situation, day of week, month of year and year  Attention:  Good  Concentration:  Good  Memory:  WNL  Fund of knowledge:   Good  Insight:    Good  Judgment:   Good  Impulse Control:  Good   Risk Assessment: Danger to Self:  No Self-injurious Behavior: No Danger to Others: No Duty to Warn:no Physical  Aggression / Violence:No  Access to Firearms a concern: No  Gang Involvement:No   Subjective: Patient today reporting anxiety primarily, with some depression. Difficult time coping with pandemic and "all the world tensions".  Worries "some extra" with the violence and with her son being a Engineer, structural.  Is more "confined" now at her retirement community due to surge of coronavirus in our area.    Interventions: Solution-Oriented/Positive Psychology and Ego-Supportive  Diagnosis:   ICD-10-CM   1. Generalized anxiety disorder  F41.1     Plan: Patient not signing tx plan on computer screen due to [place].  Treatment Goals: Goals may remain on tx plan as patient works on strategies to meet her goals. Progress will be documented each session in the "Progress" section of Plan.  Long term goal: Reduce overall level, frequency, and intensity of the anxiety so that daily functioning is not impaired.  Short term goal: Increase understanding of beliefs and messages that lead to worry and anxiety. Patient will share her understanding in written form or verbally in sessions, or both.   Strategy: Identify, challenge, and replace anxious or negative self-talk with positive, realistic, and empowering self-talk.  Progress: Patient is progressing some on her goal of reducing the intensity of her anxiety.  Still having frequent  anxiety but is noticing that the intensity of her anxiety is lessening some.  Her overall speech is less rapid when she is anxious.  Has followed through on suggestions discussed in sessions to help her manage anxiety to try and lower its impact on  her.  Is able to talk about her concerns with the pandemic and violence without as much anxiety.  However she reports struggling some more with anxiety when her retirement home recently has to set firmer guidelines due to coronavirus escalating, which limited patients going in and out of their buildings. Today talked through  current concerns about the increased virus cases and patient shared all the precautions that she is taking.  Also processed how she can take good care of herself while more restricted at her home environment including taking short walks, staying in contact with friends, using her faith which is important to her, family phone calls, and positive self-talk.  Goal review and progress/effort noted with patient.   Next appt within about 4 weeks.   Shanon Ace, LCSW

## 2019-08-21 DIAGNOSIS — G4709 Other insomnia: Secondary | ICD-10-CM | POA: Diagnosis not present

## 2019-08-21 DIAGNOSIS — M722 Plantar fascial fibromatosis: Secondary | ICD-10-CM | POA: Diagnosis not present

## 2019-08-21 DIAGNOSIS — E663 Overweight: Secondary | ICD-10-CM | POA: Diagnosis not present

## 2019-08-21 DIAGNOSIS — M150 Primary generalized (osteo)arthritis: Secondary | ICD-10-CM | POA: Diagnosis not present

## 2019-08-21 DIAGNOSIS — M25551 Pain in right hip: Secondary | ICD-10-CM | POA: Diagnosis not present

## 2019-08-21 DIAGNOSIS — M0589 Other rheumatoid arthritis with rheumatoid factor of multiple sites: Secondary | ICD-10-CM | POA: Diagnosis not present

## 2019-08-21 DIAGNOSIS — Z6829 Body mass index (BMI) 29.0-29.9, adult: Secondary | ICD-10-CM | POA: Diagnosis not present

## 2019-08-21 DIAGNOSIS — M5136 Other intervertebral disc degeneration, lumbar region: Secondary | ICD-10-CM | POA: Diagnosis not present

## 2019-08-21 DIAGNOSIS — R600 Localized edema: Secondary | ICD-10-CM | POA: Diagnosis not present

## 2019-10-09 ENCOUNTER — Ambulatory Visit (INDEPENDENT_AMBULATORY_CARE_PROVIDER_SITE_OTHER): Payer: PPO | Admitting: Psychiatry

## 2019-10-09 DIAGNOSIS — F411 Generalized anxiety disorder: Secondary | ICD-10-CM | POA: Diagnosis not present

## 2019-10-09 NOTE — Progress Notes (Signed)
[facility] Counselor/Therapist Progress Note  Patient ID: Andrea Ibarra, MRN: [id number],    Date: 10/09/2019  Time Spent: 45 minutes 2:00p to 2:45pm  Virtual Visit Note Connected with patient by a video enabled telemedicine/telehealth application or telephone, with their informed consent, and verified patient privacy and that I am speaking with the correct person using two identifiers. I discussed the limitations, risks, security and privacy concerns of performing psychotherapy and management service by telephone and the availability of in person appointments. I also discussed with the patient that there may be a patient responsible charge related to this service. The patient expressed understanding and agreed to proceed. I discussed the treatment planning with the patient. The patient was provided an opportunity to ask questions and all were answered. The patient agreed with the plan and demonstrated an understanding of the instructions. The patient was advised to call  our office if  symptoms worsen or feel they are in a crisis state and need immediate contact.   Therapist Location: [facility] Patient Location: home   Treatment Type: Individual Therapy  Reported Symptoms: anxiety, "worries"  Mental Status Exam:  Appearance:   n/a  telehealth     Behavior:  Sharing  Motor:  n/a  telehealth  Speech/Language:   Normal Rate  Affect:  n/a  telehealth  Mood:  anxious  Thought process:  goal directed  Thought content:    WNL  Sensory/Perceptual disturbances:    WNL  Orientation:  oriented to person, place, time/date, situation, day of week, month of year and year  Attention:  Good  Concentration:  Good  Memory:  some forgetfulness  Fund of knowledge:   Good  Insight:    Good  Judgment:   Good  Impulse Control:  Good   Risk Assessment: Danger to Self:  No Self-injurious Behavior: No Danger to Others: No Duty to Warn:no Physical Aggression / Violence:No   Access to Firearms a concern: No  Gang Involvement:No   Subjective: Patient today reporting anxiety relating to personal issues, family concerns.  Anxiety increased some more recently due to situational family concerns that she was able to process today in session.  Interventions: Cognitive Behavioral Therapy and Solution-Oriented/Positive Psychology  Diagnosis:   ICD-10-CM   1. Generalized anxiety disorder  F41.1      Plan: Patient not signing tx plan on computer screen due to [place].  Treatment Goals: Goals may remain on tx plan as patient works on strategies to meet her goals. Progress will be documented each session in the "Progress" section of Plan.  Long term goal: Reduce overall level, frequency, and intensity of the anxiety so that daily functioning is not impaired.  Short term goal: Increase understanding of beliefs and messages that lead to worry and anxiety. Patient will share her understanding in written form or verbally in sessions, or both.   Strategy: Identify, challenge, and replace anxious or negative self-talk with positive, realistic, and empowering self-talk.  Progress: Patient today reporting anxiety heightened some by recent Covid case increases and all the turmoil recently in the country , and family situations. Processed these areas of concern with patient and she was able to vent freely, especially about family concerns.  Looked at what she can control and what she cannot control. Also to work on staying in the present, rather that in the past or jumping too far into the future and worrying about "what ifs".  Also worked on strengthening her self-talk and thoughts to be less  anxious/negative and to be more calming and encouraging. Overall, she still is working to reduce her anxiety and the intensity is still less than pre-treatment levels. Goal review and progress noted with patient.  Next appt within 2-3 weeks  Shanon Ace,  LCSW

## 2019-10-22 ENCOUNTER — Ambulatory Visit (INDEPENDENT_AMBULATORY_CARE_PROVIDER_SITE_OTHER): Payer: PPO | Admitting: Psychiatry

## 2019-10-22 DIAGNOSIS — F411 Generalized anxiety disorder: Secondary | ICD-10-CM | POA: Diagnosis not present

## 2019-10-22 NOTE — Progress Notes (Signed)
[facility] Counselor/Therapist Progress Note  Patient ID: Andrea Ibarra, MRN: [id number],    Date: 10/22/2019  Time Spent: 60 minutes  12:00noon to 1:00pm   Virtual Visit Note Connected with patient by a video enabled telemedicine/telehealth application or telephone, with their informed consent, and verified patient privacy and that I am speaking with the correct person using two identifiers. I discussed the limitations, risks, security and privacy concerns of performing psychotherapy and management service by telephone and the availability of in person appointments. I also discussed with the patient that there may be a patient responsible charge related to this service. The patient expressed understanding and agreed to proceed. I discussed the treatment planning with the patient. The patient was provided an opportunity to ask questions and all were answered. The patient agreed with the plan and demonstrated an understanding of the instructions. The patient was advised to call  our office if  symptoms worsen or feel they are in a crisis state and need immediate contact.   Therapist Location: [facility] Patient Location: home   Treatment Type: Individual Therapy  Reported Symptoms: anxiety  Mental Status Exam:  Appearance:   n/a   telehealth     Behavior:  Sharing  Motor:  n/a   telehealth  Speech/Language:   Normal Rate  Affect:  n/a   telehealth  Mood:  anxious  Thought process:  goal directed  Thought content:    WNL  Sensory/Perceptual disturbances:    WNL  Orientation:  oriented to person, place, time/date, situation, day of week, month of year and year  Attention:  Good  Concentration:  Good  Memory:  some "forgetfulness" but not worse  Fund of knowledge:   Good  Insight:    Good  Judgment:   Good  Impulse Control:  Good   Risk Assessment: Danger to Self:  No Self-injurious Behavior: No Danger to Others: No Duty to Warn:no Physical Aggression /  Violence:No  Access to Firearms a concern: No  Gang Involvement:No   Subjective: Patient today reporting anxiety and some worrying about world crises and violence, the pandemic,and family concerns.  Interventions: Cognitive Behavioral Therapy and Solution-Oriented/Positive Psychology  Diagnosis:   ICD-10-CM   1. Generalized anxiety disorder  F41.1     Plan: Patient not signing tx plan on computer screen due to [place].  Treatment Goals: Goals may remain on tx plan as patient works on strategies to meet her goals. Progress will be documented each session in the "Progress" section of Plan.  Long term goal: Reduce overall level, frequency, and intensity of the anxiety so that daily functioning is not impaired.  Short term goal: Increase understanding of beliefs and messages that lead to worry and anxiety. Patient will share her understanding in written form or verbally in sessions, or both.   Strategy: Identify, challenge, and replace anxious or negative self-talk with positive, realistic, and empowering self-talk.  Progress: Patient today reports anxiety and she is trying to manage it better by not overly focusing on traumatic world events, the pandemic, and less watching the news online or TV. States with the Covid numbers decreasing some here, she is feeling a little less anxious.  Feels very fortunate to be living in a retirement community around other people during this time of having to socially distance. Processed  further the concerns she has about family situations and eventually worked on her not "worrying" as much and focus instead on "being concerned".  Talked about steps involved in  doing this and is to work on this betweens sessions.  Also to work on interrupting more of her anxious thoughts and replace them with more positive, realistic, and empowering thoughts that do not support anxiety and worry. Continue efforts to stay in the present and focus on what she can  control, versus what she can't. Goal review and progress noted with patient.  Next appt within 3-4 weeks.   Shanon Ace, LCSW

## 2019-10-27 ENCOUNTER — Telehealth: Payer: Self-pay | Admitting: Cardiovascular Disease

## 2019-10-27 NOTE — Telephone Encounter (Signed)
Patient is calling wanting to have an EKG at her 1 year follow up on 02/09/20, but there is no active request in the system. Please advise.

## 2019-10-27 NOTE — Telephone Encounter (Signed)
Informed the patient her EKG will be done at her office visit.  She was grateful for assistance.

## 2019-11-20 DIAGNOSIS — R600 Localized edema: Secondary | ICD-10-CM | POA: Diagnosis not present

## 2019-11-20 DIAGNOSIS — G4709 Other insomnia: Secondary | ICD-10-CM | POA: Diagnosis not present

## 2019-11-20 DIAGNOSIS — M722 Plantar fascial fibromatosis: Secondary | ICD-10-CM | POA: Diagnosis not present

## 2019-11-20 DIAGNOSIS — M5136 Other intervertebral disc degeneration, lumbar region: Secondary | ICD-10-CM | POA: Diagnosis not present

## 2019-11-20 DIAGNOSIS — E669 Obesity, unspecified: Secondary | ICD-10-CM | POA: Diagnosis not present

## 2019-11-20 DIAGNOSIS — M150 Primary generalized (osteo)arthritis: Secondary | ICD-10-CM | POA: Diagnosis not present

## 2019-11-20 DIAGNOSIS — M25551 Pain in right hip: Secondary | ICD-10-CM | POA: Diagnosis not present

## 2019-11-20 DIAGNOSIS — M0589 Other rheumatoid arthritis with rheumatoid factor of multiple sites: Secondary | ICD-10-CM | POA: Diagnosis not present

## 2019-11-20 DIAGNOSIS — Z6830 Body mass index (BMI) 30.0-30.9, adult: Secondary | ICD-10-CM | POA: Diagnosis not present

## 2019-12-02 DIAGNOSIS — M8580 Other specified disorders of bone density and structure, unspecified site: Secondary | ICD-10-CM | POA: Diagnosis not present

## 2019-12-02 DIAGNOSIS — E7800 Pure hypercholesterolemia, unspecified: Secondary | ICD-10-CM | POA: Diagnosis not present

## 2019-12-02 DIAGNOSIS — Z0000 Encounter for general adult medical examination without abnormal findings: Secondary | ICD-10-CM | POA: Diagnosis not present

## 2019-12-09 DIAGNOSIS — E7800 Pure hypercholesterolemia, unspecified: Secondary | ICD-10-CM | POA: Diagnosis not present

## 2019-12-09 DIAGNOSIS — E559 Vitamin D deficiency, unspecified: Secondary | ICD-10-CM | POA: Diagnosis not present

## 2019-12-09 DIAGNOSIS — I2510 Atherosclerotic heart disease of native coronary artery without angina pectoris: Secondary | ICD-10-CM | POA: Diagnosis not present

## 2019-12-09 DIAGNOSIS — I10 Essential (primary) hypertension: Secondary | ICD-10-CM | POA: Diagnosis not present

## 2019-12-10 DIAGNOSIS — M5417 Radiculopathy, lumbosacral region: Secondary | ICD-10-CM | POA: Diagnosis not present

## 2019-12-10 DIAGNOSIS — M5416 Radiculopathy, lumbar region: Secondary | ICD-10-CM | POA: Diagnosis not present

## 2019-12-10 DIAGNOSIS — M5136 Other intervertebral disc degeneration, lumbar region: Secondary | ICD-10-CM | POA: Diagnosis not present

## 2019-12-10 DIAGNOSIS — M47896 Other spondylosis, lumbar region: Secondary | ICD-10-CM | POA: Diagnosis not present

## 2019-12-29 ENCOUNTER — Ambulatory Visit (INDEPENDENT_AMBULATORY_CARE_PROVIDER_SITE_OTHER): Payer: PPO | Admitting: Psychiatry

## 2019-12-29 ENCOUNTER — Other Ambulatory Visit: Payer: Self-pay

## 2019-12-29 DIAGNOSIS — F411 Generalized anxiety disorder: Secondary | ICD-10-CM

## 2019-12-29 NOTE — Progress Notes (Signed)
[facility] Counselor/Therapist Progress Note  Patient ID: Andrea Ibarra, MRN: [id number],    Date: 12/29/2019  Time Spent:  60 minutes   3:00pm to 4:00pm  Treatment Type: Individual Therapy  Reported Symptoms: anxiety, family concerns, some depression   Mental Status Exam:  Appearance:   Well Groomed     Behavior:  Appropriate, Sharing and Motivated  Motor:  Normal  Speech/Language:   Normal Rate  Affect:  anxious  Mood:  anxious  Thought process:  normal  Thought content:    WNL  Sensory/Perceptual disturbances:    WNL  Orientation:  oriented to person, place, time/date, situation, day of week, month of year and year  Attention:  Good  Concentration:  Good  Memory:  forgetfulness "age related" and Dr. knows  Fund of knowledge:   Good  Insight:    Good  Judgment:   Good  Impulse Control:  Good   Risk Assessment: Danger to Self:  No Self-injurious Behavior: No Danger to Others: No Duty to Warn:no Physical Aggression / Violence:No  Access to Firearms a concern: No  Gang Involvement:No   Subjective: Patient today reporting anxiety , "age-related concerns", and family concerns.  Interventions: Cognitive Behavioral Therapy and Ego-Supportive  Diagnosis:   ICD-10-CM   1. Generalized anxiety disorder  F41.1     Plan: Patient not signing tx plan on computer screen due to [place].  Treatment Goals: Goals may remain on tx plan as patient works on strategies to meet her goals. Progress will be documented each session in the "Progress" section of Plan.  Long term goal: Reduce overall level, frequency, and intensity of the anxiety so that daily functioning is not impaired.  Short term goal: Increase understanding of beliefs and messages that lead to worry and anxiety. Patient will share her understanding in written form or verbally in sessions, or both.   Strategy: Identify, challenge, and replace anxious or negative self-talk with positive, realistic,  and empowering self-talk.  Progress: Patient in today with anxiety, family concerns, and some aging concerns. Today wanted to work more on her family concerns which is also connected with her anxiety. Has recently joined the [employer] which she feels good about.  Concerns with family involves some poor habits and some boundary issues. Is doing better with not being on facebook as much as that can cause a rise in her anxiety. Is feeling some encouraged about Covid "getting better and being able to get out some more."  Realizing she can't control family members and that her "worrying about certain things will not help."  Reviewed our work from last session about converting from "being a worrier to being a person of concern", with the difference being that a person of concern doesn't just look for what might go badly but rather has more optimism and realizes that things could go in a positive direction. Encouraged her to keep working on interrupting her anxious thoughts when they come, and replace them with more reality-based, positive, and empowering thoughts that don't lead to anxiety/worry.  Continue to recognize and work on the things I can control versus what I can't control, be looking for the "positives" vs the "negatives", and stay in the present versus the past or too far into the future. Is to focus on these between appts and will follow up next appt.   Goal review and progress noted with patient.  Next appt within 3-4 weeks.   Shanon Ace, LCSW

## 2020-01-01 DIAGNOSIS — M5136 Other intervertebral disc degeneration, lumbar region: Secondary | ICD-10-CM | POA: Diagnosis not present

## 2020-01-02 DIAGNOSIS — H52203 Unspecified astigmatism, bilateral: Secondary | ICD-10-CM | POA: Diagnosis not present

## 2020-01-02 DIAGNOSIS — Z961 Presence of intraocular lens: Secondary | ICD-10-CM | POA: Diagnosis not present

## 2020-01-22 ENCOUNTER — Other Ambulatory Visit: Payer: Self-pay

## 2020-01-22 ENCOUNTER — Ambulatory Visit (INDEPENDENT_AMBULATORY_CARE_PROVIDER_SITE_OTHER): Payer: PPO | Admitting: Psychiatry

## 2020-01-22 DIAGNOSIS — F411 Generalized anxiety disorder: Secondary | ICD-10-CM

## 2020-01-22 NOTE — Progress Notes (Signed)
      [facility] Counselor/Therapist Progress Note  Patient ID: Andrea Ibarra, MRN: [id number],    Date: 01/22/2020  Time Spent: 60 minutes  3:00pm to 4:00pm  Treatment Type: Individual Therapy  Reported Symptoms: anxiety  Mental Status Exam:  Appearance:   Casual     Behavior:  Appropriate, Sharing and Motivated  Motor:  Normal  Speech/Language:   Normal Rate  Affect:  anxious  Mood:  anxious  Thought process:  normal  Thought content:    WNL  Sensory/Perceptual disturbances:    WNL  Orientation:  oriented to person, place, time/date, situation, day of week, month of year and year  Attention:  Good  Concentration:  Good  Memory:  some forgetfulness  Fund of knowledge:   Good  Insight:    Good  Judgment:   Good  Impulse Control:  Good   Risk Assessment: Danger to Self:  No Self-injurious Behavior: No Danger to Others: No Duty to Warn:no Physical Aggression / Violence:No  Access to Firearms a concern: No  Gang Involvement:No   Subjective:  Patient in today with anxiety mostly related to family concerns, health, and other personal issues.  Interventions: Solution-Oriented/Positive Psychology and Ego-Supportive  Diagnosis:   ICD-10-CM   1. Generalized anxiety disorder  F41.1     Plan: Patient not signing tx plan on computer screen due to [place].  Treatment Goals: Goals may remain on tx plan as patient works on strategies to meet her goals. Progress will be documented each session in the "Progress" section of Plan.  Long term goal: Reduce overall level, frequency, and intensity of the anxiety so that daily functioning is not impaired.  Short term goal: Increase understanding of beliefs and messages that lead to worry and anxiety. Patient will share her understanding in written form or verbally in sessions, or both.   Strategy: Identify, challenge, and replace anxious or negative self-talk with positive, realistic, and empowering  self-talk.  Progress: Patient in today with anxiety regarding family concerns, health, and other personal issues. Shared updates on family and other stressors.  Also shared she "officially joined the [employer] and feels good about it. "Worrying a lot" but "am getting out more with things opening up from Covid."  Anxious thoughts and self-talk persist. Today we worked with strategy above in identifying, challenging, and replacing the anxious thoughts and self-talk with more positive, reality-based, and empowering self-talk and thoughts that do not support anxiety. Admits that it is sometimes hard for her to catch herself and try to keep her anxiety from getting so strong.  Discussed things that do sometimes help her including talking with friends/family and others that live in her retirement center, walking some as able, reading, relying on her faith, getting outside, going for a drive,  looking for more positives than negatives, and focusing on what she can control versus can't control.  Goal review and progress noted with patient.  Next appt within 1 month.   Shanon Ace, LCSW

## 2020-02-02 DIAGNOSIS — M5416 Radiculopathy, lumbar region: Secondary | ICD-10-CM | POA: Diagnosis not present

## 2020-02-02 DIAGNOSIS — M5136 Other intervertebral disc degeneration, lumbar region: Secondary | ICD-10-CM | POA: Diagnosis not present

## 2020-02-09 ENCOUNTER — Other Ambulatory Visit: Payer: Self-pay

## 2020-02-09 ENCOUNTER — Ambulatory Visit: Payer: PPO | Admitting: Cardiovascular Disease

## 2020-02-09 ENCOUNTER — Encounter: Payer: Self-pay | Admitting: Cardiovascular Disease

## 2020-02-09 VITALS — BP 108/66 | HR 86 | Ht 59.5 in | Wt 147.4 lb

## 2020-02-09 DIAGNOSIS — I255 Ischemic cardiomyopathy: Secondary | ICD-10-CM | POA: Diagnosis not present

## 2020-02-09 DIAGNOSIS — I2510 Atherosclerotic heart disease of native coronary artery without angina pectoris: Secondary | ICD-10-CM

## 2020-02-09 DIAGNOSIS — E782 Mixed hyperlipidemia: Secondary | ICD-10-CM | POA: Diagnosis not present
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
## 2020-02-09 NOTE — Patient Instructions (Signed)

## 2020-02-12 DIAGNOSIS — M545 Low back pain: Secondary | ICD-10-CM | POA: Diagnosis not present

## 2020-02-12 DIAGNOSIS — R2681 Unsteadiness on feet: Secondary | ICD-10-CM | POA: Diagnosis not present

## 2020-02-12 DIAGNOSIS — M6281 Muscle weakness (generalized): Secondary | ICD-10-CM | POA: Diagnosis not present

## 2020-02-12 DIAGNOSIS — M1990 Unspecified osteoarthritis, unspecified site: Secondary | ICD-10-CM | POA: Diagnosis not present

## 2020-02-17 ENCOUNTER — Ambulatory Visit (INDEPENDENT_AMBULATORY_CARE_PROVIDER_SITE_OTHER): Payer: PPO | Admitting: Psychiatry

## 2020-02-17 ENCOUNTER — Other Ambulatory Visit: Payer: Self-pay

## 2020-02-17 DIAGNOSIS — F411 Generalized anxiety disorder: Secondary | ICD-10-CM

## 2020-02-17 NOTE — Progress Notes (Signed)
      [facility] Counselor/Therapist Progress Note  Patient ID: Andrea Ibarra, MRN: [id number],    Date: 02/17/2020  Time Spent: 60 minutes   3:00pm to 4:00pm  Treatment Type: Individual Therapy  Reported Symptoms: anxiety, "worries", some "mild" depression  Mental Status Exam:  Appearance:   Casual     Behavior:  Appropriate, Sharing and Motivated  Motor:  Normal  Speech/Language:   Normal Rate  Affect:  anxious, some mild depression  Mood:  anxious  Thought process:  goal directed  Thought content:    WNL  Sensory/Perceptual disturbances:    WNL  Orientation:  oriented to person, place, time/date, situation, day of week, month of year and year  Attention:  Good  Concentration:  Good  Memory:  some forgetfulness and worse under stress  Fund of knowledge:   Good  Insight:    Good  Judgment:   Good  Impulse Control:  Good   Risk Assessment: Danger to Self:  No Self-injurious Behavior: No Danger to Others: No Duty to Warn:no Physical Aggression / Violence:No  Access to Firearms a concern: No  Gang Involvement:No   Subjective: Patient in today with anxiety, some mild depression, and worries about family.  Interventions: Solution-Oriented/Positive Psychology and Ego-Supportive  Diagnosis:   ICD-10-CM   1. Generalized anxiety disorder  F41.1     Plan: Patient not signing tx plan on computer screen due to [place].  Treatment Goals: Goals may remain on tx plan as patient works on strategies to meet her goals. Progress will be documented each session in the "Progress" section of Plan.  Long term goal: Reduce overall level, frequency, and intensity of the anxiety so that daily functioning is not impaired.  Short term goal: Increase understanding of beliefs and messages that lead to worry and anxiety. Patient will share her understanding in written form or verbally in sessions, or both.   Strategy: Identify, challenge, and replace anxious or negative  self-talk with positive, realistic, and empowering self-talk.  Progress: Patient in today with anxiety, mild depression, and worries about her family with several situations. Increased anxiety and some depression, mostly relating to her concerns/"worries" about family.  Talked almost non-stop today as she "had a lot to talk about."  Concerned regarding behaviors of some of her family. At times, blames herself but seems to really know that she is really not to blame. Talked through each of her concerns backed up by a lot of anxious thoughts.  Patient realizing there is not a lot she can actually do about extended family matters, and it seemed to really help her to talk through these things confidentially.  Was more grounded and less anxious by session end, and spoke of upcoming plans with friends that are healthy for her. Feeling better with" things opening back up some after being shut down during Covid."  Still encouraging her to self-monitor her anxious thoughts and to work further on recognizing them, interrupting them, and changing them to be more reality-based, positive, and empowering for her.   Goal review and progress noted with patient.  Next appt within an hour.   Shanon Ace, LCSW

## 2020-02-18 ENCOUNTER — Other Ambulatory Visit: Payer: Self-pay | Admitting: Cardiovascular Disease

## 2020-02-18 DIAGNOSIS — R2681 Unsteadiness on feet: Secondary | ICD-10-CM | POA: Diagnosis not present

## 2020-02-18 DIAGNOSIS — M545 Low back pain: Secondary | ICD-10-CM | POA: Diagnosis not present

## 2020-02-18 DIAGNOSIS — M6281 Muscle weakness (generalized): Secondary | ICD-10-CM | POA: Diagnosis not present

## 2020-02-18 DIAGNOSIS — M1990 Unspecified osteoarthritis, unspecified site: Secondary | ICD-10-CM | POA: Diagnosis not present

## 2020-02-24 DIAGNOSIS — M8580 Other specified disorders of bone density and structure, unspecified site: Secondary | ICD-10-CM | POA: Diagnosis not present

## 2020-02-24 DIAGNOSIS — E559 Vitamin D deficiency, unspecified: Secondary | ICD-10-CM | POA: Diagnosis not present

## 2020-02-24 DIAGNOSIS — Z0000 Encounter for general adult medical examination without abnormal findings: Secondary | ICD-10-CM | POA: Diagnosis not present

## 2020-02-24 DIAGNOSIS — E7800 Pure hypercholesterolemia, unspecified: Secondary | ICD-10-CM | POA: Diagnosis not present

## 2020-02-26 DIAGNOSIS — M0589 Other rheumatoid arthritis with rheumatoid factor of multiple sites: Secondary | ICD-10-CM | POA: Diagnosis not present

## 2020-02-26 DIAGNOSIS — M5136 Other intervertebral disc degeneration, lumbar region: Secondary | ICD-10-CM | POA: Diagnosis not present

## 2020-02-26 DIAGNOSIS — G4709 Other insomnia: Secondary | ICD-10-CM | POA: Diagnosis not present

## 2020-02-26 DIAGNOSIS — M25551 Pain in right hip: Secondary | ICD-10-CM | POA: Diagnosis not present

## 2020-02-26 DIAGNOSIS — M150 Primary generalized (osteo)arthritis: Secondary | ICD-10-CM | POA: Diagnosis not present

## 2020-02-26 DIAGNOSIS — E669 Obesity, unspecified: Secondary | ICD-10-CM | POA: Diagnosis not present

## 2020-02-26 DIAGNOSIS — R600 Localized edema: Secondary | ICD-10-CM | POA: Diagnosis not present

## 2020-02-26 DIAGNOSIS — M722 Plantar fascial fibromatosis: Secondary | ICD-10-CM | POA: Diagnosis not present

## 2020-02-26 DIAGNOSIS — Z6831 Body mass index (BMI) 31.0-31.9, adult: Secondary | ICD-10-CM | POA: Diagnosis not present

## 2020-03-02 DIAGNOSIS — I10 Essential (primary) hypertension: Secondary | ICD-10-CM | POA: Diagnosis not present

## 2020-03-02 DIAGNOSIS — Z0000 Encounter for general adult medical examination without abnormal findings: Secondary | ICD-10-CM | POA: Diagnosis not present

## 2020-03-02 DIAGNOSIS — I2510 Atherosclerotic heart disease of native coronary artery without angina pectoris: Secondary | ICD-10-CM | POA: Diagnosis not present

## 2020-03-02 DIAGNOSIS — E7800 Pure hypercholesterolemia, unspecified: Secondary | ICD-10-CM | POA: Diagnosis not present

## 2020-03-03 DIAGNOSIS — L602 Onychogryphosis: Secondary | ICD-10-CM | POA: Diagnosis not present

## 2020-03-11 ENCOUNTER — Ambulatory Visit (INDEPENDENT_AMBULATORY_CARE_PROVIDER_SITE_OTHER): Payer: PPO | Admitting: Psychiatry

## 2020-03-11 ENCOUNTER — Other Ambulatory Visit: Payer: Self-pay

## 2020-03-11 DIAGNOSIS — F411 Generalized anxiety disorder: Secondary | ICD-10-CM

## 2020-03-11 NOTE — Progress Notes (Signed)
[facility] Counselor/Therapist Progress Note  Patient ID: Khanh ELEANORE JUNIO, MRN: [id number],    Date: 03/11/2020  Time Spent: 60 minutes   3:00pm to 4:00pm  Treatment Type: Individual Therapy  Reported Symptoms: anxiety   Mental Status Exam:  Appearance:   Well Groomed     Behavior:  Appropriate, Sharing and Motivated  Motor:  Normal  Speech/Language:   escalated some initially due to anxiety  Affect:  anxiety  Mood:  anxious  Thought process:  normal  Thought content:    WNL  Sensory/Perceptual disturbances:    WNL  Orientation:  oriented to person, place, time/date, situation, day of week, month of year and year  Attention:  Good  Concentration:  Good and Fair  Memory:  some forgetfulness  Fund of knowledge:   Good  Insight:    Good  Judgment:   Good  Impulse Control:  Good   Risk Assessment: Danger to Self:  No Self-injurious Behavior: No Danger to Others: No Duty to Warn:no Physical Aggression / Violence:No  Access to Firearms a concern: No  Gang Involvement:No   Subjective: Patient today reports anxiety and some fearfulness with so much going on in family and the world.   Interventions: Solution-Oriented/Positive Psychology and Ego-Supportive  Diagnosis:   ICD-10-CM   1. Generalized anxiety disorder  F41.1     Plan: Patient not signing tx plan on computer screen due to [place].  Treatment Goals: Goals may remain on tx plan as patient works on strategies to meet her goals. Progress will be documented each session in the "Progress" section of Plan.  Long term goal: Reduce overall level, frequency, and intensity of the anxiety so that daily functioning is not impaired.  Short term goal: Increase understanding of beliefs and messages that lead to worry and anxiety. Patient will share her understanding in written form or verbally in sessions, or both.   Strategy: Identify, challenge, and replace anxious or negative self-talk with positive,  realistic, and empowering self-talk.  Progress: Patient in today reporting anxiety mostly regarding family concerns and "what all is going on in the world today."  Also is anxious about several of her friends/neighbors who have had health issues recently at her retirement community.  Was clearly anxious initially talking quite fast but the more the talked, the more her speech became more of a normal speech. Still lots of concerns within her family, Needed her session today to talk through these concerns and her worries tent to "build up and do get better after talking and trying to deal with my stress."  Was definitely less anxious and grounded by session end. Per short term goal above, patient will work to increase her understanding of specific beliefs and messages that tend to lead to more worry and anxiety for her.  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  We will follow-up next session and have her verbally speak more about some of her long-term police and messages that support worrying and anxiety. Encouraged her to practice good self-care including spending time with friends who are healthy for her, walking as she is able and at whatever pace she is able, healthy nutrition, and being as active as possible at her church which she always says helps her, and also practicing more positive self talk which she did some in later part of session today.  Goal review and progress noted with patient.  Next appointment within 1 month.   Shanon Ace, LCSW

## 2020-03-22 DIAGNOSIS — M545 Low back pain: Secondary | ICD-10-CM | POA: Diagnosis not present

## 2020-03-22 DIAGNOSIS — M6281 Muscle weakness (generalized): Secondary | ICD-10-CM | POA: Diagnosis not present

## 2020-03-22 DIAGNOSIS — R2681 Unsteadiness on feet: Secondary | ICD-10-CM | POA: Diagnosis not present

## 2020-03-22 DIAGNOSIS — M1990 Unspecified osteoarthritis, unspecified site: Secondary | ICD-10-CM | POA: Diagnosis not present

## 2020-03-29 ENCOUNTER — Ambulatory Visit (INDEPENDENT_AMBULATORY_CARE_PROVIDER_SITE_OTHER): Payer: PPO | Admitting: Psychiatry

## 2020-03-29 DIAGNOSIS — F411 Generalized anxiety disorder: Secondary | ICD-10-CM | POA: Diagnosis not present

## 2020-03-29 NOTE — Progress Notes (Signed)
[facility] Counselor/Therapist Progress Note  Patient ID: Andrea Ibarra, MRN: [id number],    Date: 03/29/2020  Time Spent: 60 minutes  3:00pm to 4:00pm  Virtual Visit Note via Programmer, systems with patient by a video enabled telemedicine/telehealth application or telephone, with their informed consent, and verified patient privacy and that I am speaking with the correct person using two identifiers. I discussed the limitations, risks, security and privacy concerns of performing psychotherapy and management service by telephone and the availability of in person appointments. I also discussed with the patient that there may be a patient responsible charge related to this service. The patient expressed understanding and agreed to proceed. I discussed the treatment planning with the patient. The patient was provided an opportunity to ask questions and all were answered. The patient agreed with the plan and demonstrated an understanding of the instructions. The patient was advised to call  our office if  symptoms worsen or feel they are in a crisis state and need immediate contact.   Therapist Location: [facility] Patient Location: home   Treatment Type: Individual Therapy  Reported Symptoms: anxiety   Mental Status Exam:  Appearance:   Casual     Behavior:  Appropriate, Sharing and Motivated  Motor:  Normal  Speech/Language:   Normal Rate  Affect:  anxious  Mood:  anxious  Thought process:  goal directed  Thought content:    WNL  Sensory/Perceptual disturbances:    WNL  Orientation:  oriented to person, place, time/date, situation, day of week, month of year and year  Attention:  Good  Concentration:  Good and Fair  Memory:  some forgetfulness and worse under stress  Fund of knowledge:   Good  Insight:    Good and Fair  Judgment:   Good  Impulse Control:  Good   Risk Assessment: Danger to Self:  No Self-injurious Behavior: No Danger to Others: No Duty  to Warn:no Physical Aggression / Violence:No  Access to Firearms a concern: No  Gang Involvement:No   Subjective: Patient today reports anxiety mostly about family situations, health, world issues, and some personal concerns.  Interventions: Solution-Oriented/Positive Psychology and Ego-Supportive  Diagnosis:   ICD-10-CM   1. Generalized anxiety disorder  F41.1     Plan: Patient not signing tx plan on computer screen due to [place].  Treatment Goals: Goals may remain on tx plan as patient works on strategies to meet her goals. Progress will be documented each session in the "Progress" section of Plan.  Long term goal: Reduce overall level, frequency, and intensity of the anxiety so that daily functioning is not impaired.  Short term goal: Increase understanding of beliefs and messages that lead to worry and anxiety. Patient will share her understanding in written form or verbally in sessions, or both.   Strategy: Identify, challenge, and replace anxious or negative self-talk with positive, realistic, and empowering self-talk.  Progress: Patient today reporting anxiety that is "a bit worse more recently".  Anxiety has increased with all the construction and changes/rearranges at her retirement facility where she lives. Is easily affected with these changes and understandable as it will temporarily affect some of the things she is used to at the facility and also affect the ways of doing certain things (also temporary change). Focused on helping patient work on her anxiety about all the construction issues surrounding her residential facility, her family concerns, and her health/personal issues. Also encouraged less watching on world new online as she is  easily impacted by some of the discouraging world situations.  She has been tuning in less to the news and agrees to continue that strategy for now.  To intentionally fill some of her time with contact with friends, walking as  able, getting outside at least some each day, contact with her church which she always feels is helpful, and looking more for the positive versus the negatives and how practicing this more and more will help her develop it as a good habit to cotinue.   Goal review and progress/challenges noted with patient.  Next appt within 1 month.   Shanon Ace, LCSW

## 2020-04-07 ENCOUNTER — Other Ambulatory Visit: Payer: Self-pay

## 2020-04-07 ENCOUNTER — Emergency Department (HOSPITAL_BASED_OUTPATIENT_CLINIC_OR_DEPARTMENT_OTHER): Payer: PPO

## 2020-04-07 ENCOUNTER — Encounter (HOSPITAL_BASED_OUTPATIENT_CLINIC_OR_DEPARTMENT_OTHER): Payer: Self-pay

## 2020-04-07 ENCOUNTER — Emergency Department (HOSPITAL_BASED_OUTPATIENT_CLINIC_OR_DEPARTMENT_OTHER)
Admission: EM | Admit: 2020-04-07 | Discharge: 2020-04-07 | Disposition: A | Discharge status: Home / Self Care | Payer: PPO | Attending: Emergency Medicine | Admitting: Emergency Medicine

## 2020-04-07 DIAGNOSIS — Z87891 Personal history of nicotine dependence: Secondary | ICD-10-CM | POA: Diagnosis not present

## 2020-04-07 DIAGNOSIS — K5909 Other constipation: Secondary | ICD-10-CM | POA: Diagnosis not present

## 2020-04-07 DIAGNOSIS — I2510 Atherosclerotic heart disease of native coronary artery without angina pectoris: Secondary | ICD-10-CM | POA: Insufficient documentation

## 2020-04-07 DIAGNOSIS — R1032 Left lower quadrant pain: Secondary | ICD-10-CM | POA: Insufficient documentation

## 2020-04-07 DIAGNOSIS — K6389 Other specified diseases of intestine: Secondary | ICD-10-CM | POA: Diagnosis not present

## 2020-04-07 DIAGNOSIS — K828 Other specified diseases of gallbladder: Secondary | ICD-10-CM

## 2020-04-07 DIAGNOSIS — Z79899 Other long term (current) drug therapy: Secondary | ICD-10-CM | POA: Insufficient documentation

## 2020-04-07 DIAGNOSIS — K5730 Diverticulosis of large intestine without perforation or abscess without bleeding: Secondary | ICD-10-CM | POA: Diagnosis not present

## 2020-04-07 DIAGNOSIS — J45909 Unspecified asthma, uncomplicated: Secondary | ICD-10-CM | POA: Insufficient documentation

## 2020-04-07 DIAGNOSIS — I10 Essential (primary) hypertension: Secondary | ICD-10-CM | POA: Diagnosis not present

## 2020-04-07 DIAGNOSIS — K5792 Diverticulitis of intestine, part unspecified, without perforation or abscess without bleeding: Secondary | ICD-10-CM

## 2020-04-07 DIAGNOSIS — K822 Perforation of gallbladder: Secondary | ICD-10-CM | POA: Diagnosis not present

## 2020-04-07 DIAGNOSIS — Z96651 Presence of right artificial knee joint: Secondary | ICD-10-CM | POA: Insufficient documentation

## 2020-04-07 DIAGNOSIS — Z7982 Long term (current) use of aspirin: Secondary | ICD-10-CM | POA: Diagnosis not present

## 2020-04-07 LAB — URINALYSIS, ROUTINE W REFLEX MICROSCOPIC
Bilirubin Urine: NEGATIVE
Glucose, UA: NEGATIVE mg/dL
Ketones, ur: NEGATIVE mg/dL
Nitrite: NEGATIVE
Protein, ur: NEGATIVE mg/dL
Specific Gravity, Urine: 1.01 (ref 1.005–1.030)
pH: 6 (ref 5.0–8.0)

## 2020-04-07 LAB — COMPREHENSIVE METABOLIC PANEL
ALT: 18 U/L (ref 0–44)
AST: 32 U/L (ref 15–41)
Albumin: 4 g/dL (ref 3.5–5.0)
Alkaline Phosphatase: 58 U/L (ref 38–126)
Anion gap: 11 (ref 5–15)
BUN: 16 mg/dL (ref 8–23)
CO2: 23 mmol/L (ref 22–32)
Calcium: 9.1 mg/dL (ref 8.9–10.3)
Chloride: 104 mmol/L (ref 98–111)
Creatinine, Ser: 0.88 mg/dL (ref 0.44–1.00)
GFR calc Af Amer: >60 mL/min (ref >60)
GFR calc non Af Amer: >60 mL/min (ref >60)
Glucose, Bld: 142 mg/dL — ABNORMAL HIGH (ref 70–99)
Potassium: 4.1 mmol/L (ref 3.5–5.1)
Sodium: 138 mmol/L (ref 135–145)
Total Bilirubin: 0.9 mg/dL (ref 0.3–1.2)
Total Protein: 6.7 g/dL (ref 6.5–8.1)

## 2020-04-07 LAB — CBC
HCT: 38 % (ref 36.0–46.0)
Hemoglobin: 12.9 g/dL (ref 12.0–15.0)
MCH: 32.7 pg (ref 26.0–34.0)
MCHC: 33.9 g/dL (ref 30.0–36.0)
MCV: 96.4 fL (ref 80.0–100.0)
Platelets: 195 10*3/uL (ref 150–400)
RBC: 3.94 MIL/uL (ref 3.87–5.11)
RDW: 12 % (ref 11.5–15.5)
WBC: 9.8 10*3/uL (ref 4.0–10.5)
nRBC: 0 % (ref 0.0–0.2)

## 2020-04-07 LAB — URINALYSIS, MICROSCOPIC (REFLEX)

## 2020-04-07 LAB — LIPASE, BLOOD: Lipase: 32 U/L (ref 11–51)

## 2020-04-07 MED ORDER — METRONIDAZOLE IN NACL 5-0.79 MG/ML-% IV SOLN
500.0000 mg | Freq: Once | INTRAVENOUS | Status: AC
  Administered 2020-04-07: 500 mg via INTRAVENOUS
  Filled 2020-04-07: qty 100

## 2020-04-07 MED ORDER — SODIUM CHLORIDE 0.9 % IV SOLN
2.0000 g | Freq: Once | INTRAVENOUS | Status: AC
  Administered 2020-04-07: 2 g via INTRAVENOUS
  Filled 2020-04-07: qty 2

## 2020-04-07 MED ORDER — IOHEXOL 300 MG/ML  SOLN
100.0000 mL | Freq: Once | INTRAMUSCULAR | Status: AC | PRN
  Administered 2020-04-07: 100 mL via INTRAVENOUS

## 2020-04-07 MED ORDER — METRONIDAZOLE 500 MG PO TABS
500.0000 mg | ORAL_TABLET | Freq: Three times a day (TID) | ORAL | 0 refills | Status: DC
Start: 2020-04-07 — End: 2022-10-05

## 2020-04-07 MED ORDER — CLARITHROMYCIN 500 MG PO TABS
500.0000 mg | ORAL_TABLET | Freq: Two times a day (BID) | ORAL | 0 refills | Status: DC
Start: 2020-04-07 — End: 2022-10-05

## 2020-04-07 MED ORDER — BISACODYL 10 MG RE SUPP
10.0000 mg | Freq: Once | RECTAL | Status: AC
  Administered 2020-04-07: 10 mg via RECTAL
  Filled 2020-04-07: qty 1

## 2020-04-07 MED ORDER — SODIUM CHLORIDE 0.9 % IV SOLN
INTRAVENOUS | Status: DC | PRN
  Administered 2020-04-07: 250 mL via INTRAVENOUS

## 2020-04-07 MED ORDER — ACETAMINOPHEN 500 MG PO TABS
1000.0000 mg | ORAL_TABLET | Freq: Once | ORAL | Status: AC
  Administered 2020-04-07: 1000 mg via ORAL
  Filled 2020-04-07: qty 2

## 2020-04-07 MED FILL — METRONIDAZOLE 500 MG TABS: 500 | 7 days supply | Qty: 21 | Fill #0

## 2020-04-07 MED FILL — CLARITHROMYCIN 500 MG TAB: 500 | 7 days supply | Qty: 14 | Fill #0

## 2020-04-07 NOTE — ED Triage Notes (Signed)
Pt c/o "stomach cramps and constipated"-NAD-slow steady gait with own cane
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
## 2020-04-07 NOTE — Progress Notes (Signed)
MEDICATION RELATED CONSULT NOTE - INITIAL   Pharmacy Consult for antibiotic review  Indication: multiple med allergies  Allergies  Allergen Reactions  . Ciprofloxacin Hcl Other (See Comments)    Severe muscle pain  . Codeine Nausea Only and Nausea And Vomiting  . Erythromycin Hives  . Levofloxacin Nausea Only and Nausea And Vomiting    And severe muscle pain  . Metoprolol Tartrate Other (See Comments)    Hot flashes  . Morphine Nausea Only, Other (See Comments) and Nausea And Vomiting    And severe HA  . Penicillins Hives    Has patient had a PCN reaction causing immediate rash, facial/tongue/throat swelling, SOB or lightheadedness with hypotension: Yes Has patient had a PCN reaction causing severe rash involving mucus membranes or skin necrosis: No Has patient had a PCN reaction that required hospitalization: No Has patient had a PCN reaction occurring within the last 10 years: No If all of the above answers are "NO", then may proceed with Cephalosporin use.   Marland Kitchen Percocet [Oxycodone-Acetaminophen] Nausea Only    And severe HA  . Vancomycin Hives  . Azithromycin   . Ceftin [Cefuroxime Axetil] Hives  . Oxycodone-Acetaminophen Nausea And Vomiting  . Sulfa Antibiotics Other (See Comments)  . Doxycycline Other (See Comments)    Does not remember.   . Other Other (See Comments)    Anesthesia needs anti nausea meds with this   . Oxycodone Nausea Only and Nausea And Vomiting    And severe HA  . Sulfonamide Derivatives Other (See Comments)    Does not remember..    Patient Measurements: Height: 4\' 11"  (149.9 cm) Weight: 67.3 kg (148 lb 6.4 oz) IBW/kg (Calculated) : 43.2  Vital Signs: Temp: 98.3 F (36.8 C) (07/21 1140) Temp Source: Oral (07/21 1140) BP: 130/61 (07/21 1525) Pulse Rate: 92 (07/21 1525) Intake/Output from previous day: No intake/output data recorded. Intake/Output from this shift: Total I/O In: 97 [IV Piggyback:97] Out: -   Labs: Recent Labs     04/07/20 1211  WBC 9.8  HGB 12.9  HCT 38.0  PLT 195  CREATININE 0.88  ALBUMIN 4.0  PROT 6.7  AST 32  ALT 18  ALKPHOS 58  BILITOT 0.9   Estimated Creatinine Clearance: 43.2 mL/min (by C-G formula based on SCr of 0.88 mg/dL).   Microbiology: No results found for this or any previous visit (from the past 720 hour(s)).  Medical History: Past Medical History:  Diagnosis Date  . Arthritis    ra and oa  . Asthma    as child  . Bursitis    right hip  . CAD (coronary artery disease)    significant with PTCA, MI in 1996  . Dyslipidemia   . Fibromyalgia   . H/O: hysterectomy   . Headache    migraines years ago  . History of echocardiogram    a. Echo 6/17: Mild concentric LVH, EF 45-50%, inferolateral HK, grade 1 diastolic dysfunction, normal RVSF, mild TR, trivial pericardial effusion  . Hypertension   . Myocardial infarction ([place])    remotely in 1996  . Pneumonia    as teenager  . PONV (postoperative nausea and vomiting)   . 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 Vertigo     Medications:  Scheduled:  . bisacodyl  10 mg Rectal Once    Assessment: Patient with multiple antibiotic allergies; MD consulted for available agents    Plan:   Recommended aztreonam as possible therapy that avoided patient previous  allergy reactions.  Patient tolerated single dose of aztreonam and now will be discharged on biaxin (has tolerated in past per patient) and metronidazole.   Rhet Rorke A Teanna Elem 04/07/2020,4:16 PM

## 2020-04-07 NOTE — Discharge Instructions (Addendum)
It was our pleasure to provide your ER care today - we hope that you feel better.  For constipation:  Rest. Drink plenty of fluids. Get adequate fiber in diet. Take colace (stool softener) 2x/day. Take miralax (laxative) once per day as need.   For diverticulitis: take antibiotics (biaxin + metronidazole) as prescribed.   Follow up with primary care doctor in the coming week if symptoms fail to improve/resolve.  Return to ER if worse, new symptoms, worsening or severe pain, persistent vomiting, high fevers, or other concern.   On your ct scan

## 2020-04-07 NOTE — ED Notes (Signed)
Had diarrhea from Thursday to Monday  Then constipation started after that  After   Rectum hurts she states and no vomiting, a very small amt of   Nausea but thinks  It is  nerves

## 2020-04-07 NOTE — ED Notes (Signed)
Unable to provide a urine sample at this time.

## 2020-04-07 NOTE — ED Notes (Signed)
Iv infiltrated in CT restarted

## 2020-04-19 DIAGNOSIS — K921 Melena: Secondary | ICD-10-CM | POA: Diagnosis not present

## 2020-04-19 DIAGNOSIS — K5792 Diverticulitis of intestine, part unspecified, without perforation or abscess without bleeding: Secondary | ICD-10-CM | POA: Diagnosis not present

## 2020-04-22 DIAGNOSIS — Z1212 Encounter for screening for malignant neoplasm of rectum: Secondary | ICD-10-CM | POA: Diagnosis not present

## 2020-04-28 ENCOUNTER — Other Ambulatory Visit: Payer: Self-pay

## 2020-04-28 ENCOUNTER — Ambulatory Visit (INDEPENDENT_AMBULATORY_CARE_PROVIDER_SITE_OTHER): Payer: PPO | Admitting: Psychiatry

## 2020-04-28 DIAGNOSIS — F411 Generalized anxiety disorder: Secondary | ICD-10-CM | POA: Diagnosis not present

## 2020-04-28 NOTE — Progress Notes (Signed)
      [facility] Counselor/Therapist Progress Note  Patient ID: Andrea Ibarra, MRN: [id number],    Date: 04/28/2020  Time Spent: 60 minutes   1:00pm to 2:00ipm   Treatment Type: Individual Therapy  Reported Symptoms: anxiety, family concerns  Mental Status Exam:  Appearance:   Well Groomed     Behavior:  Appropriate, Sharing and Motivated  Motor:  Normal  Speech/Language:   Clear and Coherent  Affect:  anxious  Mood:  anxious  Thought process:  goal directed  Thought content:    WNL  Sensory/Perceptual disturbances:    WNL  Orientation:  oriented to person, place, time/date, situation, day of week, month of year and year  Attention:  Good  Concentration:  Good  Memory:  WNL  Fund of knowledge:   Good  Insight:    Good and Fair  Judgment:   Good  Impulse Control:  Good   Risk Assessment: Danger to Self:  No Self-injurious Behavior: No Danger to Others: No Duty to Warn:no Physical Aggression / Violence:No  Access to Firearms a concern: No  Gang Involvement:No   Subjective:  Patient today reporting anxiety and family concerns, and concerns/anxiety about world and Covid surge more recently.  Interventions: Solution-Oriented/Positive Psychology and Ego-Supportive  Diagnosis:   ICD-10-CM   1. Generalized anxiety disorder  F41.1     Plan: Patient not signing tx plan on computer screen due to [place].  Treatment Goals: Goals may remain on tx plan as patient works on strategies to meet her goals. Progress will be documented each session in the "Progress" section of Plan.  Long term goal: Reduce overall level, frequency, and intensity of the anxiety so that daily functioning is not impaired.  Short term goal: Increase understanding of beliefs and messages that lead to worry and anxiety. Patient will share her understanding in written form or verbally in sessions, or both.   Strategy: Identify, challenge, and replace anxious or negative self-talk with  positive, realistic, and empowering self-talk.  Progress: Patient in today  Reporting anxiety with some health concerns recently and family concerns. Had to go to ER with diverticulitis recently which was very stressful for patient. Has been more stressed and having anxious thoughts which we discussed and worked on today, focusing on recognizing the anxious thoughts more quickly, interrupting them and replacing with more positive, reality-based, and empowering thoughts that do not support anxiety and stress.  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  Encouraged to stay in touch with friends, get outside some each day, walking as she is able even within her residential building at her retirement center facility, and look for more positives than negatives, while staying in the present and not jumping to forehead which increases her worrying and anxiety.  Shows good effort and motivation today and reports feeling better after having talked some things out today.  Encouraged her to continue to take precautions with the increase in COVID recently and she feels confident about doing so.     Goal review and progress noted with patient.  Next appt within 1 month.   Shanon Ace, LCSW

## 2020-05-03 DIAGNOSIS — M5136 Other intervertebral disc degeneration, lumbar region: Secondary | ICD-10-CM | POA: Diagnosis not present

## 2020-05-03 DIAGNOSIS — M5416 Radiculopathy, lumbar region: Secondary | ICD-10-CM | POA: Diagnosis not present

## 2020-05-06 DIAGNOSIS — J300 Vasomotor rhinitis: Secondary | ICD-10-CM | POA: Diagnosis not present

## 2020-05-06 DIAGNOSIS — R05 Cough: Secondary | ICD-10-CM | POA: Diagnosis not present

## 2020-05-06 DIAGNOSIS — J3089 Other allergic rhinitis: Secondary | ICD-10-CM | POA: Diagnosis not present

## 2020-05-27 DIAGNOSIS — M0589 Other rheumatoid arthritis with rheumatoid factor of multiple sites: Secondary | ICD-10-CM | POA: Diagnosis not present

## 2020-05-31 DIAGNOSIS — Z1231 Encounter for screening mammogram for malignant neoplasm of breast: Secondary | ICD-10-CM | POA: Diagnosis not present

## 2020-05-31 DIAGNOSIS — Z6830 Body mass index (BMI) 30.0-30.9, adult: Secondary | ICD-10-CM | POA: Diagnosis not present

## 2020-05-31 DIAGNOSIS — Z01419 Encounter for gynecological examination (general) (routine) without abnormal findings: Secondary | ICD-10-CM | POA: Diagnosis not present

## 2020-06-01 ENCOUNTER — Ambulatory Visit (INDEPENDENT_AMBULATORY_CARE_PROVIDER_SITE_OTHER): Payer: PPO | Admitting: Psychiatry

## 2020-06-01 ENCOUNTER — Other Ambulatory Visit: Payer: Self-pay

## 2020-06-01 DIAGNOSIS — F411 Generalized anxiety disorder: Secondary | ICD-10-CM

## 2020-06-01 NOTE — Progress Notes (Signed)
      [facility] Counselor/Therapist Progress Note  Patient ID: Andrea Ibarra, MRN: [id number],    Date: 06/01/2020  Time Spent: 60 minutes  3:00pm to 4:00pm  Treatment Type: Individual Therapy  Reported Symptoms: anxiety, some depression (mild), family concerns  Mental Status Exam:  Appearance:   Casual     Behavior:  Appropriate, Sharing and Motivated  Motor:  Normal  Speech/Language:   Clear and Coherent  Affect:  anxious  Mood:  anxious  Thought process:  normal  Thought content:    some obsessiveness  Sensory/Perceptual disturbances:    WNL  Orientation:  oriented to person, place, time/date, situation, day of week, month of year and year  Attention:  Good  Concentration:  Good and Fair  Memory:  some forgetfulness and doctor is aware  Fund of knowledge:   Good  Insight:    Fair  Judgment:   Good and Fair  Impulse Control:  Good   Risk Assessment: Danger to Self:  No Self-injurious Behavior: No Danger to Others: No Duty to Warn:no Physical Aggression / Violence:No  Access to Firearms a concern: No  Gang Involvement:No   Subjective:  Patient today reports anxiety, stress, mild depression, and family concerns.  Interventions: Cognitive Behavioral Therapy and Solution-Oriented/Positive Psychology  Diagnosis:   ICD-10-CM   1. Generalized anxiety disorder  F41.1     Plan: Patient not signing tx plan on computer screen due to [place].  Treatment Goals: Goals may remain on tx plan as patient works on strategies to meet her goals. Progress will be documented each session in the "Progress" section of Plan.  Long term goal: Reduce overall level, frequency, and intensity of the anxiety so that daily functioning is not impaired.  Short term goal: Increase understanding of beliefs and messages that lead to worry and anxiety. Patient will share her understanding in written form or verbally in sessions, or both.   Strategy: Identify, challenge, and  replace anxious or negative self-talk with positive, realistic, and empowering self-talk.  Progress: Patient in today reporting anxiety, some frustrations, mild depression, stress, and family concerns. Did well in processing her concerns and fears re: some family situations that are contributing to her stress, anxiety, frustration, and mild depression. Realizes she cannot change other family and their situations but is upset by some of what is happening within onther family relationships. Seemed to feel more grounded after talking today.  Depression has been lessening. Encouraged good self-care including emotional and physical, and staying in contact with her support network of friends.  Also to work more with her anxious thoughts and trying to interrupt them, challenge them, and replace with more reality based, positive, and encouraging thoughts that do not support anxiety.  Goal review and progress/challenges noted with patient.  Next appointment within 3 to 4 weeks.   Shanon Ace, LCSW

## 2020-06-15 DIAGNOSIS — Z23 Encounter for immunization: Secondary | ICD-10-CM | POA: Diagnosis not present

## 2020-06-29 ENCOUNTER — Ambulatory Visit: Payer: PPO | Admitting: Psychiatry

## 2020-07-22 ENCOUNTER — Ambulatory Visit (INDEPENDENT_AMBULATORY_CARE_PROVIDER_SITE_OTHER): Payer: PPO | Admitting: Psychiatry

## 2020-07-22 ENCOUNTER — Other Ambulatory Visit: Payer: Self-pay

## 2020-07-22 DIAGNOSIS — F411 Generalized anxiety disorder: Secondary | ICD-10-CM | POA: Diagnosis not present

## 2020-07-22 NOTE — Progress Notes (Signed)
[facility] Counselor/Therapist Progress Note  Patient ID: Andrea Ibarra, MRN: [id number],    Date: 07/22/2020  Time Spent: 50 minutes  3:00pm to 3:50pm  Treatment Type: Individual Therapy  Reported Symptoms: anxiety, stressed (at times, but managing it some better), depression but "doesn't usually last long/not more that a few days at the most"   Mental Status Exam:  Appearance:   Casual     Behavior:  Appropriate, Sharing and Motivated  Motor:  a little slowed, using cane at times  Speech/Language:   Clear and Coherent  Affect:  anxious  Mood:  anxious  Thought process:  normal  Thought content:    WNL  Sensory/Perceptual disturbances:    WNL  Orientation:  oriented to person, place, time/date, situation, day of week, month of year and year  Attention:  Good  Concentration:  Good and Fair  Memory:  some forgetting and worse under stress  Fund of knowledge:   Good  Insight:    Good and Fair  Judgment:   Good  Impulse Control:  Good   Risk Assessment: Danger to Self:  No Self-injurious Behavior: No Danger to Others: No Duty to Warn:no Physical Aggression / Violence:No  Access to Firearms a concern: No  Gang Involvement:No   Subjective: Patient today reports anxiety and some depression.  "Not so much depression right now and I usually come out of it.  "Worries a lot."  Interventions: Solution-Oriented/Positive Psychology and Ego-Supportive  Diagnosis:   ICD-10-CM   1. Generalized anxiety disorder  F41.1     Plan: Patient not signing tx plan on computer screen due to [place].  Treatment Goals: Goals may remain on tx plan as patient works on strategies to meet her goals. Progress will be documented each session in the "Progress" section of Plan.  Long term goal: Reduce overall level, frequency, and intensity of the anxiety so that daily functioning is not impaired.  Short term goal: Increase understanding of beliefs and messages that lead to worry  and anxiety. Patient will share her understanding in written form or verbally in sessions, or both.   Strategy: Identify, challenge, and replace anxious or negative self-talk with positive, realistic, and empowering self-talk.  Progress:  Patient in today stating symptoms of anxiety and depression, but states depression is occasional and situational.  Does worry a lot about family and health concerns, and some recent deaths of friends, one who was a very close friend for years. Very concerned and stressed re: Covid but also  seeing that some things are improving. Vented her concerns in session today and seemed to feel more calm and grounded. Worked with her short term goal re: frequent recurrent anxious thoughts and self-talk, using specific examples of thoughts that occur often and feed her anxiety. She did well with this in office today and shared that it is much harder on her own, understandably.  She has a good attitude and good motivation and has more success than she used to in managing her anxiety.  Encouraged good self-care including staying in contact with supportive family and friends, trying to stay in the present versus the future or the past, focusing on what she can impact her control versus what she cannot, healthy nutrition and some physical moving about as she is able, and continuing to try and interrupt anxious thoughts and self-talk and then replace them with more reality based and positive thoughts and self-talk that does not lead to anxiety nor feeling more stressed.  Goal review and progress/challenges noted with patient.  Next appointment within approximately 1 month.   Shanon Ace, LCSW

## 2020-08-09 DIAGNOSIS — I252 Old myocardial infarction: Secondary | ICD-10-CM | POA: Diagnosis not present

## 2020-08-09 DIAGNOSIS — I2510 Atherosclerotic heart disease of native coronary artery without angina pectoris: Secondary | ICD-10-CM | POA: Diagnosis not present

## 2020-08-09 DIAGNOSIS — E7800 Pure hypercholesterolemia, unspecified: Secondary | ICD-10-CM | POA: Diagnosis not present

## 2020-08-20 DIAGNOSIS — L821 Other seborrheic keratosis: Secondary | ICD-10-CM | POA: Diagnosis not present

## 2020-08-20 DIAGNOSIS — D225 Melanocytic nevi of trunk: Secondary | ICD-10-CM | POA: Diagnosis not present

## 2020-08-20 DIAGNOSIS — L853 Xerosis cutis: Secondary | ICD-10-CM | POA: Diagnosis not present

## 2020-08-20 DIAGNOSIS — D1801 Hemangioma of skin and subcutaneous tissue: Secondary | ICD-10-CM | POA: Diagnosis not present

## 2020-08-20 DIAGNOSIS — L819 Disorder of pigmentation, unspecified: Secondary | ICD-10-CM | POA: Diagnosis not present

## 2020-08-20 DIAGNOSIS — D2271 Melanocytic nevi of right lower limb, including hip: Secondary | ICD-10-CM | POA: Diagnosis not present

## 2020-08-23 DIAGNOSIS — M5416 Radiculopathy, lumbar region: Secondary | ICD-10-CM | POA: Diagnosis not present

## 2020-08-24 DIAGNOSIS — G4709 Other insomnia: Secondary | ICD-10-CM | POA: Diagnosis not present

## 2020-08-24 DIAGNOSIS — M722 Plantar fascial fibromatosis: Secondary | ICD-10-CM | POA: Diagnosis not present

## 2020-08-24 DIAGNOSIS — Z6830 Body mass index (BMI) 30.0-30.9, adult: Secondary | ICD-10-CM | POA: Diagnosis not present

## 2020-08-24 DIAGNOSIS — E7800 Pure hypercholesterolemia, unspecified: Secondary | ICD-10-CM | POA: Diagnosis not present

## 2020-08-24 DIAGNOSIS — E669 Obesity, unspecified: Secondary | ICD-10-CM | POA: Diagnosis not present

## 2020-08-24 DIAGNOSIS — M0589 Other rheumatoid arthritis with rheumatoid factor of multiple sites: Secondary | ICD-10-CM | POA: Diagnosis not present

## 2020-08-24 DIAGNOSIS — M5136 Other intervertebral disc degeneration, lumbar region: Secondary | ICD-10-CM | POA: Diagnosis not present

## 2020-08-24 DIAGNOSIS — R600 Localized edema: Secondary | ICD-10-CM | POA: Diagnosis not present

## 2020-08-24 DIAGNOSIS — M25551 Pain in right hip: Secondary | ICD-10-CM | POA: Diagnosis not present

## 2020-08-24 DIAGNOSIS — M150 Primary generalized (osteo)arthritis: Secondary | ICD-10-CM | POA: Diagnosis not present

## 2020-08-26 ENCOUNTER — Other Ambulatory Visit: Payer: Self-pay

## 2020-08-26 ENCOUNTER — Ambulatory Visit (INDEPENDENT_AMBULATORY_CARE_PROVIDER_SITE_OTHER): Payer: PPO | Admitting: Psychiatry

## 2020-08-26 DIAGNOSIS — F411 Generalized anxiety disorder: Secondary | ICD-10-CM

## 2020-08-26 NOTE — Progress Notes (Signed)
[place]       [facility] Counselor/Therapist Progress Note  Patient ID: Andrea Ibarra, MRN: [id number],    Date: 08/26/2020  Time Spent: 60 minutes  3:00pm to 4:00pm  Treatment Type: Individual Therapy  Reported Symptoms: anxiety, some lingering grief over death of close long-time friend recently, "worries about my kids and grandkids for specific health reasons"  Mental Status Exam:  Appearance:   Casual     Behavior:  Appropriate, Sharing and Motivated  Motor:  walks with cane  Speech/Language:   Clear and Coherent  Affect:  anxious  Mood:  anxious  Thought process:  normal  Thought content:    some obsessiveness at times  Sensory/Perceptual disturbances:    WNL  Orientation:  oriented to person, place, time/date, situation, day of week, month of year and year  Attention:  Good  Concentration:  Good and Fair  Memory:  some forgetting and states her Dr is aware  Fund of knowledge:   Good  Insight:    Good and Fair  Judgment:   Good  Impulse Control:  Good   Risk Assessment: Danger to Self:  No Self-injurious Behavior: No Danger to Others: No Duty to Warn:no Physical Aggression / Violence:No  Access to Firearms a concern: No  Gang Involvement:No   Subjective: Patient today reports anxiety and some residual grief issues over close friend's death recently.   Interventions: Solution-Oriented/Positive Psychology and Ego-Supportive  Diagnosis:   ICD-10-CM   1. Generalized anxiety disorder  F41.1     Plan: Patient not signing tx plan on computer screen due to [place].  Treatment Goals: Goals may remain on tx plan as patient works on strategies to meet her goals. Progress will be documented each session in the "Progress" section of Plan.  Long term goal: Reduce overall level, frequency, and intensity of the anxiety so that daily functioning is not impaired.  Short term goal: Increase understanding of beliefs and messages that lead to worry and  anxiety. Patient will share her understanding in written form or verbally in sessions, or both.   Strategy: Identify, challenge, and replace anxious or negative self-talk with positive, realistic, and empowering self-talk.  Progress: Patient in today reporting anxiety and grief over loss of long time friend recently, and holidays can be a rougher time emotionally.  Processed a lot of concerns about herself and her family, including health issues and communication challenges.  Remains concerned about COVID but not quite as stressed with it except with the new variant.  Has gotten better about not focusing and so heavily on distressing news online or on TV.  Still having frequent anxious thoughts and self talk although not as extreme as she used to have.  Reviewed today her short-term goal and strategy as noted above in treatment plan and encouraged her to follow-up in using these on a frequent basis for now.  Shared with her some specific examples of anxious thoughts that seem to be recurring that would be good for her to work on between sessions.  Patient continues to demonstrate good motivation and has a good attitude.  Encouraged patient to stay connected with supportive friends and family, to practice more positive self talk that is reality based and encouraging, stay in the present versus jumping into the future and worrying more, the nutrition and some physical moving as she is able, focusing on what she can control versus not control, and setting healthy boundaries as needed with others.  Goal review and progress/challenges noted  with patient.  Next appointment within 3 to 4 weeks.   Shanon Ace, LCSW

## 2020-09-01 DIAGNOSIS — E7800 Pure hypercholesterolemia, unspecified: Secondary | ICD-10-CM | POA: Diagnosis not present

## 2020-09-06 DIAGNOSIS — E7800 Pure hypercholesterolemia, unspecified: Secondary | ICD-10-CM | POA: Diagnosis not present

## 2020-09-06 DIAGNOSIS — I10 Essential (primary) hypertension: Secondary | ICD-10-CM | POA: Diagnosis not present

## 2020-09-30 ENCOUNTER — Other Ambulatory Visit: Payer: Self-pay

## 2020-09-30 ENCOUNTER — Ambulatory Visit (INDEPENDENT_AMBULATORY_CARE_PROVIDER_SITE_OTHER): Payer: PPO | Admitting: Psychiatry

## 2020-09-30 DIAGNOSIS — F411 Generalized anxiety disorder: Secondary | ICD-10-CM
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
## 2020-11-02 ENCOUNTER — Other Ambulatory Visit: Payer: Self-pay

## 2020-11-02 ENCOUNTER — Ambulatory Visit (INDEPENDENT_AMBULATORY_CARE_PROVIDER_SITE_OTHER): Payer: PPO | Admitting: Psychiatry

## 2020-11-02 DIAGNOSIS — F411 Generalized anxiety disorder: Secondary | ICD-10-CM | POA: Diagnosis not present

## 2020-11-02 NOTE — Progress Notes (Signed)
[facility] Counselor/Therapist Progress Note  Patient ID: Andrea Ibarra, MRN: [id number],    Date: 11/02/2020  Time Spent: 60 minutes    3:00pm to 4:00pm  Treatment Type: Individual Therapy  Reported Symptoms: Anxiety, "worrying a lot" about Covid but more so about her son and health issue  Mental Status Exam:  Appearance:   Casual     Behavior:  Appropriate, Sharing and Motivated  Motor:  Normal  Speech/Language:   Clear and Coherent  Affect:  anxious  Mood:  anxious  Thought process:  goal directed  Thought content:    obsessiveness  Sensory/Perceptual disturbances:    WNL  Orientation:  oriented to person, place, time/date, situation, day of week, month of year and year  Attention:  Good  Concentration:  Fair  Memory:  some forgetfulness and Dr is aware  Fund of knowledge:   Good  Insight:    Good  Judgment:   Good  Impulse Control:  Good   Risk Assessment: Danger to Self:  No Self-injurious Behavior: No Danger to Others: No Duty to Warn:no Physical Aggression / Violence:No  Access to Firearms a concern: No  Gang Involvement:No   Subjective: Patient reporting anxiety mostly in reference to Covid, health issues, and son's health concerns.  Interventions: Solution-Oriented/Positive Psychology and Ego-Supportive  Diagnosis:   ICD-10-CM   1. Generalized anxiety disorder  F41.1      Plan: Patient not signing tx plan on computer screen due to [place].  Treatment Goals: Goals may remain on tx plan as patient works on strategies to meet her goals. Progress will be documented each session in the "Progress" section of Plan.  Long term goal: Reduce overall level, frequency, and intensity of the anxiety so that daily functioning is not impaired.  Short term goal: Increase understanding of beliefs and messages that lead to worry and anxiety. Patient will share her understanding in written form or verbally in sessions, or both.   Strategy: Identify,  challenge, and replace anxious or negative self-talk with positive, realistic, and empowering self-talk.  Progress: Patient today reporting anxiety, and concerns re: her son's health concerns, situations within family.  Talks at length about her son's health concerns and her anxieties and fears about them.  Seeming more anxious today than last appointment and we worked today very specifically with her long and short-term goals and strategy in her treatment plan above.  Focus was to better identify anxious/fearful thoughts, be able to challenge them, and then replace them with more reality based and positive thoughts.  Practiced this in session today and shared with her how she can apply this more in daily life and especially in family situations.  Still has some reservations and worries about COVID and was able to talk through them very openly in session today and seemed to feel calmer and less fearful with those thoughts as well.  Discussed her tendency to worry a lot and how that really does not help her even though she has felt at times it helped her be prepared "in case something bad happens".  Shared how being so on guard and worried actually depletes her energy rather than helps prepare her.  Talked about ways of dealing better with anxiety and not tending to assume the worst in situations as that escalates her fears.  The more we spoke about this, she did seem to understand, and is to work on this between sessions.  Encouraged patient to remain actively involved with friends both at  her retirement center and though she has in the community, to practice consistently interrupting her anxious and worrisome thoughts and try to replace them with more positive and reality based thoughts, walking some each day as she is able in the hallways of her retirement center, staying in the present and focusing on what she can control, setting appropriate boundaries with other people as needed, and staying in touch with  other people who are supportive of her.  Goal review and progress/challenges noted with patient.  Next appointment within 3 to 4 weeks.   Shanon Ace, LCSW

## 2020-11-18 DIAGNOSIS — L602 Onychogryphosis: Secondary | ICD-10-CM | POA: Diagnosis not present

## 2020-11-22 DIAGNOSIS — M0589 Other rheumatoid arthritis with rheumatoid factor of multiple sites: Secondary | ICD-10-CM | POA: Diagnosis not present

## 2020-11-30 ENCOUNTER — Ambulatory Visit (INDEPENDENT_AMBULATORY_CARE_PROVIDER_SITE_OTHER): Payer: PPO | Admitting: Psychiatry

## 2020-11-30 ENCOUNTER — Other Ambulatory Visit: Payer: Self-pay

## 2020-11-30 DIAGNOSIS — F411 Generalized anxiety disorder: Secondary | ICD-10-CM

## 2020-11-30 NOTE — Progress Notes (Signed)
      [facility] Counselor/Therapist Progress Note  Patient ID: Andrea Ibarra, MRN: [id number],    Date: 11/30/2020  Time Spent: 60 minutes   3:00pm to 4:00pm  Treatment Type: Individual Therapy  Reported Symptoms: anxiety, "worries"  Mental Status Exam:  Appearance:   Neat     Behavior:  Appropriate, Sharing and Motivated  Motor:  Walks with cane  Speech/Language:   Clear and Coherent  Affect:  anxious  Mood:  anxious  Thought process:  normal  Thought content:    some obsessiveness  Sensory/Perceptual disturbances:    WNL  Orientation:  oriented to person, place, time/date, situation, day of week, month of year and year  Attention:  Good  Concentration:  Good and Fair  Memory:  some forgetfulness and she reports Dr is aware  Fund of knowledge:   Good  Insight:    Good  Judgment:   Good  Impulse Control:  Good   Risk Assessment: Danger to Self:  No Self-injurious Behavior: No Danger to Others: No Duty to Warn:no Physical Aggression / Violence:No  Access to Firearms a concern: No  Gang Involvement:No   Subjective:  Patient today reporting anxiety and "worries".  Interventions: Cognitive Behavioral Therapy, Solution-Oriented/Positive Psychology and Ego-Supportive  Diagnosis:   ICD-10-CM   1. Generalized anxiety disorder  F41.1      Plan: Patient not signing tx plan on computer screen due to [place].  Treatment Goals: Goals may remain on tx plan as patient works on strategies to meet her goals. Progress will be documented each session in the "Progress" section of Plan.  Long term goal: Reduce overall level, frequency, and intensity of the anxiety so that daily functioning is not impaired.  Short term goal: Increase understanding of beliefs and messages that lead to worry and anxiety. Patient will share her understanding in written form or verbally in sessions, or both.   Strategy: Identify, challenge, and replace anxious or negative self-talk with  positive, realistic, and empowering self-talk.  Progress: Patient in today reporting anxiety and "worries" ( family, health, future). Tendency to fear the worst, "but trying to hope for better".  Admits she is a "new junkie and does watch world violence on TV". Encouraged her to limit her watching of those news programs as that feeds her anxiety, worries, and stress. Relieved that son's health situation has resolved.  Fearful thoughts have decreased some, but more general anxious thoughts continue "although not as bad." Worked more today on identifying the more frequent anxious thoughts, interrupting and challenging them, and replacing them with more positive and empowering thoughts that do not feed anxiety.  Did encourage patient to remain connected to her supportive friends, practice positive self talk, look intentionally for positives daily, getting outside some daily as weather permits, limiting her consumption of news that involves world violence, walking some in the hallways of her retirement center as she is able, set appropriate boundaries with others as needed, staying in the present focusing on what she can control, and following through from a work in session today on interrupting her anxious thoughts and replace them with more positive and reality based thoughts that are empowering and do not support anxiety.   Goal review and progress/challenges noted with patient.  Next appointment within 4 weeks.  Shanon Ace, LCSW

## 2020-12-16 DIAGNOSIS — I10 Essential (primary) hypertension: Secondary | ICD-10-CM | POA: Diagnosis not present

## 2020-12-16 DIAGNOSIS — E7800 Pure hypercholesterolemia, unspecified: Secondary | ICD-10-CM | POA: Diagnosis not present

## 2020-12-16 DIAGNOSIS — M8580 Other specified disorders of bone density and structure, unspecified site: Secondary | ICD-10-CM | POA: Diagnosis not present

## 2020-12-20 DIAGNOSIS — G894 Chronic pain syndrome: Secondary | ICD-10-CM | POA: Diagnosis not present

## 2020-12-20 DIAGNOSIS — M5136 Other intervertebral disc degeneration, lumbar region: Secondary | ICD-10-CM | POA: Diagnosis not present

## 2020-12-20 DIAGNOSIS — M5416 Radiculopathy, lumbar region: Secondary | ICD-10-CM | POA: Diagnosis not present

## 2021-01-05 ENCOUNTER — Ambulatory Visit: Payer: PPO | Admitting: Psychiatry

## 2021-01-05 DIAGNOSIS — H5010 Unspecified exotropia: Secondary | ICD-10-CM | POA: Diagnosis not present

## 2021-01-05 DIAGNOSIS — H52203 Unspecified astigmatism, bilateral: Secondary | ICD-10-CM | POA: Diagnosis not present

## 2021-01-05 DIAGNOSIS — Z961 Presence of intraocular lens: Secondary | ICD-10-CM | POA: Diagnosis not present

## 2021-01-06 ENCOUNTER — Ambulatory Visit: Payer: PPO | Admitting: Psychiatry

## 2021-01-15 DIAGNOSIS — M8580 Other specified disorders of bone density and structure, unspecified site: Secondary | ICD-10-CM | POA: Diagnosis not present

## 2021-01-15 DIAGNOSIS — E7800 Pure hypercholesterolemia, unspecified: Secondary | ICD-10-CM | POA: Diagnosis not present

## 2021-01-15 DIAGNOSIS — I10 Essential (primary) hypertension: Secondary | ICD-10-CM | POA: Diagnosis not present

## 2021-01-25 ENCOUNTER — Other Ambulatory Visit: Payer: Self-pay

## 2021-01-25 ENCOUNTER — Ambulatory Visit (INDEPENDENT_AMBULATORY_CARE_PROVIDER_SITE_OTHER): Payer: PPO | Admitting: Psychiatry

## 2021-01-25 DIAGNOSIS — F411 Generalized anxiety disorder: Secondary | ICD-10-CM

## 2021-01-25 NOTE — Progress Notes (Signed)
[facility] Counselor/Therapist Progress Note  Patient ID: Andrea Ibarra, MRN: [id number],    Date: 01/25/2021  Time Spent: 60 minutes    3:00pm to 4:00pm  Treatment Type: Individual Therapy  Reported Symptoms: anxiety  Mental Status Exam:  Appearance:   Well Groomed     Behavior:  Appropriate, Sharing and Motivated  Motor:  Normal  Speech/Language:   Clear and Coherent  Affect:  anxious  Mood:  anxious  Thought process:  goal directed  Thought content:    some ruminating  Sensory/Perceptual disturbances:    WNL  Orientation:  oriented to person, place, time/date, situation, day of week, month of year and year  Attention:  Good  Concentration:  Good and Fair  Memory:  some forgetting "worse under stress"  Fund of knowledge:   Good  Insight:    Good  Judgment:   Good  Impulse Control:  Good   Risk Assessment: Danger to Self:  No Self-injurious Behavior: No Danger to Others: No Duty to Warn:no Physical Aggression / Violence:No  Access to Firearms a concern: No  Gang Involvement:No   Subjective: Patient in today reporting anxiety and "worries a lot". See Progress Note below.  Interventions: Solution-Oriented/Positive Psychology and Ego-Supportive  Diagnosis:   ICD-10-CM   1. Generalized anxiety disorder  F41.1     Plan: Patient not signing tx plan on computer screen due to [place].  Treatment Goals: Goals may remain on tx plan as patient works on strategies to meet her goals. Progress will be documented each session in the "Progress" section of Plan.  Long term goal: Reduce overall level, frequency, and intensity of the anxiety so that daily functioning is not impaired.  Short term goal: Increase understanding of beliefs and messages that lead to worry and anxiety. Patient will share her understanding in written form or verbally in sessions, or both.   Strategy: Identify, challenge, and replace anxious or negative self-talk with positive,  realistic, and empowering self-talk.  Progress: Patient in today reporting anxiety and "worries a lot" mostly related to family and health concerns. Vented her concerns about family situations that she worries about and just the venting seemed to help her as she really doesn't have many people that she openly speak with and say "whatever I need to say" and get feedback that can help her better manage stress and anxiety. Using her short term goal, worked with patient today using some specific examples from what she shared, and trying to better understand the beliefs and messages that most lead her to increase worry and anxiety.  In identifying specific examples and better understand the type of beliefs and thoughts that lead to increased worry and anxiety,, we were able to move forward (per strategy in treatment plan) and replace the anxiety provoking thoughts with more calming, positive, and realistic/empowering thoughts.  Patient notes that this is easier when we work on this together in person then when she needs to do it on her own.  Encouraged her that with practice this can get easier for her even when she is alone.  Still working on "trying not to assume worst case scenarios".  Encouraged her to keep working on the thought stopping and replacing with more positive/empowering thoughts while at home, to stay in touch with people who are supportive of her, look intentionally for positives each day, get outside some each day as she is able, limit her consumption of world news especially limiting reports about world violence as that  has proven to not help patient, set appropriate boundaries with others as needed, staying in the present focusing on what she can control, getting some exercise by walking in the hallways at her retirement center, and to feel good about the efforts she puts into goal-directed behaviors as she tries to acquire better skills in managing and reducing her anxiety.  Showed good  motivation today.   Goal review and progress/challenges noted with patient.  Next appt within 4 weeks.   Shanon Ace, LCSW

## 2021-02-17 DIAGNOSIS — Z96651 Presence of right artificial knee joint: Secondary | ICD-10-CM | POA: Diagnosis not present

## 2021-02-22 DIAGNOSIS — G4709 Other insomnia: Secondary | ICD-10-CM | POA: Diagnosis not present

## 2021-02-22 DIAGNOSIS — M722 Plantar fascial fibromatosis: Secondary | ICD-10-CM | POA: Diagnosis not present

## 2021-02-22 DIAGNOSIS — M0589 Other rheumatoid arthritis with rheumatoid factor of multiple sites: Secondary | ICD-10-CM | POA: Diagnosis not present

## 2021-02-22 DIAGNOSIS — M150 Primary generalized (osteo)arthritis: Secondary | ICD-10-CM | POA: Diagnosis not present

## 2021-02-22 DIAGNOSIS — R600 Localized edema: Secondary | ICD-10-CM | POA: Diagnosis not present

## 2021-02-22 DIAGNOSIS — E669 Obesity, unspecified: Secondary | ICD-10-CM | POA: Diagnosis not present

## 2021-02-22 DIAGNOSIS — Z6830 Body mass index (BMI) 30.0-30.9, adult: Secondary | ICD-10-CM | POA: Diagnosis not present

## 2021-02-22 DIAGNOSIS — M25551 Pain in right hip: Secondary | ICD-10-CM | POA: Diagnosis not present

## 2021-02-22 DIAGNOSIS — M5136 Other intervertebral disc degeneration, lumbar region: Secondary | ICD-10-CM | POA: Diagnosis not present

## 2021-03-03 ENCOUNTER — Ambulatory Visit: Payer: PPO | Admitting: Psychiatry

## 2021-03-03 NOTE — Progress Notes (Unsigned)
      [facility] Counselor/Therapist Progress Note  Patient ID: Andrea Ibarra, MRN: [id number],    Date: 03/03/2021  Time Spent:   Treatment Type: {[facility] AMB THERAPY [phone]}  Reported Symptoms:   Mental Status Exam:  Appearance:   {PSY:22683}     Behavior:  {PSY:21022743}  Motor:  {PSY:22302}  Speech/Language:   {PSY:22685}  Affect:  {PSY:22687}  Mood:  {PSY:31886}  Thought process:  {PSY:31888}  Thought content:    {[phone]}  Sensory/Perceptual disturbances:    {[phone]}  Orientation:  {PSY:30297}  Attention:  {PSY:22877}  Concentration:  {[phone]}  Memory:  {PSY:[phone]}  Fund of knowledge:   {[phone]}  Insight:    {[phone]}  Judgment:   {[phone]}  Impulse Control:  {[phone]}   Risk Assessment: Danger to Self:  {PSY:22692} Self-injurious Behavior: {PSY:22692} Danger to Others: {PSY:22692} Duty to Warn:{PSY:311194} Physical Aggression / Violence:{PSY:21197} Access to Firearms a concern: {PSY:21197} Gang Involvement:{PSY:21197}  Subjective:        Interventions: {[phone]}  Diagnosis:No diagnosis found.  Plan:        Shanon Ace, LCSW

## 2021-03-07 ENCOUNTER — Other Ambulatory Visit: Payer: Self-pay | Admitting: Cardiovascular Disease

## 2021-03-14 DIAGNOSIS — M8580 Other specified disorders of bone density and structure, unspecified site: Secondary | ICD-10-CM | POA: Diagnosis not present

## 2021-03-14 DIAGNOSIS — E7800 Pure hypercholesterolemia, unspecified: Secondary | ICD-10-CM | POA: Diagnosis not present

## 2021-03-14 DIAGNOSIS — I10 Essential (primary) hypertension: Secondary | ICD-10-CM | POA: Diagnosis not present

## 2021-03-14 DIAGNOSIS — Z0000 Encounter for general adult medical examination without abnormal findings: Secondary | ICD-10-CM | POA: Diagnosis not present

## 2021-03-14 DIAGNOSIS — E559 Vitamin D deficiency, unspecified: Secondary | ICD-10-CM | POA: Diagnosis not present

## 2021-03-17 DIAGNOSIS — E7800 Pure hypercholesterolemia, unspecified: Secondary | ICD-10-CM | POA: Diagnosis not present

## 2021-03-17 DIAGNOSIS — M8580 Other specified disorders of bone density and structure, unspecified site: Secondary | ICD-10-CM | POA: Diagnosis not present

## 2021-03-17 DIAGNOSIS — I10 Essential (primary) hypertension: Secondary | ICD-10-CM | POA: Diagnosis not present

## 2021-03-22 DIAGNOSIS — M5136 Other intervertebral disc degeneration, lumbar region: Secondary | ICD-10-CM | POA: Diagnosis not present

## 2021-03-22 DIAGNOSIS — M5416 Radiculopathy, lumbar region: Secondary | ICD-10-CM | POA: Diagnosis not present

## 2021-03-29 ENCOUNTER — Other Ambulatory Visit: Payer: Self-pay

## 2021-03-29 ENCOUNTER — Ambulatory Visit (INDEPENDENT_AMBULATORY_CARE_PROVIDER_SITE_OTHER): Payer: PPO | Admitting: Psychiatry

## 2021-03-29 DIAGNOSIS — F411 Generalized anxiety disorder: Secondary | ICD-10-CM

## 2021-03-29 NOTE — Progress Notes (Signed)
[facility] Counselor/Therapist Progress Note  Patient ID: Andrea Ibarra, MRN: [id number],    Date: 03/29/2021  Time Spent: 60 minutes   Treatment Type: Individual Therapy  Reported Symptoms: anxiety  Mental Status Exam:  Appearance:   Casual and Neat     Behavior:  Appropriate, Sharing, and Motivated  Motor:  Uses cane to walk  Speech/Language:   Clear and Coherent and Normal Rate  Affect:  Anxiety, worries  Mood:  anxious  Thought process:  goal directed  Thought content:    Obsessions  Sensory/Perceptual disturbances:    WNL  Orientation:  oriented to person, place, time/date, situation, day of week, month of year, year, and stated date of March 29, 2021  Attention:  Good  Concentration:  Fair  Memory:  Some forgetting  Fund of knowledge:   Good  Insight:    Good and Fair  Judgment:   Good and Fair  Impulse Control:  Good and Fair   Risk Assessment: Danger to Self:  No Self-injurious Behavior: No Danger to Others: No Duty to Warn:no Physical Aggression / Violence:No  Access to Firearms a concern: No  Gang Involvement:No   Subjective:  Patient in today reporting lots of anxiety and worries that relate to her personally, health concerns, and family concerns.  Denies any SI.  Shares that her anxiety and worry have been worse lately and she did a good job today explaining her "worries" and anxieties and how "Covid, the War, and all that's going on in the world have made my anxieties and worries worse."  Processed her personal and family concerns today, and also her health and aging concerns. She reports and it does seem to help her to vent her concerns and discussed them, feeling heard, understood, but also redirected to some positive coping skills versus excessive worrying.  Understandably it is difficult for her to always think about this in the moment when something happens that triggers her worrying.  Was able to realize some of her "over-worrying" and the effect it  has on her emotionally. Looked at ways she can practice more "staying in the moment" versus jumping ahead and imaging worse case scenarios.  Encouraged her to allow for good sleep pattern, walking inside of her retirement center building for some exercise, healthy nutrition, and staying in regular contact with supportive friends and her church friends as she participates there regularly.  Reminded her and reviewed some of the work we did in a prior session using her short-term goal in helping her to better understand the beliefs and messages that typically lead to her increased worry and anxiety, and how those beliefs and messages are not grounded in reality.  Was calmer and more resilient by the end of session today.  Interventions: Cognitive Behavioral Therapy and Solution-Oriented/Positive Psychology  Diagnosis:   ICD-10-CM   1. Generalized anxiety disorder  F41.1         Plan:   Patient not signing tx plan on computer screen due to [place]. Treatment Goals: Goals may remain on tx plan as patient works on strategies to meet her goals.  Progress will be documented each session in the "Progress" section of Plan. Long term goal: Reduce overall level, frequency, and intensity of the anxiety so that daily functioning is not impaired.  Short term goal: Increase understanding of beliefs and messages that lead to worry and anxiety.  Patient will share her understanding in written form or verbally in sessions, or both. Strategy: Identify, challenge,  and replace anxious or negative self-talk with positive, realistic, and empowering self-talk.    Plan:  Patient today showing good motivation and participated actively in session today working on her tendency to worry excessively and have difficulty managing her anxiety.  Trying to work harder on not assuming worst case scenarios and believing in herself more.  Encouraged patient to continue some of the behaviors that have proven to be helpful for her  previously between sessions including: Working on recognizing her anxious thoughts sooner and trying the thought stopping technique, replacing with more positive/empowering thoughts, staying in touch with people who are supportive of her, intentionally looking for more positives than negatives every day, get outside some each day as she is able due to some physical limitations, limit her consumption of world news especially limiting reports about world violence as that has proven not to help patient, set appropriate boundaries with others as needed, stay in the present focusing on what she can control, get outside some daily, do some walking in the hallways of her retirement center especially with other friends there, and feel good about the efforts she puts into goal-directed behaviors in trying to develop better skills in managing and reducing her anxiety more effectively.  Goal review and progress/challenges noted with patient.  Next appointment within 3 to 4 weeks.   Shanon Ace, LCSW

## 2021-04-01 ENCOUNTER — Ambulatory Visit: Payer: PPO | Admitting: Psychiatry

## 2021-04-05 ENCOUNTER — Other Ambulatory Visit: Payer: Self-pay | Admitting: Cardiovascular Disease

## 2021-04-12 ENCOUNTER — Telehealth: Payer: Self-pay | Admitting: Cardiovascular Disease

## 2021-04-12 ENCOUNTER — Other Ambulatory Visit: Payer: Self-pay

## 2021-04-12 MED ORDER — LOSARTAN POTASSIUM 25 MG PO TABS: 25.0000 mg | ORAL_TABLET | Freq: Every day | ORAL | 4 refills | Status: DC

## 2021-04-12 NOTE — Telephone Encounter (Signed)
*  STAT* If patient is at the pharmacy, call can be transferred to refill team.   1. Which medications need to be refilled? (please list name of each medication and dose if known)  losartan (COZAAR) 25 MG tablet  2. Which pharmacy/location (including street and city if local pharmacy) is medication to be sent to? [id number]- [place], [place] - [address]   3. Do they need a 30 day or 90 day supply? Needs enough medication to last until 01/17 appt. Only has 2 week supply left.

## 2021-05-09 ENCOUNTER — Other Ambulatory Visit: Payer: Self-pay

## 2021-05-09 ENCOUNTER — Ambulatory Visit: Payer: PPO | Admitting: Psychiatry

## 2021-05-09 DIAGNOSIS — F411 Generalized anxiety disorder: Secondary | ICD-10-CM | POA: Diagnosis not present

## 2021-05-09 NOTE — Progress Notes (Signed)
[facility] Counselor/Therapist Progress Note  Patient ID: Andrea Ibarra, MRN: [id number],    Date: 05/09/2021  Time Spent: 58 minutes   Treatment Type: Individual Therapy  Reported Symptoms: anxiety, concerned about her adult children,   Mental Status Exam:  Appearance:   Casual     Behavior:  Appropriate, Sharing, and Motivated  Motor:  Normal  Speech/Language:   Clear and Coherent  Affect:  anxious  Mood:  anxious  Thought process:  goal directed  Thought content:    Some obsessiveness, some rumination  Sensory/Perceptual disturbances:    WNL  Orientation:  oriented to person, place, time/date, situation, day of week, month of year, year, and stated date of Aug. 22, 2022  Attention:  Good  Concentration:  Good and Fair  Memory:  Some forgetting, mostly short term memory  Fund of knowledge:   Good  Insight:    Good and Fair  Judgment:   Good  Impulse Control:  Good   Risk Assessment: Danger to Self:  No Self-injurious Behavior: No Danger to Others: No Duty to Warn:no Physical Aggression / Violence:No  Access to Firearms a concern: No  Gang Involvement:No   Subjective: Patient in today reporting significant anxiety due to family issues/financial concerns/health concerns of her and family, some "worries" (but has "improved some"), and easy to "over-stress". No SI. Expresses her anxiety very openly today, and also worked on anxious thought recognition,  recognizing them and trying to calm herself more rather than let the thoughts spiral.  Is better at recognizing the thoughts and will sometimes "think them through more" and realize they are not based on reality.  "Other times I do not recognize them until I become very anxious".  Acknowledges there is some family history to anxiety with others having experienced it.  Anxiety regarding COVID has decreased.  States she does "tend to feel better when she can calm and express herself openly, especially about her anxieties,  without having to worry about anybody else."  We will discuss positive coping skills but she admits it is difficult for her sometimes to "think in the moment" when her anxiety is triggered.  Does try to keep it from turning into "worry" cycles, and we noted the difference again today between worrying, and being anxious and working through it.  One thing noticed today is that she does not seem to get stuck as much on things that concern her and stay stuck, according to how she is in sessions and also based on her report outside of sessions.  When she can "stay in the moment" as we worked on before, she does better and has less anxiety.  Her tendency sometimes has been to jump ahead and more than imagine the worst case scenario is happening however per her report, that has decreased some.  Encouraged patient to walk some inside of her retirement center building for some exercise and where she can go at her own pace, healthy nutrition and staying in regular contact with supportive friends and her church friends as she participates there regularly.  Also encouraged healthy sleep patterns on a regular basis.  Reviewed some of the prior work we have done on patient better understanding the beliefs and messages that usually lead to her increased worry and anxiety, and how those beliefs and messages are not grounded in reality.  Admits that she needs to remind herself of this more often.  States that she values coming for her sessions because she really  does not have other places to speak confidentially with someone especially about the things that concern her the most involving family issues.  Was definitely grounded and less anxious upon leaving session today.   Interventions: Cognitive Behavioral Therapy, Solution-Oriented/Positive Psychology, and Ego-Supportive  Diagnosis:   ICD-10-CM   1. Generalized anxiety disorder  F41.1       Treatment goal plan: Patient not signing tx plan on computer screen due to  [place]. Treatment Goals: Goals may remain on tx plan as patient works on strategies to meet her goals.  Progress will be documented each session in the "Progress" section of Plan. Long term goal: Reduce overall level, frequency, and intensity of the anxiety so that daily functioning is not impaired.  Short term goal: Increase understanding of beliefs and messages that lead to worry and anxiety.  Patient will share her understanding in written form or verbally in sessions, or both. Strategy: Identify, challenge, and replace anxious or negative self-talk with positive, realistic, and empowering self-talk.     Plan: Patient today showing good motivation and very actively participated in session focusing on anxiety reduction in better managing her anxious thoughts more effectively but patient is finding some strategies difficult for her and acknowledges some of her difficulties may be "because I'm so old". Looked at some ways to increase her recognition of her anxious thoughts more quickly.  Encouraged patient in practicing some behaviors that have been helpful to her previously including: Staying in touch with people who are supportive, limit her consumption of world news especially when it is distressing, replacing anxious thoughts with more realistic thoughts, intentionally looking for more positives and negatives every day, get outside some each day or walk in her building at her retirement center as she is able, look for the positives within herself and the strength within herself, set appropriate boundaries with others as needed, stay in the present focusing on what she can control, interact with others frequently, and feel good about the strength she shows in working with goal-directed behaviors as she tries to develop better coping skills especially in managing/reducing her anxiety and also to move in a more positive direction towards improved overall emotional health.  Review and  progress/challenges noted with patient.  Next appointment within approximately 4 weeks.   Shanon Ace, LCSW

## 2021-05-11 DIAGNOSIS — J45991 Cough variant asthma: Secondary | ICD-10-CM | POA: Diagnosis not present

## 2021-05-11 DIAGNOSIS — J3089 Other allergic rhinitis: Secondary | ICD-10-CM | POA: Diagnosis not present

## 2021-05-11 DIAGNOSIS — J300 Vasomotor rhinitis: Secondary | ICD-10-CM | POA: Diagnosis not present

## 2021-05-18 DIAGNOSIS — E7800 Pure hypercholesterolemia, unspecified: Secondary | ICD-10-CM | POA: Diagnosis not present

## 2021-05-18 DIAGNOSIS — I10 Essential (primary) hypertension: Secondary | ICD-10-CM | POA: Diagnosis not present

## 2021-05-18 DIAGNOSIS — M8580 Other specified disorders of bone density and structure, unspecified site: Secondary | ICD-10-CM | POA: Diagnosis not present

## 2021-05-25 DIAGNOSIS — M0589 Other rheumatoid arthritis with rheumatoid factor of multiple sites: Secondary | ICD-10-CM | POA: Diagnosis not present

## 2021-06-03 DIAGNOSIS — Z1231 Encounter for screening mammogram for malignant neoplasm of breast: Secondary | ICD-10-CM | POA: Diagnosis not present

## 2021-06-03 DIAGNOSIS — Z6831 Body mass index (BMI) 31.0-31.9, adult: Secondary | ICD-10-CM | POA: Diagnosis not present

## 2021-06-03 DIAGNOSIS — Z01419 Encounter for gynecological examination (general) (routine) without abnormal findings: Secondary | ICD-10-CM | POA: Diagnosis not present

## 2021-06-03 DIAGNOSIS — N952 Postmenopausal atrophic vaginitis: Secondary | ICD-10-CM | POA: Diagnosis not present

## 2021-06-06 ENCOUNTER — Other Ambulatory Visit: Payer: Self-pay

## 2021-06-06 ENCOUNTER — Ambulatory Visit: Payer: PPO | Admitting: Psychiatry

## 2021-06-06 DIAGNOSIS — F411 Generalized anxiety disorder: Secondary | ICD-10-CM | POA: Diagnosis not present

## 2021-06-06 NOTE — Progress Notes (Signed)
[facility] Counselor/Therapist Progress Note  Patient ID: Andrea Ibarra, MRN: [id number],    Date: 06/06/2021  Time Spent: 58 minutes   Treatment Type: Individual Therapy  Reported Symptoms: anxiety  Mental Status Exam:  Appearance:   Casual     Behavior:  Appropriate, Sharing, and Motivated  Motor:  Normal  Speech/Language:   Clear and Coherent  Affect:  anxious  Mood:  anxious  Thought process:  goal directed  Thought content:    Some obsessiveness  Sensory/Perceptual disturbances:    WNL  Orientation:  oriented to person, place, time/date, situation, day of week, month of year, year, and stated date of Sept 19, 2022  Attention:  Good  Concentration:  Good and Fair  Memory:  Some memory issues and   Fund of knowledge:   Good  Insight:    Good and Fair  Judgment:   Good  Impulse Control:  Good   Risk Assessment: Danger to Self:  No Self-injurious Behavior: No Danger to Others: No Duty to Warn:no Physical Aggression / Violence:No  Access to Firearms a concern: No  Gang Involvement:No   Subjective:  Patient in today reporting daily anxiety due to personal, family, financial concerns, health concerns. Easy to over-stress and worry although has been working to decrease this.  Has had multiple challenges and situations which she is concerned about more recently and "trying to manage" but it has heightened her anxiety at times.  Denies any SI.  Vented her concerns and anxieties freely today and some brief tearfulness, just feeling overwhelmed and not having many people/places where she can talk openly and confidentially with someone.  Seemed to be very helpful for her to share and get support today and she was calmer and more grounded by session end.  Looked at some ways she can better manage times of higher anxiety including things that have helped her in the past: prayer, talking it out confidentially, spending time with other friends, attending her church, reading  supportive books, getting out for a drive, paying attention to triggers to her anxiety, and talking with others in her apartment building in retirement community.  Is trying to decrease her tendency to jump ahead and assume the worst case scenarios but she finds it very difficult when there are new stressors or multiple stressors at once.  She continues to work on her goals and is better at times when stressors are less.  Interventions: Solution-Oriented/Positive Psychology, Ego-Supportive, and Insight-Oriented  Diagnosis:   ICD-10-CM   1. Generalized anxiety disorder  F41.1       Treatment goal plan: Patient not signing tx plan on computer screen due to [place]. Treatment Goals: Goals may remain on tx plan as patient works on strategies to meet her goals.  Progress will be documented each session in the "Progress" section of Plan. Long term goal: Reduce overall level, frequency, and intensity of the anxiety so that daily functioning is not impaired.  Short term goal: Increase understanding of beliefs and messages that lead to worry and anxiety.  Patient will share her understanding in written form or verbally in sessions, or both. Strategy: Identify, challenge, and replace anxious or negative self-talk with positive, realistic, and empowering self-talk.     Plan: Patient today showing good motivation and very engaged in session today.  She has had several additional stressors more recently which has heightened her anxiety and worked hard today in talking through the stressors as well as working on some strategies to  better manage her anxiety associated with the stressors, including deep breathing exercises, having more quiet moments to herself where she can focus on being calm and maybe include some prayers as that is very important for her, being around people who are not stressful to her, walking as she is able, using meditation books which she finds helpful, and a couple of friendships that  are more supportive to her.  Encouraged patient to follow through on practicing positive behaviors including: Using the strategies mentioned above in reference to her increased anxiety, staying in touch with people who are supportive, limiting her consumption of world news especially distressing news, replacing anxious thoughts with more realistic thoughts, intentionally look for more positives than negatives each day, get outside some each day and walk or sit as she is able at her retirement center, look for the positives within herself and the strength within herself, set appropriate boundaries with others as needed, stay in the present focusing on what she can control, interact with others frequently especially those who are healthy for her, and feel good about the strength she is showing as she works with goal-directed behaviors to develop better coping skills in managing anxiety and to move forward in a direction of improved overall emotional health.  Goal review and progress/challenges noted with patient.  Next appointment within approximately 4 weeks  Shanon Ace, LCSW

## 2021-06-17 DIAGNOSIS — I10 Essential (primary) hypertension: Secondary | ICD-10-CM | POA: Diagnosis not present

## 2021-06-17 DIAGNOSIS — E7800 Pure hypercholesterolemia, unspecified: Secondary | ICD-10-CM | POA: Diagnosis not present

## 2021-06-17 DIAGNOSIS — M8580 Other specified disorders of bone density and structure, unspecified site: Secondary | ICD-10-CM | POA: Diagnosis not present

## 2021-06-28 DIAGNOSIS — G894 Chronic pain syndrome: Secondary | ICD-10-CM | POA: Diagnosis not present

## 2021-06-28 DIAGNOSIS — M5136 Other intervertebral disc degeneration, lumbar region: Secondary | ICD-10-CM | POA: Diagnosis not present

## 2021-06-28 DIAGNOSIS — M5416 Radiculopathy, lumbar region: Secondary | ICD-10-CM | POA: Diagnosis not present

## 2021-07-14 ENCOUNTER — Ambulatory Visit: Payer: PPO | Admitting: Psychiatry

## 2021-07-14 ENCOUNTER — Other Ambulatory Visit: Payer: Self-pay | Admitting: Cardiovascular Disease

## 2021-07-21 DIAGNOSIS — J40 Bronchitis, not specified as acute or chronic: Secondary | ICD-10-CM | POA: Diagnosis not present

## 2021-08-04 ENCOUNTER — Ambulatory Visit: Payer: PPO | Admitting: Psychiatry

## 2021-08-04 ENCOUNTER — Other Ambulatory Visit: Payer: Self-pay

## 2021-08-04 DIAGNOSIS — F411 Generalized anxiety disorder: Secondary | ICD-10-CM | POA: Diagnosis not present

## 2021-08-04 NOTE — Progress Notes (Signed)
[facility] Counselor/Therapist Progress Note  Patient ID: Labrea MOET MIKULSKI, MRN: [id number],    Date: 08/04/2021  Time Spent: 55 minutes   Treatment Type: Individual Therapy  Reported Symptoms: anxiety, frustration, worries a lot  Mental Status Exam:  Appearance:   Well Groomed     Behavior:  Appropriate, Sharing, and Motivated  Motor:  Uses a cane to walk  Speech/Language:   Clear and Coherent  Affect:  anxious  Mood:  anxious  Thought process:  goal directed  Thought content:    overthinking  Sensory/Perceptual disturbances:    WNL  Orientation:  oriented to person, place, time/date, situation, day of week, month of year, year, and stated date of Nov.17, 2022  Attention:  Good  Concentration:  Good and Fair  Memory:  Some short term memory issues and Dr is aware  Fund of knowledge:   Good  Insight:    Good and Fair  Judgment:   Good  Impulse Control:  Good   Risk Assessment: Danger to Self:  No Self-injurious Behavior: No Danger to Others: No Duty to Warn:no Physical Aggression / Violence:No  Access to Firearms a concern: No  Gang Involvement:No   Subjective: Patient in today reporting increased anxiety and increased worrying due to Covid an flu increase in the area, aging issues, personal, family, and health concerns. Multiple challenges and concerns leads her to worry a lot and worked today with some specific examples and focused on being able to interrupt her excessive worrying which tends to keep her more "on edge" and assuming worst case scenarios. Discussed the effects of worrying on her mentally and in some ways physically, and how it contributes to her not being able to move forward with some issues that she works on especially regarding personal and family situations.  She seemed to relate to the goal of trying to interrupt some of her anxious thoughts and be able to challenge them some and then replace with more reality-based thoughts, and I encouraged her to  try that some on her own before next session.  Used a couple more examples with her before session end.  Patient was less anxious and more grounded, and her goal is to help her get to a point where her anxiety does not build so quickly or that her first response to situations is not to be excessively anxious but to think through things a little more and not make anxious assumptions.  Denies any SI and expresses how she finds it helpful to come and be able to vent confidentially about her concerns, get support as well as suggestions for better handling her stressors.  Interventions: Solution-Oriented/Positive Psychology, Ego-Supportive, and Insight-Oriented  Diagnosis:   ICD-10-CM   1. Generalized anxiety disorder  F41.1      Treatment goal plan: Patient not signing tx plan on computer screen due to [place]. Treatment Goals: Goals may remain on tx plan as patient works on strategies to meet her goals.  Progress will be documented each session in the "Progress" section of Plan. Long term goal: Reduce overall level, frequency, and intensity of the anxiety so that daily functioning is not impaired.  Short term goal: Increase understanding of beliefs and messages that lead to worry and anxiety.  Patient will share her understanding in written form or verbally in sessions, or both. Strategy: Identify, challenge, and replace anxious or negative self-talk with positive, realistic, and empowering self-talk.    Plan:  Patient today showing good motivation and participated  actively in session.  She was very talkative which I think is more an indication of the fact she has friends but often does not feel heard and listened to by many people, as she has commented before that she feels that when she comes here for appointments and that is important to her.  Worked today on some of her anxieties in reference to family, personal, and her health concerns and also some situations in her retirement community living  environment, all of which contribute to her stress and anxiety level.  Really encouraged her to work on the worrying and trying to decrease it.  Discussed the difference between being concerned about something versus being worried, with the healthier way of managing something being a person of concern versus a chronic worrier.  Encouraged patient and her practicing positive behaviors including:  Having quiet moments to herself where she can focus on being calm. Deep breathing exercises. Prayers and meditations that she feels very important for her. Spend time around people who are not stressful to her. Walking as she is able. Using meditation books and other readings that she finds helpful. Spending time with a couple of friendships that are more supportive to her. Believing in herself more. Staying in touch with people who are supportive. Limiting her consumption of world news especially distressing news. Taking breaks from her electronics. Replacing anxious thoughts with more realistic thoughts. Intentionally look for more positives versus negatives daily. Walk around some inside her retirement center. Find the positives within herself. Fine strength within herself. Set appropriate boundaries with others as needed. Stay in the present focusing on what she can control or change. Interact with others frequently especially those who are healthy for her. Recognize the strength she shows as she works with goal-directed behaviors to develop improved coping skills in managing anxiety and to move forward in a direction of improved overall emotional health.    Goal review and progress/challenges noted with patient.  Next appointment within 4 weeks.  This record has been created using [employer].  Chart creation errors have been sought, but may not always have been located and corrected.  Such creation errors do not reflect on the standard of medical care provided.    Shanon Ace,  LCSW

## 2021-08-25 DIAGNOSIS — Z6830 Body mass index (BMI) 30.0-30.9, adult: Secondary | ICD-10-CM | POA: Diagnosis not present

## 2021-08-25 DIAGNOSIS — R600 Localized edema: Secondary | ICD-10-CM | POA: Diagnosis not present

## 2021-08-25 DIAGNOSIS — G4709 Other insomnia: Secondary | ICD-10-CM | POA: Diagnosis not present

## 2021-08-25 DIAGNOSIS — M150 Primary generalized (osteo)arthritis: Secondary | ICD-10-CM | POA: Diagnosis not present

## 2021-08-25 DIAGNOSIS — M0589 Other rheumatoid arthritis with rheumatoid factor of multiple sites: Secondary | ICD-10-CM | POA: Diagnosis not present

## 2021-08-25 DIAGNOSIS — M25551 Pain in right hip: Secondary | ICD-10-CM | POA: Diagnosis not present

## 2021-08-25 DIAGNOSIS — M5136 Other intervertebral disc degeneration, lumbar region: Secondary | ICD-10-CM | POA: Diagnosis not present

## 2021-08-25 DIAGNOSIS — M722 Plantar fascial fibromatosis: Secondary | ICD-10-CM | POA: Diagnosis not present

## 2021-08-25 DIAGNOSIS — E669 Obesity, unspecified: Secondary | ICD-10-CM | POA: Diagnosis not present

## 2021-09-15 DIAGNOSIS — E7801 Familial hypercholesterolemia: Secondary | ICD-10-CM | POA: Diagnosis not present

## 2021-09-15 DIAGNOSIS — E559 Vitamin D deficiency, unspecified: Secondary | ICD-10-CM | POA: Diagnosis not present

## 2021-09-20 DIAGNOSIS — E559 Vitamin D deficiency, unspecified: Secondary | ICD-10-CM | POA: Diagnosis not present

## 2021-09-20 DIAGNOSIS — E7800 Pure hypercholesterolemia, unspecified: Secondary | ICD-10-CM | POA: Diagnosis not present

## 2021-09-20 DIAGNOSIS — I10 Essential (primary) hypertension: Secondary | ICD-10-CM | POA: Diagnosis not present

## 2021-09-20 DIAGNOSIS — I2510 Atherosclerotic heart disease of native coronary artery without angina pectoris: Secondary | ICD-10-CM | POA: Diagnosis not present

## 2021-09-22 ENCOUNTER — Ambulatory Visit: Payer: PPO | Admitting: Psychiatry

## 2021-09-22 ENCOUNTER — Other Ambulatory Visit: Payer: Self-pay

## 2021-09-22 DIAGNOSIS — F411 Generalized anxiety disorder: Secondary | ICD-10-CM | POA: Diagnosis not present

## 2021-09-22 NOTE — Progress Notes (Signed)
[facility] Counselor/Therapist Progress Note  Patient ID: Andrea Ibarra, MRN: [id number],    Date: 09/22/2021  Time Spent: 50 minutes   Treatment Type: Individual Therapy  Reported Symptoms: anxiety, worries excessively  Mental Status Exam:  Appearance:   Neat     Behavior:  Appropriate, Sharing, and Motivated  Motor:  Uses cane in walking  Speech/Language:   Clear and Coherent  Affect:  anxious  Mood:  anxious  Thought process:  goal directed  Thought content:    overthinking  Sensory/Perceptual disturbances:    WNL  Orientation:  oriented to person, place, time/date, situation, day of week, month of year, year, and stated date of Jan. 5, 2023  Attention:  Good  Concentration:  Good and Fair  Memory:  Some recent memory issues  Fund of knowledge:   Good  Insight:    Good and Fair  Judgment:   Good  Impulse Control:  Good   Risk Assessment: Danger to Self:  No Self-injurious Behavior: No Danger to Others: No Duty to Warn:no Physical Aggression / Violence:No  Access to Firearms a concern: No  Gang Involvement:No   Subjective:  Patient in today reporting anxiety and excessive worrying. Worry and anxiety are mostly related to her adult children and their families, her own health concerns, and financial stressors. Worries about Covid that "doesn't seem to stop".  Was able to process a lot of her thoughts and feelings and concerns about each of these areas, and also worked on looking at "what I can control and what I cannot control" as most of her worries tend to be things that she cannot control, and that is difficult for patient.  Again, reviewed the cycles of worry that she tends to go through and how these rob her of more fully enjoying other parts of her life for which she is grateful.  Does stay in contact with some friends who do not tend to be excessive worriers which is good for patient.  Also states that her faith and church community are helpful.  Family, not too  helpful in that area as they tend to be excessive worrier is also. Also looked at how her excessive worrying tends to keep her from moving forward in some other areas that she has been working on, and she does note that she feels like she does not "assume worst case scenarios" as much as she used to.  Was more grounded by end of session after talking through a lot of her anxieties and "worries".  Encouraged her more social behavior with others in her retirement community or church, and especially those who do not tend to be excessive worriers.  Denies any SI and states that she does find it helpful to be able to come to sessions and openly share her thoughts and feelings without judgment but also appreciates the feedback and suggestions for certain goal-directed behaviors that she needs to work on to experience less anxiety.  Interventions: Solution-Oriented/Positive Psychology and Insight-Oriented   Treatment goal plan: Patient not signing tx plan on computer screen due to [place]. Treatment Goals: Goals may remain on tx plan as patient works on strategies to meet her goals.  Progress will be documented each session in the "Progress" section of Plan. Long term goal: Reduce overall level, frequency, and intensity of the anxiety so that daily functioning is not impaired.  Short term goal: Increase understanding of beliefs and messages that lead to worry and anxiety.  Patient will share  her understanding in written form or verbally in sessions, or both. Strategy: Identify, challenge, and replace anxious or negative self-talk with positive, realistic, and empowering self-talk.   Diagnosis:   ICD-10-CM   1. Generalized anxiety disorder  F41.1      Plan: Patient today showing motivation and actively participated in session as she worked further on her anxiety and excessive worrying related to personal and family concerns.  Patient was very involved in processing a lot of her anxious and worrisome  thoughts but does find it hard to interrupt her worrying when she is alone.  Works hard in session and today spoke about some ways that might help her to have more follow through on these issues and confront her worrying and anxiety when she is home.  Also looked at some positive behaviors for her in terms of being involved with more friends that are healthy for her.  Looked closer today at how a lot of her worrying is about things that she has no control over and how the worrying itself tends to wear down physically and emotionally to where it affects her strength to be able to deal with what ever does happen.  Seems to feel like at times that the more she worries the better prepared she is to deal with what ever happens, but discussed today how actually the worrying because of the way it wears her down, does not help her to prepare for what ever difficulty may occur.  Patient very involved in this conversation and we used several current and recent examples in in our discussion.  Also redirected with patient to focus on some of the things that she can control versus cannot control which seem to be of help to patient.  Encouraged patient and her attempts to practice more positive behaviors including: Deep breathing exercises, prayers and meditations that she feels very important for her, having quiet moments to herself where she can focus on just being calm and in the moment, spend time around other people who are not stressful for her, walking as she is able, spending time with a couple of friends that are very supportive to her, believing in herself more, staying in touch with people who are supportive, for every negative thoughts create 2 positives, focus more on some of her progress made more frequently, limiting her consumption of world news especially distressing news, taking breaks from her electronics, replacing anxious thoughts with more realistic thoughts, intentionally looking for more positives versus  negatives daily, walking around inside her retirement center, finding the positives within herself, finding strength within herself, recognize even little bits of progress more often, stay in the present focusing on what she can control, interact with others frequently especially those who are healthy for her, and feel good about the strength she shows as she works with goal-directed behaviors to develop improved coping skills and managing anxiety to move forward in a direction of improved overall emotional health.  Goal review and progress/challenges noted with patient.  Next appointment within 1 month.  This record has been created using [employer].  Chart creation errors have been sought, but may not always have been located and corrected.  Such creation errors do not reflect on the standard of medical care provided.   Shanon Ace, LCSW

## 2021-10-04 ENCOUNTER — Telehealth: Payer: Self-pay | Admitting: Cardiovascular Disease

## 2021-10-04 ENCOUNTER — Ambulatory Visit: Payer: PPO | Admitting: Cardiovascular Disease

## 2021-10-11 ENCOUNTER — Other Ambulatory Visit: Payer: Self-pay | Admitting: Cardiovascular Disease

## 2021-10-20 ENCOUNTER — Ambulatory Visit (INDEPENDENT_AMBULATORY_CARE_PROVIDER_SITE_OTHER): Payer: PPO | Admitting: Psychiatry

## 2021-10-20 DIAGNOSIS — F411 Generalized anxiety disorder: Secondary | ICD-10-CM | POA: Diagnosis not present

## 2021-10-20 NOTE — Progress Notes (Addendum)
[facility] Counselor/Therapist Progress Note  Patient ID: Andrea Ibarra, MRN: [id number],    Date: 10/20/2021  Time Spent: 50 minutes   Virtual Visit via Telehealth Note:  Telephone only Connected with patient by a telemedicine/telehealth application, with their informed consent, and verified patient privacy and that I am speaking with the correct person using two identifiers. I discussed the limitations, risks, security and privacy concerns of performing psychotherapy and the availability of in person appointments. I also discussed with the patient that there may be a patient responsible charge related to this service. The patient expressed understanding and agreed to proceed. I discussed the treatment planning with the patient. The patient was provided an opportunity to ask questions and all were answered. The patient agreed with the plan and demonstrated an understanding of the instructions. The patient was advised to call  our office if  symptoms worsen or feel they are in a crisis state and need immediate contact.   Therapist Location: [facility] Patient Location: home   Treatment Type: Individual Therapy  Reported Symptoms: anxiety, some fearful thoughts  Mental Status Exam:  Appearance:   N/a   Telehealth      Behavior:  Appropriate, Sharing, and Motivated  Motor:  Uses cane to walk  Speech/Language:   Clear and Coherent  Affect:  N/a   telehealth  Mood:  anxious and depressed  Thought process:  Some tangentiality  Thought content:    Obsessions and overthinking  Sensory/Perceptual disturbances:    WNL  Orientation:  oriented to person, place, time/date, situation, day of week, month of year, year, and stated date of Feb. 2, 2023  Attention:  Good  Concentration:  Good and Fair  Memory:  Some short term memory issues  Fund of knowledge:   Good and Fair  Insight:    Good and Fair  Judgment:   Good  Impulse Control:  Good   Risk Assessment: Danger to  Self:  No Self-injurious Behavior: No Danger to Others: No Duty to Warn:no Physical Aggression / Violence:No  Access to Firearms a concern: No  Gang Involvement:No   Subjective:  Patient today reporting anxiety and worrying about personal and family concerns. Needed session today to share and process her anxious and "worrisome" thoughts in reference to family situations and was eventually able to feel more calm and grounded.  (Not all details included in this note due to patient privacy needs).  Tendency to look for and fear what might go wrong versus right, and also worrying about things out of her control. Worked with these today focusing on helping her realize the stress and fear she experiences by assuming worst case scenarios versus if she stopped those assumptions (which really stresses her) and instead, practiced looking for more positive outcomes and trusting that she can handle situation even if they are stressful or challenging, also knowing that she has lots of good support around her.  Encouraged her to keep aiming for frequent contact with her friends as she has some good support amongst them.  Interventions: Solution-Oriented/Positive Psychology and Ego-Supportive  Treatment goal plan: Patient not signing tx plan on computer screen due to [place]. Treatment Goals: Goals may remain on tx plan as patient works on strategies to meet her goals.  Progress will be documented each session in the "Progress" section of Plan. Long term goal: Reduce overall level, frequency, and intensity of the anxiety so that daily functioning is not impaired.  Short term goal: Increase understanding of  beliefs and messages that lead to worry and anxiety.  Patient will share her understanding in written form or verbally in sessions, or both. Strategy: Identify, challenge, and replace anxious or negative self-talk with positive, realistic, and empowering self-talk.  Diagnosis:   ICD-10-CM   1. Generalized  anxiety disorder  F41.1      Plan:  Patient today showing motivation and participated well in session, working more on her excessive worrying and anxiety related to personal, family, and friend concerns.  Did well in processing these concerns today and especially personal and family concerns.  Still tending to over think and be overly worried about some things and is working to decrease her worrying, realizing that the worrying really does not help.  She admits that she has worried for a long long time and that it is a hard habit to break but definitely wants to break it.  She has made some gains in that area and does feel good about that.  Encouraged patient in her practice of more positive behaviors including: Using deep breathing exercises when more anxious and stressed, prayers and meditations that she feels are very important for her and helpful, having quiet moments to herself where she can focus on just being calm in the moment, spend time around other people who are not stressful for her, walking as she is able, spending time with at least 2 of her friends who are very supportive of her, believing in herself more, staying in touch with people who care about her, for every negative thought create 2 positives, focus more on some of her progress made, limiting how much she tends and to world news that is distressing as this adds to her anxiety and worry, take breaks from her electronics, replace anxious thoughts with more realistic thoughts, intentionally looking for more positives versus negatives each day, walking around inside her retirement center, finding the positives within herself and being able to name them, finding strength within herself, recognize even little bits of progress more often, stay in the present focusing on what she can control or change, interact with others more frequently especially those who are healthy for her, and recognize the strength she shows working with goal-directed  behaviors to move forward in a direction of improved emotional health.   Goal review and progress/challenges noted with patient.  Next appointment within 4 to 5 weeks.  This record has been created using [employer].  Chart creation errors have been sought, but may not always have been located and corrected.  Such creation errors do not reflect on the standard of medical care provided.  Shanon Ace, LCSW

## 2021-10-21 ENCOUNTER — Encounter: Payer: Self-pay | Admitting: Cardiovascular Disease

## 2021-10-21 ENCOUNTER — Ambulatory Visit: Payer: PPO | Admitting: Cardiovascular Disease

## 2021-10-21 ENCOUNTER — Other Ambulatory Visit: Payer: Self-pay

## 2021-10-21 VITALS — BP 128/72 | HR 76 | Ht 59.0 in | Wt 147.0 lb

## 2021-10-21 DIAGNOSIS — I2510 Atherosclerotic heart disease of native coronary artery without angina pectoris: Secondary | ICD-10-CM | POA: Diagnosis not present

## 2021-10-21 DIAGNOSIS — I255 Ischemic cardiomyopathy: Secondary | ICD-10-CM | POA: Diagnosis not present

## 2021-10-21 DIAGNOSIS — E782 Mixed hyperlipidemia: Secondary | ICD-10-CM

## 2021-10-21 NOTE — Patient Instructions (Signed)
Medication Instructions:  Your physician recommends that you continue on your current medications as directed. Please refer to the Current Medication list given to you today.  *If you need a refill on your cardiac medications before your next appointment, please call your pharmacy*   Lab Work: NONE If you have labs (blood work) drawn today and your tests are completely normal, you will receive your results only by: [place] (if you have MyChart) OR A paper copy in the mail If you have any lab test that is abnormal or we need to change your treatment, we will call you to review the results.   Testing/Procedures: NONE   Follow-Up: At [facility], you and your health needs are our priority.  As part of our continuing mission to provide you with exceptional heart care, we have created designated Provider Care Teams.  These Care Teams include your primary Cardiologist (physician) and Advanced Practice Providers (APPs -  Physician Assistants and Nurse Practitioners) who all work together to provide you with the care you need, when you need it.  Your next appointment:   1 year(s)  The format for your next appointment:   In Person  Provider:   Sherren Mocha, MD     Thank you for allowing myself and [facility] to serve you, God Bless!!
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
## 2021-11-23 DIAGNOSIS — M0589 Other rheumatoid arthritis with rheumatoid factor of multiple sites: Secondary | ICD-10-CM | POA: Diagnosis not present

## 2021-11-23 DIAGNOSIS — M545 Low back pain, unspecified: Secondary | ICD-10-CM | POA: Diagnosis not present

## 2021-11-29 ENCOUNTER — Ambulatory Visit: Payer: PPO | Admitting: Psychiatry

## 2021-11-30 DIAGNOSIS — L570 Actinic keratosis: Secondary | ICD-10-CM | POA: Diagnosis not present

## 2021-11-30 DIAGNOSIS — L821 Other seborrheic keratosis: Secondary | ICD-10-CM | POA: Diagnosis not present

## 2021-11-30 DIAGNOSIS — D225 Melanocytic nevi of trunk: Secondary | ICD-10-CM | POA: Diagnosis not present

## 2021-11-30 DIAGNOSIS — D2239 Melanocytic nevi of other parts of face: Secondary | ICD-10-CM | POA: Diagnosis not present

## 2021-11-30 DIAGNOSIS — D224 Melanocytic nevi of scalp and neck: Secondary | ICD-10-CM | POA: Diagnosis not present

## 2021-11-30 DIAGNOSIS — L853 Xerosis cutis: Secondary | ICD-10-CM | POA: Diagnosis not present

## 2021-11-30 DIAGNOSIS — D1801 Hemangioma of skin and subcutaneous tissue: Secondary | ICD-10-CM | POA: Diagnosis not present

## 2021-11-30 DIAGNOSIS — D692 Other nonthrombocytopenic purpura: Secondary | ICD-10-CM | POA: Diagnosis not present

## 2021-12-01 ENCOUNTER — Ambulatory Visit (INDEPENDENT_AMBULATORY_CARE_PROVIDER_SITE_OTHER): Payer: PPO | Admitting: Psychiatry

## 2021-12-01 ENCOUNTER — Other Ambulatory Visit: Payer: Self-pay

## 2021-12-01 DIAGNOSIS — F411 Generalized anxiety disorder: Secondary | ICD-10-CM | POA: Diagnosis not present

## 2021-12-01 NOTE — Progress Notes (Signed)
?    [facility] Counselor/Therapist Progress Note ? ?Patient ID: Andrea Ibarra, MRN: [id number],   ? ?Date: 12/01/2021 ? ?Time Spent: 48 minutes  ? ?Treatment Type: Individual Therapy ? ?Reported Symptoms: anxiety, worry ? ?Mental Status Exam: ? ?Appearance:   Neat     ?Behavior:  Appropriate, Sharing, and Motivated  ?Motor:  Normal  ?Speech/Language:   Clear and Coherent  ?Affect:  anxious  ?Mood:  anxious  ?Thought process:  goal directed  ?Thought content:    Some ruminating and overthinking  ?Sensory/Perceptual disturbances:    WNL  ?Orientation:  oriented to person, place, time/date, situation, day of week, month of year, year, and stated date of December 01, 2021  ?Attention:  Good  ?Concentration:  Good and Fair  ?Memory:  Some occasional short term memory issues  ?Fund of knowledge:   Good  ?Insight:    Good  ?Judgment:   Good  ?Impulse Control:  Good  ? ?Risk Assessment: ?Danger to Self:  No ?Self-injurious Behavior: No ?Danger to Others: No ?Duty to Warn:no ?Physical Aggression / Violence:No  ?Access to Firearms a concern: No  ?Gang Involvement:No  ? ?Subjective:  Patient today reporting anxiety "which equals worry, which equals my family."  Shared her anxieties about multiple family stressors and very hard for patient to not over-stress and assume what may go wrong versus right. This is very difficult for patient and she is showing efforts to set better limits with her excessive worrying and dwelling on the negatives. She did mention concerns about her "forgetting at times" but added she doesn't feel she "is any worse than others her age." Talked further about the potential effects of ongoing excessive worrying and how if can affect cognitive health. Discussed ways she can decrease her worrying and she participated well in this discussion and will hopefully follow through and working on this more.  Does state that she realizes no matter how much she worries "that it will not make bad things not happen".   Have encouraged her being more social and watching less on line and TV news.  Does have friends that she is in contact with which she describes as fellow worriers, in addition to a daughter that she is concerned about her level of worrying as well.  Sometimes feels that worrying helps prepare her to handle "bad things", and we looked back at some of the things in her life that she has handled and worrying was not particularly helpful to her.  To work on this more next visit and share some worksheets with her that could help her. Encouraged her frequent contact with friends that are supportive of her. ? ?Interventions: Solution-Oriented/Positive Psychology, Ego-Supportive, and Insight-Oriented ? ?Treatment goal plan: ?Patient not signing tx plan on computer screen due to [facility]. ?Treatment Goals: ?Goals may remain on tx plan as patient works on strategies to meet her goals.  Progress will be documented each session in the "Progress" section of Plan. ?Long term goal: ?Reduce overall level, frequency, and intensity of the anxiety so that daily functioning is not impaired.  ?Short term goal: ?Increase understanding of beliefs and messages that lead to worry and anxiety.  Patient will share her understanding in written form or verbally in sessions, or both. ?Strategy: ?Identify, challenge, and replace anxious or negative self-talk with positive, realistic, and empowering self-talk. ? ?Diagnosis: ?  ICD-10-CM   ?1. Generalized anxiety disorder  F41.1   ?  ? ?Plan:  Patient today showing good motivation and active  participation in session as she worked more on her anxiety and excessive worrying mostly related to family, personal, and friend concerns.  Is concerned that she not worry too much because she does not want to affect her memory as she ages.  As noted above we did discuss the fact or how excessive worrying can affect cognitive health, and encouraged patient to continue her work to decrease her tendency to worry  which has been a very long-term habit for patient.  Did well in talking about these concerns more today especially as it relates also to personal and family issues. Encouraged patient in her practice of more positive behaviors including: Having quiet moments to herself where she can focus and just be calm in the moment, using deep breathing exercises for when more anxious and stressed, prayers and meditations that she feels are very important for her and helpful, spend time around other people who are not stressful for her, walking as she is able, spending time with at least 2 of her friends who are very supportive of her, believing in herself more, staying in touch with people who care about her, for every negative thought create 2 positives, focus more on some of her progress made, limiting how much she watches world news that is distressing as this adds to her anxiety and worry, take occasional breaks from her electronics, replace nervous anxious thoughts with more realistic thoughts, intentionally looking for more positives versus negatives daily, walking around inside her retirement center as she is able, finding the positives within herself and be able to name them, finding strength within herself, recognize even little bits of progress more often, stay in the present focusing on what she can control or change, and realize the strength she shows working with goal-directed behaviors to move forward in a direction that supports her improved emotional health and wellbeing. ? ?Goal review and progress/challenges noted with patient. ? ?Next appointment within 4 weeks. ? ?This record has been created using [employer].  Chart creation errors have been sought, but may not always have been located and corrected.  Such creation errors do not reflect on the standard of medical care provided. ? ? ?Shanon Ace, LCSW ? ? ? ? ? ? ? ? ? ? ? ? ? ? ? ? ? ? ?

## 2022-01-10 ENCOUNTER — Ambulatory Visit: Payer: PPO | Admitting: Psychiatry

## 2022-01-10 DIAGNOSIS — F411 Generalized anxiety disorder: Secondary | ICD-10-CM | POA: Diagnosis not present

## 2022-01-10 NOTE — Progress Notes (Signed)
?    [facility] Counselor/Therapist Progress Note ? ?Patient ID: Andrea Ibarra, MRN: [id number],   ? ?Date: 01/10/2022 ? ?Time Spent: 50 minutes  ? ?Treatment Type: Individual Therapy ? ?Reported Symptoms: anxiety, stressed ? ?Mental Status Exam: ? ?Appearance:   Neat     ?Behavior:  Appropriate, Sharing, and Motivated  ?Motor:  Normal  ?Speech/Language:   Clear and Coherent  ?Affect:  anxious  ?Mood:  anxious  ?Thought process:  Some tangentiality  ?Thought content:    Rumination  ?Sensory/Perceptual disturbances:    WNL  ?Orientation:  oriented to person, place, time/date, situation, day of week, month of year, year, and stated date of January 10, 2022  ?Attention:  Good  ?Concentration:  Good and Fair  ?Memory:  Some short term memory issues ; Dr is aware  ?Fund of knowledge:   Good  ?Insight:    Good and Fair  ?Judgment:   Good  ?Impulse Control:  Good  ? ?Risk Assessment: ?Danger to Self:  No ?Self-injurious Behavior: No ?Danger to Others: No ?Duty to Warn:no ?Physical Aggression / Violence:No  ?Access to Firearms a concern: No  ?Gang Involvement:No  ? ?Subjective: Patient today reports her anxiety "makes me worry a lot about my family". Worries about her 2 adult kids and their families, and all the violence that goes on in the world today, which which she processed today and seemed more calm by end of session. Has friends but not that many people that really listen to her and hear her. Reviewed some of our prior work on her worrying and assuming worst case scenarios, and worked with some specific examples to interrupt the "negative/worrisome" thoughts to challenge them and replace with more reality-based and empowering thoughts.  Really encouraged patient to use this exercise more in her daily life and she agrees to work on between now and next session.  Encouraged her ongoing contact with supportive friends, and consider setting limits with some others who tend to dwell on a lot of worrying and  negativity. ? ?Interventions: Solution-Oriented/Positive Psychology and Ego-Supportive ? ?Treatment goal plan: ?Patient not signing tx plan on computer screen due to [facility]. ?Treatment Goals: ?Goals may remain on tx plan as patient works on strategies to meet her goals.  Progress will be documented each session in the "Progress" section of Plan. ?Long term goal: ?Reduce overall level, frequency, and intensity of the anxiety so that daily functioning is not impaired.  ?Short term goal: ?Increase understanding of beliefs and messages that lead to worry and anxiety.  Patient will share her understanding in written form or verbally in sessions, or both. ?Strategy: ?Identify, challenge, and replace anxious or negative self-talk with positive, realistic, and empowering self-talk. ?  ? ?Diagnosis: ?  ICD-10-CM   ?1. Generalized anxiety disorder  F41.1   ?  ? ?Plan: Patient today was well motivated and actively participated in session as she worked further on her excessive worrying and managing stress better.  Her worrying tends to center around family, personal issues, and "how the world is gotten so violent with so many shootings".  Her adult son is a Engineer, structural and heightens her sensitivity about police shootings particularly.  Did well in sharing and talking through these concerns today in a setting where she could feel heard but also encouraged to work on limiting her consumption to news or online about more violence and tragedies as these do accelerate her anxiety, understandably.  Commits to working on this between sessions along with  making some written notes on her priorities for what she feels she needs to process in next session. Encouraged patient in her use of more positive behaviors including: Have more calming moments to herself using deep breathing exercises for when she is feeling more anxious and stressed, prayers and meditations that she feels are very important for her and helpful, spending time  around other people who are not stressful for her, walking as she is able, spending time with at least 2 of her friends who are very supportive, believing in herself more, staying in touch with people who care about her, focus more on some of her progress made, limiting how much she watches world news that is distressing as this adds to her anxiety, take occasional breaks from her electronics, replace nervous anxious thoughts with more realistic/empowering thoughts, intentionally look for more positives versus negatives daily, walk around her retirement center as she is able, find the positives within herself and be able to name them, stay in the present focusing on what she can control or change, and recognize the strength she shows working with goal directed behaviors to move forward in a direction that supports her emotional health. ? ?Goal review and progress/challenges noted with patient. ? ?Next appointment within approximately 4 weeks. ? ?This record has been created using [employer].  Chart creation errors have been sought, but may not always have been located and corrected.  Such creation errors do not reflect on the standard of medical care provided. ? ? ?Shanon Ace, LCSW ? ? ? ? ? ? ? ? ? ? ? ? ? ? ? ? ? ? ?

## 2022-01-11 DIAGNOSIS — Z961 Presence of intraocular lens: Secondary | ICD-10-CM | POA: Diagnosis not present

## 2022-01-11 DIAGNOSIS — H5010 Unspecified exotropia: Secondary | ICD-10-CM | POA: Diagnosis not present

## 2022-01-11 DIAGNOSIS — H52203 Unspecified astigmatism, bilateral: Secondary | ICD-10-CM | POA: Diagnosis not present

## 2022-01-17 DIAGNOSIS — G894 Chronic pain syndrome: Secondary | ICD-10-CM | POA: Diagnosis not present

## 2022-01-17 DIAGNOSIS — E785 Hyperlipidemia, unspecified: Secondary | ICD-10-CM | POA: Diagnosis not present

## 2022-01-17 DIAGNOSIS — G4709 Other insomnia: Secondary | ICD-10-CM | POA: Diagnosis not present

## 2022-01-17 DIAGNOSIS — M5116 Intervertebral disc disorders with radiculopathy, lumbar region: Secondary | ICD-10-CM | POA: Diagnosis not present

## 2022-01-27 ENCOUNTER — Ambulatory Visit: Payer: PPO | Admitting: Cardiovascular Disease

## 2022-02-01 DIAGNOSIS — M205X1 Other deformities of toe(s) (acquired), right foot: Secondary | ICD-10-CM | POA: Diagnosis not present

## 2022-02-01 DIAGNOSIS — M205X2 Other deformities of toe(s) (acquired), left foot: Secondary | ICD-10-CM | POA: Diagnosis not present

## 2022-02-01 DIAGNOSIS — I70203 Unspecified atherosclerosis of native arteries of extremities, bilateral legs: Secondary | ICD-10-CM | POA: Diagnosis not present

## 2022-02-01 DIAGNOSIS — L603 Nail dystrophy: Secondary | ICD-10-CM | POA: Diagnosis not present

## 2022-02-01 DIAGNOSIS — M19071 Primary osteoarthritis, right ankle and foot: Secondary | ICD-10-CM | POA: Diagnosis not present

## 2022-02-01 DIAGNOSIS — L602 Onychogryphosis: Secondary | ICD-10-CM | POA: Diagnosis not present

## 2022-02-01 DIAGNOSIS — M19072 Primary osteoarthritis, left ankle and foot: Secondary | ICD-10-CM | POA: Diagnosis not present

## 2022-02-09 ENCOUNTER — Ambulatory Visit: Payer: PPO | Admitting: Psychiatry

## 2022-02-15 DIAGNOSIS — E7800 Pure hypercholesterolemia, unspecified: Secondary | ICD-10-CM | POA: Diagnosis not present

## 2022-02-15 DIAGNOSIS — M8580 Other specified disorders of bone density and structure, unspecified site: Secondary | ICD-10-CM | POA: Diagnosis not present

## 2022-02-15 DIAGNOSIS — I10 Essential (primary) hypertension: Secondary | ICD-10-CM | POA: Diagnosis not present

## 2022-02-23 DIAGNOSIS — M1991 Primary osteoarthritis, unspecified site: Secondary | ICD-10-CM | POA: Diagnosis not present

## 2022-02-23 DIAGNOSIS — M5136 Other intervertebral disc degeneration, lumbar region: Secondary | ICD-10-CM | POA: Diagnosis not present

## 2022-02-23 DIAGNOSIS — M722 Plantar fascial fibromatosis: Secondary | ICD-10-CM | POA: Diagnosis not present

## 2022-02-23 DIAGNOSIS — R600 Localized edema: Secondary | ICD-10-CM | POA: Diagnosis not present

## 2022-02-23 DIAGNOSIS — M0589 Other rheumatoid arthritis with rheumatoid factor of multiple sites: Secondary | ICD-10-CM | POA: Diagnosis not present

## 2022-02-23 DIAGNOSIS — E669 Obesity, unspecified: Secondary | ICD-10-CM | POA: Diagnosis not present

## 2022-02-23 DIAGNOSIS — M25551 Pain in right hip: Secondary | ICD-10-CM | POA: Diagnosis not present

## 2022-02-23 DIAGNOSIS — Z6830 Body mass index (BMI) 30.0-30.9, adult: Secondary | ICD-10-CM | POA: Diagnosis not present

## 2022-02-23 DIAGNOSIS — G4709 Other insomnia: Secondary | ICD-10-CM | POA: Diagnosis not present

## 2022-02-28 DIAGNOSIS — L570 Actinic keratosis: Secondary | ICD-10-CM | POA: Diagnosis not present

## 2022-02-28 DIAGNOSIS — H61001 Unspecified perichondritis of right external ear: Secondary | ICD-10-CM | POA: Diagnosis not present

## 2022-03-10 ENCOUNTER — Ambulatory Visit (INDEPENDENT_AMBULATORY_CARE_PROVIDER_SITE_OTHER): Payer: PPO | Admitting: Psychiatry

## 2022-03-10 DIAGNOSIS — F411 Generalized anxiety disorder: Secondary | ICD-10-CM

## 2022-03-17 DIAGNOSIS — E7800 Pure hypercholesterolemia, unspecified: Secondary | ICD-10-CM | POA: Diagnosis not present

## 2022-03-17 DIAGNOSIS — I10 Essential (primary) hypertension: Secondary | ICD-10-CM | POA: Diagnosis not present

## 2022-03-17 DIAGNOSIS — M8580 Other specified disorders of bone density and structure, unspecified site: Secondary | ICD-10-CM | POA: Diagnosis not present

## 2022-03-20 DIAGNOSIS — E559 Vitamin D deficiency, unspecified: Secondary | ICD-10-CM | POA: Diagnosis not present

## 2022-03-20 DIAGNOSIS — Z0000 Encounter for general adult medical examination without abnormal findings: Secondary | ICD-10-CM | POA: Diagnosis not present

## 2022-03-20 DIAGNOSIS — R350 Frequency of micturition: Secondary | ICD-10-CM | POA: Diagnosis not present

## 2022-03-20 DIAGNOSIS — E7801 Familial hypercholesterolemia: Secondary | ICD-10-CM | POA: Diagnosis not present

## 2022-04-06 DIAGNOSIS — M069 Rheumatoid arthritis, unspecified: Secondary | ICD-10-CM | POA: Diagnosis not present

## 2022-04-06 DIAGNOSIS — I2510 Atherosclerotic heart disease of native coronary artery without angina pectoris: Secondary | ICD-10-CM | POA: Diagnosis not present

## 2022-04-06 DIAGNOSIS — Z0000 Encounter for general adult medical examination without abnormal findings: Secondary | ICD-10-CM | POA: Diagnosis not present

## 2022-04-06 DIAGNOSIS — I10 Essential (primary) hypertension: Secondary | ICD-10-CM | POA: Diagnosis not present

## 2022-04-06 DIAGNOSIS — M8580 Other specified disorders of bone density and structure, unspecified site: Secondary | ICD-10-CM | POA: Diagnosis not present

## 2022-04-06 DIAGNOSIS — Z23 Encounter for immunization: Secondary | ICD-10-CM | POA: Diagnosis not present

## 2022-04-06 DIAGNOSIS — L820 Inflamed seborrheic keratosis: Secondary | ICD-10-CM | POA: Diagnosis not present

## 2022-04-06 DIAGNOSIS — E7800 Pure hypercholesterolemia, unspecified: Secondary | ICD-10-CM | POA: Diagnosis not present

## 2022-04-06 DIAGNOSIS — E559 Vitamin D deficiency, unspecified: Secondary | ICD-10-CM | POA: Diagnosis not present

## 2022-05-11 ENCOUNTER — Ambulatory Visit: Payer: PPO | Admitting: Psychiatry

## 2022-05-11 DIAGNOSIS — F411 Generalized anxiety disorder: Secondary | ICD-10-CM | POA: Diagnosis not present

## 2022-05-11 NOTE — Progress Notes (Signed)
[facility] Counselor/Therapist Progress Note  Patient ID: Andrea Ibarra, MRN: [id number],    Date: 05/11/2022  Time Spent: 55 minutes   Treatment Type: Individual Therapy  Reported Symptoms: anxiety, excessive worrying  Mental Status Exam:  Appearance:   Casual     Behavior:  Appropriate, Sharing, and Motivated  Motor:  Uses a cane when walking  Speech/Language:   Clear and Coherent  Affect:  anxious  Mood:  anxious  Thought process:  goal directed  Thought content:    overthinking  Sensory/Perceptual disturbances:    WNL  Orientation:  oriented to person, place, time/date, situation, day of week, month of year, year, and stated date of May 11, 2022  Attention:  Good  Concentration:  Good and Fair  Memory:  Short term memory issues and states Dr is aware  Fund of knowledge:   Good  Insight:    Good and Fair  Judgment:   Good and Fair  Impulse Control:  Good   Risk Assessment: Danger to Self:  No Self-injurious Behavior: No Danger to Others: No Duty to Warn:no Physical Aggression / Violence:No  Access to Firearms a concern: No  Gang Involvement:No   Subjective:  Patient in today reporting anxiety and excessive worrying about health issues, arthritic pain in back and legs. Discussed today multiple stressors including her 2 adult children, health issues, "the crazy world". Encouraged patient to be able to focus less on local and world news which tends to heighten her anxiety.  Also encouraged her to limit her involvement when others are gathered talking about things that tend to heighten her worries.  Discussed some options for being involved in more activities at her retirement home that would be more uplifting for example some of the classes they offer and other programming.  Encouraged her walking some in the building where she lives as that is a safe environment for her.  Did seem more upbeat by end of session and able to name some of the activities she is looking  forward to being involved in with other people at her retirement community and beyond.  Some occasional forgetfulness but does not feel it is excessive and was not noticeable in session today.  Also encouraged her continued contact with supportive friends who tend to be a little more positive people and consider how much time to spend with others who focus a lot of excessive negatives, chronic worrying, and looking at what goes wrong versus right.  Interventions: Cognitive Behavioral Therapy and Ego-Supportive  Treatment goal plan: Patient not signing tx plan on computer screen due to [place]. Treatment Goals: Goals may remain on tx plan as patient works on strategies to meet her goals.  Progress will be documented each session in the "Progress" section of Plan. Long term goal: Reduce overall level, frequency, and intensity of the anxiety so that daily functioning is not impaired.  Short term goal: Increase understanding of beliefs and messages that lead to worry and anxiety.  Patient will share her understanding in written form or verbally in sessions, or both. Strategy: Identify, challenge, and replace anxious or negative self-talk with positive, realistic, and empowering self-talk.  Diagnosis:   ICD-10-CM   1. Generalized anxiety disorder  F41.1      Plan:  Patient showing progress as she works with goal-directed behaviors but also finds it difficult in trying to decrease her worrying and anxious thoughts.  She does live in a retirement community where often she is around other  people who has very similar issues and she has shared that that does make it more difficult in trying to decrease her worrying.  Challenged her to try a little more of the "homework" between sessions, that involves working more specifically with her goals and desire behavioral changes.  Gave her some specific examples of this to help her understand ways of working on this in between sessions.  Does note that she will do  "a little better for a while and then it is easy to slip up and go backwards".  Discussed some ways of trying to hold onto progress once we sense we are going in a forward direction, and also understand that she does not have to rush it.  Again encouraged her to be around more positive people that live near her as that might help her as she tries to have a more positive outlook, with less worry and anxiety. Encouraged patient in more positive behaviors including spending time around other people who are not stressful for her, having more calm moments to herself and being able to use some deep breathing exercises when she is feeling more anxious and stressed, using prayers and meditations for support, walking as she is able, spending time with at least 2 of her friends who are supportive for her, believing in herself more and getting through difficult times, staying in touch with people who care about her, focus on some of her progress made, limit how much she watches world news that is distressing as this adds to her anxiety, take occasional breaks from her electronics, intentionally look for more positives versus negatives daily, walk around her retirement center per approval of her doctors as she is able for exercise, find the positives within herself and be able to name them, stay in the present focusing on what she can control or change, refrain from assuming worst case scenarios, and realize the strength she shows when she works with goal directed behaviors to move forward in a direction that supports her emotional health.  Review and progress/challenges noted with patient.  Next appointment within 4-5 weeks  This record has been created using [employer].  Chart creation errors have been sought, but may not always have been located and corrected.  Such creation errors do not reflect on the standard of medical care provided.   Shanon Ace, LCSW

## 2022-05-30 DIAGNOSIS — G894 Chronic pain syndrome: Secondary | ICD-10-CM | POA: Diagnosis not present

## 2022-05-30 DIAGNOSIS — M5136 Other intervertebral disc degeneration, lumbar region: Secondary | ICD-10-CM | POA: Diagnosis not present

## 2022-06-06 DIAGNOSIS — M0589 Other rheumatoid arthritis with rheumatoid factor of multiple sites: Secondary | ICD-10-CM | POA: Diagnosis not present

## 2022-06-15 ENCOUNTER — Ambulatory Visit: Payer: PPO | Admitting: Psychiatry

## 2022-06-15 DIAGNOSIS — F411 Generalized anxiety disorder: Secondary | ICD-10-CM

## 2022-06-15 NOTE — Progress Notes (Signed)
[facility] Counselor/Therapist Progress Note  Patient ID: Andrea Ibarra, MRN: [id number],    Date: 06/15/2022  Time Spent: 55 minutes   Treatment Type: Individual Therapy  Reported Symptoms: anxiety, "sometimes has little spells when I get depressed" thinking of things that worry me or about my family "but not badly depressed"   Mental Status Exam:  Appearance:   Casual     Behavior:  Appropriate, Sharing, and Motivated  Motor:  Normal  Speech/Language:   Clear and Coherent  Affect:  Anxious  Mood:  anxious and some occasional mild depression  Thought process:  goal directed  Thought content:    Rumination  Sensory/Perceptual disturbances:    WNL  Orientation:  oriented to person, place, time/date, situation, day of week, month of year, year, and stated date of Sept. 28, 2023  Attention:  Good  Concentration:  Fair  Memory:  Reports some short term memory issues  Fund of knowledge:   Good  Insight:    Good and Fair  Judgment:   Good  Impulse Control:  Good   Risk Assessment: Danger to Self:  No Self-injurious Behavior: No Danger to Others: No Duty to Warn:no Physical Aggression / Violence:No  Access to Firearms a concern: No  Gang Involvement:No   Subjective:  Patient today reporting symptoms of anxiety and worrying (sometimes excessively).  Most of her anxiety and worry is related to her 2 adult children, "things that go on in the world that are distressing on the news", her own health issues especially her arthritis and back and legs.  Continue to encourage patient to not watch as much of the TV and on line news as it seems to feed her anxiety.  She states that it is hard for her not to watch the news because "I am a news junkie".  Still encouraged her to set some limits because I see how it affects her stress level, anxiety, and her frequent excessive worrying.  She states that often groups at her retirement home are sitting around talking to each other and it is  about things they all worry about, so I encouraged her to maybe limit some of those interactions and be involved more in activities that would be uplifting for her such as some of the classes they offer and other activities going on at that site. Encouraged patient to do some walking inside her building which offers a safe environment for her. Shared a incident that happened at local swimming pool recently with one of her grandkids who had to be taken to hospital but is ok now. "Really scared me but relieved he is ok." Seems to be mostly positive about her life at local retirement center, and enjoying all the interaction with other residents.  Acknowledges occasional forgetfulness but does not feel it is excessive and states her doctor has not been concerned about it.  There was no evidence of it in session today.  Encouraged her again in maintaining some contact with supportive friends especially including those who lean towards being more positive people who do not focus heavily on excessive worrying and the negatives.  Interventions: Cognitive Behavioral Therapy and Ego-Supportive   Treatment goal plan: Patient not signing tx plan on computer screen due to [place]. Treatment Goals: Goals may remain on tx plan as patient works on strategies to meet her goals.  Progress will be documented each session in the "Progress" section of Plan. Long term goal: Reduce overall level, frequency, and intensity  of the anxiety so that daily functioning is not impaired.  Short term goal: Increase understanding of beliefs and messages that lead to worry and anxiety.  Patient will share her understanding in written form or verbally in sessions, or both. Strategy: Identify, challenge, and replace anxious or negative self-talk with positive, realistic, and empowering self-talk.  Diagnosis:   ICD-10-CM   1. Generalized anxiety disorder  F41.1      Plan:  Patient today showing motivation and good participation in  session focusing on her anxiety and excessive worrying.  Encouraged her to watch less TV that overly focuses on distressing news.  Also encouraged, as noted above, her being around other people who tend to look for more positives versus negatives, and trying to avoid excessive worrying and the things that tend to lead her towards that.  Does seem to be showing more strength and continues to work with goal directed behaviors especially in addressing her anxiety and worry, and setting appropriate limits with others as needed.Encouraged patient in her practice of more positive behaviors as noted in session including: Spending time with at least 2 of her friends who are supportive for her and do not add to her anxiety, having more calm moments to herself and being able to use some deep breathing exercises when she is feeling more anxious or stressed, spending time with other people who are not stressful for her, using prayers and meditations for support, walking some as she is able for exercise, believing in herself more and getting through difficult times, staying in touch with people who care about her, focus on some of her progress made already, limit how much she watches world news as that is distressing and this adds to her anxiety, take occasional breaks from her electronics, intentionally look for more positives versus negatives each day, find the positives within herself, stay in the present focusing on what she can control or change, refrain from assuming worst case scenarios, and recognize the strength she shows when working with goal directed behaviors to move forward in a direction that supports her emotional health and overall wellbeing.  Review and progress/challenges noted with patient.  Next appointment within 4 weeks.  This record has been created using [employer].  Chart creation errors have been sought, but may not always have been located and corrected.  Such creation errors do not reflect  on the standard of medical care provided.   Shanon Ace, LCSW

## 2022-07-14 ENCOUNTER — Other Ambulatory Visit: Payer: Self-pay | Admitting: Cardiovascular Disease

## 2022-07-27 ENCOUNTER — Ambulatory Visit: Payer: PPO | Admitting: Psychiatry

## 2022-07-27 ENCOUNTER — Encounter: Payer: Self-pay | Admitting: Psychiatry

## 2022-07-27 DIAGNOSIS — F411 Generalized anxiety disorder: Secondary | ICD-10-CM | POA: Diagnosis not present

## 2022-07-27 NOTE — Progress Notes (Signed)
[facility] Counselor/Therapist Progress Note  Patient ID: Andrea Ibarra, MRN: [id number],    Date: 07/27/2022  Time Spent: 55 minutes   Treatment Type: Individual Therapy  Reported Symptoms: anxiety, worrying, some depression but doesn't last long, denies any SI.  Mental Status Exam:  Appearance:   Neat     Behavior:  Appropriate, Sharing, and Motivated  Motor:  Affected some by her arthritis  Speech/Language:   Clear and Coherent  Affect:  anxious  Mood:  anxious and some depression  Thought process:  normal  Thought content:    Rumination  Sensory/Perceptual disturbances:    WNL  Orientation:  oriented to person, place, time/date, situation, day of week, month of year, year, and stated date of Nov. 9, 2023  Attention:  Good  Concentration:  Good and Fair  Memory:  Some short term memory concerns and Dr is aware  Fund of knowledge:   Good  Insight:    Good and Fair  Judgment:   Good  Impulse Control:  Good   Risk Assessment: Danger to Self:  No Self-injurious Behavior: No Danger to Others: No Duty to Warn:no Physical Aggression / Violence:No  Access to Firearms a concern: No  Gang Involvement:No   Subjective:   Patient in today reporting symptoms of anxiety and worry.  States that most of her worrying most recently has been regarding things that go on in the world that are distressing, her own health, and also "I just worry about my 2 adult children. Talks ongoing in session today venting and expressing her frustrations and anxieties, mostly family related and her own health. Stated it felt good to her to be able to openly share confidential family concerns rather than overthinking about it just within herself. Encouraged patient in setting some realistic limits for herself especially with some increase health issues and she reports not having as much patience and tires more easily. Also encouraged her to decrease the amount of time watching news that is distressing.  She agreed to try to work towards some adjustments as discussed in session, that would be better for her physically and emotionally, including spending more time with friends who do not stir up as much and can be a more calming presents for her, as she has noted some of them before in session.  Interventions: Cognitive Behavioral Therapy and Ego-Supportive  Long term goal: Reduce overall level, frequency, and intensity of the anxiety so that daily functioning is not impaired.  Short term goal: Increase understanding of beliefs and messages that lead to worry and anxiety.  Patient will share her understanding in written form or verbally in sessions, or both. Strategy: Identify, challenge, and replace anxious or negative self-talk with positive, realistic, and empowering self-talk.  Diagnosis:   ICD-10-CM   1. Generalized anxiety disorder  F41.1      Plan:  Patient today actively participating in session as she worked further on her excessive anxiety and worrying.  Has a difficult time not feeling that her anxiety and worrying protects her in someway from future harm.  Watches the news excessively including disturbing events which she has been encouraged to limit.  States that she has always been a "news junkie" as the reason why she regularly watches the news even though a lot of it can be very disturbing and increase her anxiety and worrying. I was able to talk through some grief feelings regarding the death of her husband several years ago but is feeling at  now with the upcoming holidays.  Did well in sharing her grief and also how she has seen herself get better over the years and try to be more understanding of herself right now with it resurfacing some during pre-- holiday times.  Still encouraging patient to watch less disturbing TV programming and to take advantage of positive and upbeat programs that are offered there at her retirement community, as well as getting together with friends who  tend to be more upbeat rather than bring her down as she has described previously. Encouraged patient in practicing more positive behaviors as noted in session including: Having more calm moments to herself and being able to use some deep breathing exercises when she is feeling more anxious or stressed, spending time with at least 2 of her friends who are supportive for her and do not add to her anxiety, using prayers and meditations for support, walking some as she is able for exercise, believing in herself more and getting through difficult times, staying in touch with people who care about her, focus on some of her progress already made, limit how much she watches more news as that is distressing and adds to her anxiety, take occasional breaks from her electronics, finding the positives within herself and others, stay in the present focusing on what she can control her change, refrain from assuming worst-case scenarios and fears, and realize the strengths she shows when working with goal-directed behaviors to move forward in a direction that supports her emotional health and overall wellbeing.  Goal review and progress/challenges noted with patient.  Next appointment within 1 month.  This record has been created using [employer].  Chart creation errors have been sought, but may not always have been located and corrected.  Such creation errors do not reflect on the standard of medical care provided.   Shanon Ace, LCSW

## 2022-08-24 ENCOUNTER — Ambulatory Visit: Payer: PPO | Admitting: Psychiatry

## 2022-08-24 DIAGNOSIS — F411 Generalized anxiety disorder: Secondary | ICD-10-CM

## 2022-08-24 NOTE — Progress Notes (Signed)
[facility] Counselor/Therapist Progress Note  Patient ID: Andrea Ibarra, MRN: [id number],    Date: 08/24/2022  Time Spent: 55 minutes   Treatment Type: Individual Therapy  Reported Symptoms: anxiety, worries  Mental Status Exam:  Appearance:   Casual     Behavior:  Appropriate, Sharing, and Motivated  Motor:  Uses cane to walk  Speech/Language:   Clear and Coherent  Affect:  anxious  Mood:  anxious and depressed  Thought process:  goal directed  Thought content:    Rumination and obsessive thoughts  Sensory/Perceptual disturbances:    WNL  Orientation:  oriented to person, place, time/date, situation, day of week, month of year, year, and stated date of Dec. 7, 2023  Attention:  Good  Concentration:  Good and Fair  Memory:  WNL   Fund of knowledge:   Good  Insight:    Good and Fair  Judgment:   Good  Impulse Control:  Good   Risk Assessment: Danger to Self:  No Self-injurious Behavior: No Danger to Others: No Duty to Warn:no Physical Aggression / Violence:No  Access to Firearms a concern: No  Gang Involvement:No   Subjective: Patient today anxious re: increased cases of flu and some covid. Frustrated over Thanksgiving situation and family issues which we processed in session today. Also worked on some of her grief re: loss of husband. Supported patient in her expressing and talking through her difficult emotions that increased during holiday time. Concerned about some family dynamics and did well in discussing them and realizing a need to have healthier boundaries. Assisted patient in developing some healthy boundaries (especially within  extended family) that she could be helpful and her feel comfortable using.   Interventions: Cognitive Behavioral Therapy, Solution-Oriented/Positive Psychology, and Ego-Supportive  Long term goal: Reduce overall level, frequency, and intensity of the anxiety so that daily functioning is not impaired.  Short term goal: Increase  understanding of beliefs and messages that lead to worry and anxiety.  Patient will share her understanding in written form or verbally in sessions, or both. Strategy: Identify, challenge, and replace anxious or negative self-talk with positive, realistic, and empowering self-talk.  Diagnosis:   ICD-10-CM   1. Generalized anxiety disorder  F41.1      Plan: Patient today actively participating in session sitting on her anxiety and worrying mostly around her family and extended family and some of her own health issues.  Did well in talking through these and looking at ways to better manage her anxiety and stress.  Encouraged her to use some journaling as she is able.  Also discouraged the watching of a lot of TV focusing on violence and excessive political issues as this tends to heighten her anxiety.  Worked well with establishing some better boundaries especially within family relationships.  Also having some increase in grief that she feels is because of the holidays approaching and did well in expressing that and getting some support today.  Was more calm and grounded upon leaving session. Encouraged patient in practicing more positive behaviors as noted in session including spending time with at least 2 of her friends who are supportive of her and do not add to her anxiety as some of her other friends do, having more calm moments to herself and being able to use some deep breathing exercises to feel less stressed or anxious, using prayers and meditations for support, walking some as she is able, believing more in herself and getting through difficult times, staying  in touch with people who care about her, focus on some of her progress already made, limit how much she watches world news that tends to be distressing and disturbing as this adds to her anxiety, take occasional breaks from her electronics, intentionally look for more positives versus negatives daily, stay in the present focusing on what she  can change her control, finding the positives within herself, refrain from assuming worst-case scenarios, and realize the strength she shows when working with goal-directed behaviors to move in a direction that supports her improved emotional health.  Goal review and progress/challenges noted with patient.  Next appointment within 1 month.  This record has been created using [employer].  Chart creation errors have been sought, but may not always have been located and corrected.  Such creation errors do not reflect on the standard of medical care provided.   Shanon Ace, LCSW

## 2022-08-31 DIAGNOSIS — M0589 Other rheumatoid arthritis with rheumatoid factor of multiple sites: Secondary | ICD-10-CM | POA: Diagnosis not present

## 2022-08-31 DIAGNOSIS — M1991 Primary osteoarthritis, unspecified site: Secondary | ICD-10-CM | POA: Diagnosis not present

## 2022-08-31 DIAGNOSIS — M5136 Other intervertebral disc degeneration, lumbar region: Secondary | ICD-10-CM | POA: Diagnosis not present

## 2022-08-31 DIAGNOSIS — R600 Localized edema: Secondary | ICD-10-CM | POA: Diagnosis not present

## 2022-08-31 DIAGNOSIS — G4709 Other insomnia: Secondary | ICD-10-CM | POA: Diagnosis not present

## 2022-08-31 DIAGNOSIS — M722 Plantar fascial fibromatosis: Secondary | ICD-10-CM | POA: Diagnosis not present

## 2022-08-31 DIAGNOSIS — M25551 Pain in right hip: Secondary | ICD-10-CM | POA: Diagnosis not present

## 2022-09-25 DIAGNOSIS — N952 Postmenopausal atrophic vaginitis: Secondary | ICD-10-CM | POA: Diagnosis not present

## 2022-09-25 DIAGNOSIS — Z01419 Encounter for gynecological examination (general) (routine) without abnormal findings: Secondary | ICD-10-CM | POA: Diagnosis not present

## 2022-09-25 DIAGNOSIS — Z6831 Body mass index (BMI) 31.0-31.9, adult: Secondary | ICD-10-CM | POA: Diagnosis not present

## 2022-10-02 ENCOUNTER — Ambulatory Visit: Payer: PPO | Admitting: Psychiatry

## 2022-10-05 ENCOUNTER — Ambulatory Visit: Payer: PPO | Attending: Cardiovascular Disease | Admitting: Cardiovascular Disease

## 2022-10-05 ENCOUNTER — Encounter: Payer: Self-pay | Admitting: Cardiovascular Disease

## 2022-10-05 VITALS — BP 120/68 | HR 76 | Wt 146.0 lb

## 2022-10-05 DIAGNOSIS — R0609 Other forms of dyspnea: Secondary | ICD-10-CM

## 2022-10-05 DIAGNOSIS — E7800 Pure hypercholesterolemia, unspecified: Secondary | ICD-10-CM | POA: Diagnosis not present

## 2022-10-05 DIAGNOSIS — I255 Ischemic cardiomyopathy: Secondary | ICD-10-CM | POA: Diagnosis not present

## 2022-10-05 DIAGNOSIS — E782 Mixed hyperlipidemia: Secondary | ICD-10-CM

## 2022-10-05 DIAGNOSIS — I2510 Atherosclerotic heart disease of native coronary artery without angina pectoris: Secondary | ICD-10-CM

## 2022-10-05 DIAGNOSIS — M069 Rheumatoid arthritis, unspecified: Secondary | ICD-10-CM | POA: Diagnosis not present
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
## 2022-10-05 NOTE — Patient Instructions (Signed)
Medication Instructions:  The current medical regimen is effective;  continue present plan and medications.  *If you need a refill on your cardiac medications before your next appointment, please call your pharmacy*  Testing/Procedures: Your physician has requested that you have an echocardiogram. Echocardiography is a painless test that uses sound waves to create images of your heart. It provides your doctor with information about the size and shape of your heart and how well your heart's chambers and valves are working. This procedure takes approximately one hour. There are no restrictions for this procedure. Please do NOT wear cologne, perfume, aftershave, or lotions (deodorant is allowed). Please arrive 15 minutes prior to your appointment time.  Follow-Up: At [facility], you and your health needs are our priority.  As part of our continuing mission to provide you with exceptional heart care, we have created designated Provider Care Teams.  These Care Teams include your primary Cardiologist (physician) and Advanced Practice Providers (APPs -  Physician Assistants and Nurse Practitioners) who all work together to provide you with the care you need, when you need it.  We recommend signing up for the patient portal called "MyChart".  Sign up information is provided on this After Visit Summary.  MyChart is used to connect with patients for Virtual Visits (Telemedicine).  Patients are able to view lab/test results, encounter notes, upcoming appointments, etc.  Non-urgent messages can be sent to your provider as well.   To learn more about what you can do with MyChart, go to [email or web address].    Your next appointment:   1 year(s)  Provider:   Sherren Mocha, MD

## 2022-10-09 ENCOUNTER — Ambulatory Visit: Payer: PPO | Admitting: Psychiatry

## 2022-10-09 DIAGNOSIS — F411 Generalized anxiety disorder: Secondary | ICD-10-CM | POA: Diagnosis not present

## 2022-10-09 NOTE — Progress Notes (Signed)
[facility] Counselor/Therapist Progress Note  Patient ID: Andrea Ibarra, MRN: [id number],    Date: 10/09/2022  Time Spent: 55 minutes   Treatment Type: Individual Therapy  Reported Symptoms: anxiety  Mental Status Exam:  Appearance:   Casual     Behavior:  Appropriate, Sharing, and Motivated  Motor:  Uses can to walk  Speech/Language:   Clear and Coherent  Affect:  anxious  Mood:  anxious  Thought process:  goal directed  Thought content:    Rumination  Sensory/Perceptual disturbances:    WNL  Orientation:  oriented to person, place, time/date, situation, day of week, month of year, year, and stated date of Jan. 22, 2024  Attention:  Good  Concentration:  Good  Memory:  Some short term memory issues  Fund of knowledge:   Good  Insight:    Good and Fair  Judgment:   Good  Impulse Control:  Good   Risk Assessment: Danger to Self:  No Self-injurious Behavior: No Danger to Others: No Duty to Warn:no Physical Aggression / Violence:No  Access to Firearms a concern: No  Gang Involvement:No   Subjective:  Patient in session today reporting anxiety as main symptom. States biggest stressors are her family, 2 adult kids. Worried that her son and family have not had any interaction with patient recently due to Covid and with some of family they've had Covid twice. Trying to be around people more at her retirement center. Anxious about illnesses going around. Today needing to work on some heightened anxiety and stress within her family and particularly some specific dynamics within the family, as well as the loss of a good friend to cancer this past week. Talked through her concerns re: her friend that died pretty quickly even though she had had cancer several months.  Patient able to talk through her thoughts and feelings not just about the lady's death but also about how much their friendship over the years had meant to her and her family.  Very on guard about COVID and flu, and  reports that she is being careful to try and minimize her chances of being around contagious people.   Interventions: Cognitive Behavioral Therapy and Ego-Supportive  Long term goal: Reduce overall level, frequency, and intensity of the anxiety so that daily functioning is not impaired.  Short term goal: Increase understanding of beliefs and messages that lead to worry and anxiety.  Patient will share her understanding in written form or verbally in sessions, or both. Strategy: Identify, challenge, and replace anxious or negative self-talk with positive, realistic, and empowering self-talk.  Diagnosis:   ICD-10-CM   1. Generalized anxiety disorder  F41.1      Plan:  Patient in today showing good motivation and active participation in session as she worked further on managing stress more effectively.  Patient reports her anxiety and stress is heightened due to family illnesses and repeated illnesses, and also the loss of a dear friend to cancer who is a Industrial/product designer of patient.  Did well and working on these issues in session today, as noted above.  She continues to make progress and needs to remain active with her goal-directed behaviors so as to continue moving forward in a positive direction of better managing anxiety, decreasing her overall worrying, and more consistent stress management.  Discouraged her from watching excessive amounts of TV especially where violence is involved as this tends to heighten her anxiety.  Discussed further some healthier boundaries to have within her  family as well as some of her friendships. Encouraged patient in her practice of more positive behaviors as discussed in session including: Spending time with at least 2 of her friends who are supportive of her and do not add to her anxiety as some other friends do, having more calm moments to herself and being able to use some deep breathing exercises to feel less stressed or anxious, using prayers and meditations for  support which she has acknowledged before helps, walking some as she is able, believing more in herself and getting through difficult times, staying in touch with people who care about her, recognize the progress she has already made, limit how much she watches world news that tends to be distressing and disturbing for her and adds to her anxiety, take occasional breaks from her electronics, intentionally look for more positives versus negatives each day, stay in the present focusing on what she can change or control, finding the positives within herself, refrain from assuming worst-case scenarios, and recognize the strength she shows when working with goal-directed behaviors to move in a direction that supports her improved emotional health and outlook.  Goal review and progress/challenges noted with patient.  Next appointment within approximately 5 to 6 weeks.  This record has been created using [employer].  Chart creation errors have been sought, but may not always have been located and corrected.  Such creation errors do not reflect on the standard of medical care provided.   Shanon Ace, LCSW

## 2022-10-19 DIAGNOSIS — E7800 Pure hypercholesterolemia, unspecified: Secondary | ICD-10-CM | POA: Diagnosis not present

## 2022-10-19 DIAGNOSIS — E559 Vitamin D deficiency, unspecified: Secondary | ICD-10-CM | POA: Diagnosis not present

## 2022-10-19 DIAGNOSIS — H9319 Tinnitus, unspecified ear: Secondary | ICD-10-CM | POA: Diagnosis not present

## 2022-10-19 DIAGNOSIS — I10 Essential (primary) hypertension: Secondary | ICD-10-CM | POA: Diagnosis not present

## 2022-10-31 ENCOUNTER — Ambulatory Visit (HOSPITAL_COMMUNITY): Payer: PPO | Attending: Cardiovascular Disease

## 2022-10-31 DIAGNOSIS — I255 Ischemic cardiomyopathy: Secondary | ICD-10-CM

## 2022-10-31 DIAGNOSIS — R0609 Other forms of dyspnea: Secondary | ICD-10-CM | POA: Diagnosis not present

## 2022-10-31 LAB — ECHOCARDIOGRAM COMPLETE
Area-P 1/2: 2.88 cm2
S' Lateral: 2.5 cm

## 2022-11-13 ENCOUNTER — Ambulatory Visit: Payer: PPO | Admitting: Psychiatry

## 2022-11-13 DIAGNOSIS — F411 Generalized anxiety disorder: Secondary | ICD-10-CM | POA: Diagnosis not present

## 2022-11-13 NOTE — Progress Notes (Signed)
[facility] Counselor/Therapist Progress Note  Patient ID: Andrea Ibarra, MRN: [id number],    Date: 11/13/2022  Time Spent: 50 minutes   Treatment Type: Individual Therapy  Reported Symptoms:  anxiety  Mental Status Exam:  Appearance:   Casual     Behavior:  Appropriate, Sharing, and Motivated  Motor:  Normal  Speech/Language:   Clear and Coherent  Affect:  anxious  Mood:  anxious  Thought process:  goal directed  Thought content:    Rumination  Sensory/Perceptual disturbances:    WNL  Orientation:  oriented to person, place, time/date, situation, day of week, month of year, year, and stated date of Feb. 26, 2024  Attention:  Good  Concentration:  Good  Memory:  Some short term memory issues and Dr is aware  Fund of knowledge:   Good  Insight:    Good  Judgment:   Good  Impulse Control:  Good   Risk Assessment: Danger to Self:  No Self-injurious Behavior: No Danger to Others: No Duty to Warn:no Physical Aggression / Violence:No  Access to Firearms a concern: No  Gang Involvement:No   Subjective:  Patient in today reporting anxiety about her and her family, "the world" situation. Very concerned about family members and processed her "worries" today in session including multiple issues with multiple family members. Also talked through some issues at her retirement community which she agreed "there's more positive than negatives."  Specifically worked on her anxiety and the things that trigger her the most, looking at what she can control and what she cannot control. Encouraged her in becoming more aware of her anxiety and trying to practice strategies as discussed in session today to reduce her anxiety and tendency to fear and assume worst worst case scenarios. Did well in working on this today and is motivated "but hard to change "although wanting to make changes.  Did suggest to her at session end about making some smaller changes that could eventually grow into bigger  changes which seemed more do-able to her.  Is managing better after the loss of her friend who had cancer, that was discussed at previous session.  Some anxiety revolving around COVID and flu but is not avoiding other people nor isolating.  Interventions: Cognitive Behavioral Therapy and Ego-Supportive  Long term goal: Reduce overall level, frequency, and intensity of the anxiety so that daily functioning is not impaired.  Short term goal: Increase understanding of beliefs and messages that lead to worry and anxiety.  Patient will share her understanding in written form or verbally in sessions, or both. Strategy: Identify, challenge, and replace anxious or negative self-talk with positive, realistic, and empowering self-talk.  Diagnosis:   ICD-10-CM   1. Generalized anxiety disorder  F41.1      Plan:  Patient actively participating in session today and showing good motivation as she worked on her anxiety and trying to "tone it down" a bit.  Is able to talk about it quite well but hard to follow through and actions.  Has made some progress and not dwelling on things quite as much and that is a positive.  Needs to continue working with goal-directed behaviors in order to continue her progress and moving forward. Encouraged patient and practicing more positive and self affirming behaviors as discussed in session including: Spending time with at least 2 of her friends who do not add to her anxiety, have more calm moments to herself and being able to use some deep breathing exercises  to feel less stressed, using prayers and meditations for support which she has acknowledged before helps, walking as she is able, believing more in herself and getting through difficult times, staying in touch with people who care about her, recognize the progress she has already made, limit how much she watches world news that tends to be distressing and disturbing for her and adds to her anxiety, take occasional breaks from  her electronics, intentionally look for more positives versus negatives each day, stay in the present focusing on what she can change or control, finding the positives within herself, refrain from assuming worst-case scenarios, and realize the strength she shows when working with goal-directed behaviors to move in a direction that supports her improved emotional health and overall wellbeing.  Review and progress/challenges noted with patient.  Next appointment within 1 month.  This record has been created using [employer].  Chart creation errors have been sought, but may not always have been located and corrected.  Such creation errors do not reflect on the standard of medical care provided.   Shanon Ace, LCSW

## 2022-11-20 DIAGNOSIS — S90211A Contusion of right great toe with damage to nail, initial encounter: Secondary | ICD-10-CM | POA: Diagnosis not present

## 2022-11-20 DIAGNOSIS — L603 Nail dystrophy: Secondary | ICD-10-CM | POA: Diagnosis not present

## 2022-12-25 ENCOUNTER — Ambulatory Visit: Payer: PPO | Admitting: Psychiatry

## 2023-01-08 ENCOUNTER — Ambulatory Visit: Payer: PPO | Admitting: Psychiatry

## 2023-01-08 DIAGNOSIS — F411 Generalized anxiety disorder: Secondary | ICD-10-CM
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
## 2023-01-16 DIAGNOSIS — D2271 Melanocytic nevi of right lower limb, including hip: Secondary | ICD-10-CM | POA: Diagnosis not present

## 2023-01-16 DIAGNOSIS — L821 Other seborrheic keratosis: Secondary | ICD-10-CM | POA: Diagnosis not present

## 2023-01-16 DIAGNOSIS — L814 Other melanin hyperpigmentation: Secondary | ICD-10-CM | POA: Diagnosis not present

## 2023-01-16 DIAGNOSIS — D224 Melanocytic nevi of scalp and neck: Secondary | ICD-10-CM | POA: Diagnosis not present

## 2023-01-16 DIAGNOSIS — D1801 Hemangioma of skin and subcutaneous tissue: Secondary | ICD-10-CM | POA: Diagnosis not present

## 2023-01-16 DIAGNOSIS — L720 Epidermal cyst: Secondary | ICD-10-CM | POA: Diagnosis not present

## 2023-02-01 DIAGNOSIS — Z961 Presence of intraocular lens: Secondary | ICD-10-CM | POA: Diagnosis not present

## 2023-02-01 DIAGNOSIS — H52203 Unspecified astigmatism, bilateral: Secondary | ICD-10-CM | POA: Diagnosis not present

## 2023-02-15 DIAGNOSIS — H109 Unspecified conjunctivitis: Secondary | ICD-10-CM | POA: Diagnosis not present

## 2023-02-15 DIAGNOSIS — J209 Acute bronchitis, unspecified: Secondary | ICD-10-CM | POA: Diagnosis not present

## 2023-02-15 DIAGNOSIS — R051 Acute cough: Secondary | ICD-10-CM | POA: Diagnosis not present

## 2023-02-19 ENCOUNTER — Ambulatory Visit: Payer: PPO | Admitting: Psychiatry

## 2023-02-26 ENCOUNTER — Ambulatory Visit: Payer: PPO | Admitting: Psychiatry

## 2023-02-26 DIAGNOSIS — J40 Bronchitis, not specified as acute or chronic: Secondary | ICD-10-CM | POA: Diagnosis not present

## 2023-02-26 DIAGNOSIS — H109 Unspecified conjunctivitis: Secondary | ICD-10-CM | POA: Diagnosis not present

## 2023-02-26 DIAGNOSIS — B370 Candidal stomatitis: Secondary | ICD-10-CM | POA: Diagnosis not present

## 2023-02-26 DIAGNOSIS — Z09 Encounter for follow-up examination after completed treatment for conditions other than malignant neoplasm: Secondary | ICD-10-CM | POA: Diagnosis not present

## 2023-02-26 DIAGNOSIS — I2510 Atherosclerotic heart disease of native coronary artery without angina pectoris: Secondary | ICD-10-CM | POA: Diagnosis not present

## 2023-03-05 DIAGNOSIS — B370 Candidal stomatitis: Secondary | ICD-10-CM | POA: Diagnosis not present

## 2023-03-05 DIAGNOSIS — R053 Chronic cough: Secondary | ICD-10-CM | POA: Diagnosis not present

## 2023-03-05 DIAGNOSIS — J40 Bronchitis, not specified as acute or chronic: Secondary | ICD-10-CM | POA: Diagnosis not present

## 2023-03-05 DIAGNOSIS — H109 Unspecified conjunctivitis: Secondary | ICD-10-CM | POA: Diagnosis not present

## 2023-03-05 DIAGNOSIS — R5383 Other fatigue: Secondary | ICD-10-CM | POA: Diagnosis not present

## 2023-03-13 ENCOUNTER — Ambulatory Visit: Payer: PPO | Admitting: Psychiatry

## 2023-03-13 DIAGNOSIS — F411 Generalized anxiety disorder: Secondary | ICD-10-CM | POA: Diagnosis not present

## 2023-03-13 NOTE — Progress Notes (Signed)
[facility] Counselor/Therapist Progress Note  Patient ID: Andrea Ibarra, MRN: [id number],    Date: 03/13/2023  Time Spent: 50 minutes   Treatment Type: Individual Therapy  Reported Symptoms: anxiety, anger, some depression  Mental Status Exam:  Appearance:   Casual     Behavior:  Appropriate, Sharing, and Motivated  Motor:  Normal but does use cane in walking  Speech/Language:   Clear and Coherent  Affect:  Depressed and anxious  Mood:  anxious and depressed  Thought process:  goal directed  Thought content:    Rumination  Sensory/Perceptual disturbances:    WNL  Orientation:  oriented to person, place, time/date, situation, day of week, month of year, year, and stated date of Andrea Ibarra 25, 2024  Attention:  Good  Concentration:  Good  Memory:  Some short term memory issues and Dr is aware  Fund of knowledge:   Good  Insight:    Good and Fair  Judgment:   Good  Impulse Control:  Good   Risk Assessment: Danger to Self:  No Self-injurious Behavior: No Danger to Others: No Duty to Warn:no Physical Aggression / Violence:No  Access to Firearms a concern: No  Gang Involvement:No   Subjective: Patient in today and reporting anxiety, anger, and depression and "feeling overwhelmed at times." Today she reports needing to share "big concerns that related to my family and church."  Shared openly her anger, anxiousness, and some depression re: several family concerns with her adult children and grandchildren.  (Not all details included in this note due to patient privacy needs.)  Patient was able to explain openly her concerns especially within the family but feeling torn knowing she really should not intervene but also hard to step back away from situations going on. She did say that no one is in any kind of danger from the situations and that the current conflict involves messages/notes/verbal communication primarily amongst 2 separate family members "but because this is within the  family, it extends beyond that".  The more she talked, the more calm patient became.  Also seem to realize that none of this information was firsthand information for her and that she was hearing it from other sources, and therefore she does not know for sure what has actually been set or not said.  Encouraged her processing of her feelings and also the realization that it is something that really does not involve her specifically and she added I noted the best thing to do is to back off and let the issue remain between the family members who are actually involved.  Patient talked further about how difficult it is for her when she knows things are not "all peaceful" amongst her family members, and also looked at how all families at some point in time have some relationship or communication difficulties that they need to work through and, based on history, it sounds like her family is one that is able to work through Fore[employer] psychologist.  Patient was much calmer by end of session and seem to feel more grounded in knowing that this is not something "I have to fix" but rather let the adults involved work on resolution.  Shared that this situation did aggravate her anxiety and some depression, but made it clear that that she does not want medication but wants to work these type of issues out in therapy.  Interventions: Cognitive Behavioral Therapy and Ego-Supportive  Long term goal: Reduce overall level, frequency, and intensity of the anxiety so  that daily functioning is not impaired.  Short term goal: Increase understanding of beliefs and messages that lead to worry and anxiety.  Patient will share her understanding in written form or verbally in sessions, or both. Strategy: Identify, challenge, and replace anxious or negative self-talk with positive, realistic, and empowering self-talk.  Diagnosis:   ICD-10-CM   1. Generalized anxiety disorder  F41.1      Plan: Patient in today working on reported symptoms of  anxiety, anger, and some depression and feeling overwhelmed at times.  Symptoms are directly related she reports to "big concerns related to my family and church".  She was able to share details of the situation about which she is still concerned and as noted above seem to feel much calmer and less of a need to "fix things", and rather letting the adults who are actively involved in the situation work on the resolution.  Patient very actively involved today in session and is reporting and showing some progress.  She needs to continue her work with goal-directed behaviors to keep moving in a forward direction.Encouraged patient in her practice of more positive and self affirming behaviors as noted in session including: Have more calm moments for herself and be able to use some deep breathing exercises to feel less stressed and more grounded, spend time with at least 2 of her friends daily who do not add to her anxiety, using prayers and meditation for support, walking as she is able, believing more in herself and her ability to get through difficult times, staying in touch with people who care about her, recognize the progress she has already made, limit how much she watches world news as that tends to be distressing and disturbing and adds to her anxiety, take occasional breaks from her electronics, intentionally look for more positives versus negatives daily, stay in the present focusing on what she can change or control, refrain from assuming worst-case scenarios, and recognize the strength she shows working with goal-directed behaviors to move in a direction that supports her improved emotional health and overall wellbeing.  Self rating scales: 1-10 depression scale-3 1-10 anxiety scale-8 1-10 self-esteem scale-7 1-10 motivation scale-6 1-10 hopefulness scale-7  Goal review and progress/challenges noted with patient.  Next appointment within 1 month.  Mathis Fare,  LCSW

## 2023-03-15 DIAGNOSIS — M25551 Pain in right hip: Secondary | ICD-10-CM | POA: Diagnosis not present

## 2023-03-15 DIAGNOSIS — M1991 Primary osteoarthritis, unspecified site: Secondary | ICD-10-CM | POA: Diagnosis not present

## 2023-03-15 DIAGNOSIS — R600 Localized edema: Secondary | ICD-10-CM | POA: Diagnosis not present

## 2023-03-15 DIAGNOSIS — E663 Overweight: Secondary | ICD-10-CM | POA: Diagnosis not present

## 2023-03-15 DIAGNOSIS — M5136 Other intervertebral disc degeneration, lumbar region: Secondary | ICD-10-CM | POA: Diagnosis not present

## 2023-03-15 DIAGNOSIS — G4709 Other insomnia: Secondary | ICD-10-CM | POA: Diagnosis not present

## 2023-03-15 DIAGNOSIS — Z6828 Body mass index (BMI) 28.0-28.9, adult: Secondary | ICD-10-CM | POA: Diagnosis not present

## 2023-03-15 DIAGNOSIS — M0589 Other rheumatoid arthritis with rheumatoid factor of multiple sites: Secondary | ICD-10-CM | POA: Diagnosis not present

## 2023-03-15 DIAGNOSIS — M722 Plantar fascial fibromatosis: Secondary | ICD-10-CM | POA: Diagnosis not present

## 2023-03-19 DIAGNOSIS — H1045 Other chronic allergic conjunctivitis: Secondary | ICD-10-CM | POA: Diagnosis not present

## 2023-03-19 DIAGNOSIS — J3089 Other allergic rhinitis: Secondary | ICD-10-CM | POA: Diagnosis not present

## 2023-03-19 DIAGNOSIS — J300 Vasomotor rhinitis: Secondary | ICD-10-CM | POA: Diagnosis not present

## 2023-03-19 DIAGNOSIS — J45991 Cough variant asthma: Secondary | ICD-10-CM | POA: Diagnosis not present

## 2023-03-29 ENCOUNTER — Ambulatory Visit: Payer: PPO | Admitting: Psychiatry

## 2023-03-29 DIAGNOSIS — F411 Generalized anxiety disorder: Secondary | ICD-10-CM

## 2023-03-29 NOTE — Progress Notes (Signed)
[facility] Counselor/Therapist Progress Note  Patient ID: Andrea Ibarra, MRN: [id number],    Date: 03/29/2023  Time Spent: 53 minutes  Treatment Type: Individual Therapy  Reported Symptoms: anxiety, some depression   Mental Status Exam:  Appearance:   Casual     Behavior:  Appropriate, Sharing, and Motivated  Motor:  Uses cane in walking  Speech/Language:   Clear and Coherent  Affect:  Anxious, some depression  Mood:  anxious and depressed  Thought process:  goal directed  Thought content:    Rumination and some obsessiveness  Sensory/Perceptual disturbances:    WNL  Orientation:  oriented to person, place, time/date, situation, day of week, month of year, year, and stated date of March 29, 2023  Attention:  Good  Concentration:  Good  Memory:  Some short term memory issues and Dr is aware  Fund of knowledge:   Good  Insight:    Good  Judgment:   Good  Impulse Control:  Good   Risk Assessment: Danger to Self:  No Self-injurious Behavior: No Danger to Others: No Duty to Warn:no Physical Aggression / Violence:No  Access to Firearms a concern: No  Gang Involvement:No   Subjective:  Patient in today and reporting some improvement in family situation discussed some previously. Very concerned about some other issues within her family and adult children more recently.  (Not all details included in this note due to patient privacy needs.) Best friend from her retirement center is moving away to be at another retirement center closer to her daughter in [place].  Was able to process her feelings of loss of her friend moving away "as we ate all the meals together each day." Son recently retired from police department after 25 yrs. Family issues are a big concern to her, "and I worry about my children and grandchildren" and went to to explain 2 situations that are of most concern for her currently. Overly comparing family members with others.  Concerns with aging discussed at  length in addition to trying to be less judgmental of others who some ideas and beliefs than patient, and was able to process each of these with patient to help her feel more heard, understood, and look at some changes that she feels she may want to consider in each of these areas.  Does feel that her anxiety has decreased some when she is around more people her age, particularly at her retirement community.  Interventions: Cognitive Behavioral Therapy and Ego-Supportive  Long term goal: Reduce overall level, frequency, and intensity of the anxiety so that daily functioning is not impaired.  Short term goal: Increase understanding of beliefs and messages that lead to worry and anxiety.  Patient will share her understanding in written form or verbally in sessions, or both. Strategy: Identify, challenge, and replace anxious or negative self-talk with positive, realistic, and empowering self-talk.  Diagnosis:   ICD-10-CM   1. Generalized anxiety disorder  F41.1      Plan: Patient in for session today working further on her anxiety, family concerns, loss of a friend, and in talking through some confidential issues about which she has very strong beliefs.  Worked well in session today and was very open about some issues that are difficult for her.  Was noticeably calmer by session end, including being able to laugh a little bit at herself which is healthy for her.  Patient is making progress and needs to continue working with goal-directed behaviors to keep moving in  a forward direction. Encouraged patient in her practice of more positive and self affirming behaviors as noted in session including: Having more calm moments for herself and be able to use some deep breathing exercises to feel less stressed and more grounded, spending time with at least 2 of her friends daily who do not add to her anxiety, using prayers and meditation for support, walking as she is able, believing more in herself and her  ability to get through difficult times, staying in contact with people who care about her, recognize the progress she has already made, limit how much she watches world news as that tends to be distressing and disturbing for her and feeds her anxiety, take occasional breaks from her electronics, intentionally look for more positives versus negatives daily, stay in the present focusing on what she can change or control, refrain from assuming worst-case scenarios, and realize the strength she shows working with goal-directed behaviors to move in a direction that supports her improved emotional health and outlook.  Self rating scales: (March 29, 2023) 1-10 depression scale-2 1-10 anxiety scale-8 1-10 self-esteem scale-7 1-10 motivation scale-6/7 1-10 hopefulness scale-7/8  Goal review and progress/challenges noted with patient.  Next appointment within 3 to 4 weeks.   Mathis Fare, LCSW

## 2023-04-11 DIAGNOSIS — N390 Urinary tract infection, site not specified: Secondary | ICD-10-CM | POA: Diagnosis not present

## 2023-04-11 DIAGNOSIS — E7800 Pure hypercholesterolemia, unspecified: Secondary | ICD-10-CM | POA: Diagnosis not present

## 2023-04-11 DIAGNOSIS — E559 Vitamin D deficiency, unspecified: Secondary | ICD-10-CM | POA: Diagnosis not present

## 2023-04-17 DIAGNOSIS — I10 Essential (primary) hypertension: Secondary | ICD-10-CM | POA: Diagnosis not present

## 2023-04-17 DIAGNOSIS — E559 Vitamin D deficiency, unspecified: Secondary | ICD-10-CM | POA: Diagnosis not present

## 2023-04-17 DIAGNOSIS — Z0000 Encounter for general adult medical examination without abnormal findings: Secondary | ICD-10-CM | POA: Diagnosis not present

## 2023-04-17 DIAGNOSIS — I2510 Atherosclerotic heart disease of native coronary artery without angina pectoris: Secondary | ICD-10-CM | POA: Diagnosis not present

## 2023-04-17 DIAGNOSIS — E7800 Pure hypercholesterolemia, unspecified: Secondary | ICD-10-CM | POA: Diagnosis not present

## 2023-04-26 ENCOUNTER — Ambulatory Visit: Payer: PPO | Admitting: Psychiatry

## 2023-05-10 ENCOUNTER — Ambulatory Visit: Payer: PPO | Admitting: Psychiatry

## 2023-05-10 DIAGNOSIS — F411 Generalized anxiety disorder: Secondary | ICD-10-CM | POA: Diagnosis not present

## 2023-05-10 NOTE — Progress Notes (Signed)
[facility] Counselor/Therapist Progress Note  Patient ID: Jayah JAZMIN CRESPIN, MRN: [id number],    Date: 05/10/2023  Time Spent: 50 minutes   Treatment Type: Individual Therapy  Reported Symptoms: anxiety  Mental Status Exam:  Appearance:   Neat     Behavior:  Appropriate, Sharing, and Motivated  Motor:  Normal  Speech/Language:   Clear and Coherent  Affect:  anxious  Mood:  anxious  Thought process:  goal directed  Thought content:    Obsessive thoughts  Sensory/Perceptual disturbances:    WNL  Orientation:  oriented to person, place, time/date, situation, day of week, month of year, year, and stated date of Aug. 22, 2024  Attention:  Fair  Concentration:  Fair  Memory:  "Some age-related forgetting"  Fund of knowledge:   Good and Fair  Insight:    Good and Fair  Judgment:   Good  Impulse Control:  Good   Risk Assessment: Danger to Self:  No Self-injurious Behavior: No Danger to Others: No Duty to Warn:no Physical Aggression / Violence:No       Access to Firearms a concern: No  Gang Involvement:No   Subjective: Patient in today anxiously talking and sharing about her recent having Covid again. Is currently testing "negative" and states she is feeling better. Talking steadily about health and her having Covid and "still being careful". Difficulty hearing and encouraged her to follow through in getting her hearing tested. Family situations stressful at times but there has been some changes for the better, and also some more recent challenges. (Not all details included due to patient privacy needs.) Concerns expressed about some of her family members which she shared and talked through in session today, seeming to feel less anxious at the end of session.  Is managing better regarding the loss of a friend at her retirement center who moved several states away to be closer to family at another long-term care facility.  Spoke probably of her son today who recently retired from the  [employer]  Continues to have distress at times about aging and trying to be less judgmental of others whose thoughts and ideas differ from hers.  Reports that her anxiety has decreased some although definitely seemed to be present at the onset of session and through a good part of the session.  Some of that may have been due to a lot of excessive talking as she does not always feel heard by others with which she spends a lot of time as they tend to talk more also, and some of it could be age and environment related, and added that she does not feel she needs anxiety medication.  Encouraged her continued involvement at her retirement home and staying in good contact with friends she has there and others outside of that facility.  Interventions: Cognitive Behavioral Therapy and Ego-Supportive  Long term goal: Reduce overall level, frequency, and intensity of the anxiety so that daily functioning is not impaired.  Short term goal: Increase understanding of beliefs and messages that lead to worry and anxiety.  Patient will share her understanding in written form or verbally in sessions, or both. Strategy: Identify, challenge, and replace anxious or negative self-talk with positive, realistic, and empowering self-talk.  Diagnosis:   ICD-10-CM   1. Generalized anxiety disorder  F41.1      Plan:  Patient today in session focusing on her family concerns and her anxiety. Initially talking steadily and sharing her anxieties and concerns. Has made  progress and needs to continue working with goal-directed behaviors to keep moving in a more hopeful and healthy direction. Encouraged patient in her practice of more positive and self affirming behaviors as noted in session including: Spending time with at least one of her friends daily who does not add to her anxiety, having more calm moments for herself and be able to use some deep breathing exercises to feel less stressed and more grounded, using  prayers and meditation for support, walking in the hallways of her retirement home as she is able, believing more in herself and her ability to get through difficult times, staying in contact with people who care about her, recognize the progress she has already made, limit how much she watches world news as that tends to be distressing and feeds her anxiety, take occasional breaks from her electronics, stay in the present focused on what she can control or change, refrain from assuming worst-case scenarios, and recognize the strengths she shows working with goal-directed behaviors to move in a direction that supports her improved emotional health and overall wellbeing.  Goal review and progress/challenges noted with patient.  Next appointment within 4 to 5 weeks.  Mathis Fare, LCSW

## 2023-05-14 DIAGNOSIS — G4709 Other insomnia: Secondary | ICD-10-CM | POA: Diagnosis not present

## 2023-05-14 DIAGNOSIS — R600 Localized edema: Secondary | ICD-10-CM | POA: Diagnosis not present

## 2023-05-14 DIAGNOSIS — M5136 Other intervertebral disc degeneration, lumbar region: Secondary | ICD-10-CM | POA: Diagnosis not present

## 2023-05-14 DIAGNOSIS — Z6829 Body mass index (BMI) 29.0-29.9, adult: Secondary | ICD-10-CM | POA: Diagnosis not present

## 2023-05-14 DIAGNOSIS — E663 Overweight: Secondary | ICD-10-CM | POA: Diagnosis not present

## 2023-05-14 DIAGNOSIS — M1991 Primary osteoarthritis, unspecified site: Secondary | ICD-10-CM | POA: Diagnosis not present

## 2023-05-14 DIAGNOSIS — M0589 Other rheumatoid arthritis with rheumatoid factor of multiple sites: Secondary | ICD-10-CM | POA: Diagnosis not present

## 2023-05-14 DIAGNOSIS — M722 Plantar fascial fibromatosis: Secondary | ICD-10-CM | POA: Diagnosis not present

## 2023-05-14 DIAGNOSIS — M25551 Pain in right hip: Secondary | ICD-10-CM | POA: Diagnosis not present

## 2023-06-21 ENCOUNTER — Ambulatory Visit: Payer: PPO | Admitting: Psychiatry

## 2023-06-21 DIAGNOSIS — F411 Generalized anxiety disorder: Secondary | ICD-10-CM | POA: Diagnosis not present

## 2023-06-21 NOTE — Progress Notes (Signed)
[facility] Counselor/Therapist Progress Note  Patient ID: Andrea Ibarra, MRN: [id number],    Date: 06/21/2023  Time Spent: 55 minutes   Treatment Type: Individual Therapy  Reported Symptoms: anxiety  Mental Status Exam:  Appearance:   Casual     Behavior:  Appropriate, Sharing, and Motivated  Motor:  Uses cane to walk  Speech/Language:   Clear and Coherent  Affect:  anxious  Mood:  anxious  Thought process:  goal directed  Thought content:    Rumination  Sensory/Perceptual disturbances:    WNL  Orientation:  oriented to person, place, time/date, situation, day of week, month of year, year, and stated date of Oct. 3, 2024  Attention:  Good  Concentration:  Good and Fair  Memory:  Some short term memory  Fund of knowledge:   Good and Fair  Insight:    Good  Judgment:   Good  Impulse Control:  Good   Risk Assessment: Danger to Self:  No Self-injurious Behavior: No Danger to Others: No Duty to Warn:no Physical Aggression / Violence:No  Access to Firearms a concern: No  Gang Involvement:No   Subjective:  Patient in session today reporting her main concerns as her anxiety, family, and all the bad things going on in the world. Long time best friend's husband died this week and was able to share and process her sadness and shared "we had felt like family."  Worked with her also particularly on trying to scale back in her watching of TV programs and news that focuses and induces a lot on negative, fearful programming which feeds patient's fears and anxieties.  Difficult for patient to see this connection but encouraged her to try suggestions made today.  Noted that recently she had also lost a neighbor who died unexpectedly on her hallway at her retirement center.  Processed that particular grief more intentionally today and did seem to feel more grounded by the end of session.  Expresses concern about certain family members whom she feels suffers from anxiety also but do not  receive help.  Denies any depression.  Encouraged patient to continue being involved as much as possible in activities at her retirement home as that is good for her social and emotional health.  Interventions: Cognitive Behavioral Therapy and Ego-Supportive  Long term goal: Reduce overall level, frequency, and intensity of the anxiety so that daily functioning is not impaired.  Short term goal: Increase understanding of beliefs and messages that lead to worry and anxiety.  Patient will share her understanding in written form or verbally in sessions, or both. Strategy: Identify, challenge, and replace anxious or negative self-talk with positive, realistic, and empowering self-talk.    Diagnosis:   ICD-10-CM   1. Generalized anxiety disorder  F41.1      Plan: Patient today working well in session focusing on her anxiety, concerns and fears about world situations, and particular concerns about certain family members.  She does still struggle with stress and anxiety however has made some gains in coping skills.  She does need to continue working with goal-directed behaviors in order to move in a more hopeful and healthy direction.  Encouraged her to be practicing more self affirming behaviors as noted in session including: Having more calming moments for herself and be able to use some deep breathing exercises to feel less stressed and more grounded, using prayers and meditation for support which she has reported previously is helpful to her, walking in the hallways of her  retirement home for exercise, spending time with at least one of her friends daily who does not add to her anxiety, believing more in herself and her ability to get through difficult situations, staying in contact with people who care about her, recognize the progress she has already made, limit how much she watches world news as that is easily a stressor and feeds her anxiety, take occasional breaks from her electronics, focus on  what she can change or control, refrain from assuming worst-case scenarios, and realize the strength she shows working with goal-directed behaviors to move in a direction that supports her improved emotional health and overall wellbeing.  Goal review and progress/challenges noted with patient.  Next appointment within approximately 1 month.   Mathis Fare, LCSW

## 2023-06-26 ENCOUNTER — Telehealth: Payer: Self-pay | Admitting: Cardiovascular Disease

## 2023-06-26 MED ORDER — LOSARTAN POTASSIUM 25 MG PO TABS: 25.0000 mg | ORAL_TABLET | Freq: Every day | ORAL | 0 refills | Status: DC

## 2023-06-26 NOTE — Telephone Encounter (Signed)
Pt's medication was sent to pt's pharmacy as requested. Confirmation received.  °

## 2023-06-26 NOTE — Telephone Encounter (Signed)
*  STAT* If patient is at the pharmacy, call can be transferred to refill team.   1. Which medications need to be refilled? (please list name of each medication and dose if known) losartan (COZAAR) 25 MG tablet     4. Which pharmacy/location (including street and city if local pharmacy) is medication to be sent to? [id number] - [place], [place] - [address]     5. Do they need a 30 day or 90 day supply? 90 Pt states she is out of medication.

## 2023-07-13 DIAGNOSIS — M0589 Other rheumatoid arthritis with rheumatoid factor of multiple sites: Secondary | ICD-10-CM | POA: Diagnosis not present

## 2023-07-24 ENCOUNTER — Ambulatory Visit: Payer: PPO | Admitting: Psychiatry

## 2023-07-24 DIAGNOSIS — F411 Generalized anxiety disorder: Secondary | ICD-10-CM | POA: Diagnosis not present

## 2023-07-24 NOTE — Progress Notes (Signed)
[facility] Counselor/Therapist Progress Note  Patient ID: Andrea Ibarra, MRN: [id number],    Date: 07/24/2023  Time Spent: 55 minutes   Treatment Type: Individual Therapy  Reported Symptoms: Anxiety       Mental Status Exam:  Appearance:   Neat     Behavior:  Appropriate, Sharing, and Motivated  Motor:  Normal and uses cane in walking  Speech/Language:   Clear and Coherent  Affect:  anxious  Mood:  anxious  Thought process:  goal directed  Thought content:    Rumination  Sensory/Perceptual disturbances:    WNL  Orientation:  oriented to person, place, time/date, situation, day of week, month of year, year, and stated date of Nov. 5, 2024.  Attention:  Good  Concentration:  Good and Fair  Memory:  WNL   Fund of knowledge:   Good  Insight:    Good and Fair  Judgment:   Good  Impulse Control:  Good   Risk Assessment: Danger to Self:  No Self-injurious Behavior: No Danger to Others: No Duty to Warn:no Physical Aggression / Violence:No  Access to Firearms a concern: No  Gang Involvement:No   Subjective:   Patient a in session and reports her primary symptoms are anxiety, family concerns, and "the violence and other bad things happening in the world".  Missing deceased husband, and especially with holidays upcoming.  Today states she needs to work on several issues including family challenges, health concerns, fears and concerns "in the world". Talks ongoing today about the stressors she faces at this point in her life in the areas just noted, and did well in talking through specific concerns in each of the areas and receiving therapist support. Has been stressed some recently re: the Presidential election and talked through some of the stress she's felt regarding that event. "Family pushing me to vote" and appreciated venting some of her concerns in office today without feeling pushed. Encouraged her in positive self-care and in keeping regular contact with others at her  retirement community and at her church and other places in which she is connected.   Interventions: Cognitive Behavioral Therapy and Ego-Supportive  Long term goal: Reduce overall level, frequency, and intensity of the anxiety so that daily functioning is not impaired.  Short term goal: Increase understanding of beliefs and messages that lead to worry and anxiety.  Patient will share her understanding in written form or verbally in sessions, or both. Strategy: Identify, challenge, and replace anxious or negative self-talk with positive, realistic, and empowering self-talk.   Diagnosis:   ICD-10-CM   1. Generalized anxiety disorder  F41.1      Plan:  Patient today in session and focusing further on her concerns and fears about her anxiety, concerns and fears about world situations, and also some particular concerns in reference to family members.  (Not all details included in this note due to patient privacy needs).  Patient working further today on better managing stress and how this factors into her anxiety level.  She does feel she has made some gains in her coping skills about certain stressors.  While recognizing this progress she also agrees she needs to continue working with goal-directed behaviors to keep moving in a more hopeful and healthier direction.  Encouraged patient to be practicing more self affirming behaviors as noted in session including: Having more calming moments for herself and be able to use some deep breathing exercises to feel less stressed and more grounded, using prayers  and meditation for support which she has used previously and was helpful to her, walking in the hallways of her retirement home for exercise, spending time with at least one of her friends daily who does not add to her anxiety, believing more in herself and her ability to get through difficult situations, staying in contact with people who care about her, recognize the progress she has already made, limit  how much she watches world news as that is easily a stressor and feeds her anxiety, take occasional breaks from her electronics, focus on what she can change her control, refrain from assuming worst-case scenarios, and recognize the strength she shows working with goal-directed behaviors to move in a direction that supports her improved emotional health and her overall outlook into the future.  Goal review and progress/challenges noted with patient.  Next appointment within approximately 1 month.   Andrea Fare, LCSW

## 2023-07-30 DIAGNOSIS — L603 Nail dystrophy: Secondary | ICD-10-CM | POA: Diagnosis not present

## 2023-07-30 DIAGNOSIS — M19071 Primary osteoarthritis, right ankle and foot: Secondary | ICD-10-CM | POA: Diagnosis not present

## 2023-07-30 DIAGNOSIS — M205X1 Other deformities of toe(s) (acquired), right foot: Secondary | ICD-10-CM | POA: Diagnosis not present

## 2023-08-10 DIAGNOSIS — R051 Acute cough: Secondary | ICD-10-CM | POA: Diagnosis not present

## 2023-08-10 DIAGNOSIS — J069 Acute upper respiratory infection, unspecified: Secondary | ICD-10-CM | POA: Diagnosis not present

## 2023-09-03 ENCOUNTER — Ambulatory Visit: Payer: PPO | Admitting: Psychiatry

## 2023-09-03 DIAGNOSIS — F411 Generalized anxiety disorder: Secondary | ICD-10-CM

## 2023-09-03 NOTE — Progress Notes (Signed)
[facility] Counselor/Therapist Progress Note  Patient ID: Otto LUCINDA SOLIDAY, MRN: [id number],    Date: 09/03/2023  Time Spent: 50 minutes   Treatment Type: Individual Therapy  Reported Symptoms:  anxious, family "worries"   Mental Status Exam:  Appearance:   Casual     Behavior:  Appropriate, Sharing, and Motivated  Motor:  Uses cane for walking  Speech/Language:   Clear and Coherent  Affect:  anxious  Mood:  anxious  Thought process:  goal directed  Thought content:    Rumination  Sensory/Perceptual disturbances:    WNL  Orientation:  oriented to person, place, time/date, situation, day of week, month of year, year, and stated date of Dec. 16, 2024  Attention:  Good  Concentration:  Good  Memory:  WNL  Fund of knowledge:   Good  Insight:    Good  Judgment:   Good  Impulse Control:  Good   Risk Assessment: Danger to Self:  No Self-injurious Behavior: No Danger to Others: No Duty to Warn:no Physical Aggression / Violence:No  Access to Firearms a concern: No  Gang Involvement:No   Subjective: Patient in session today reporting symptoms of anxiety, family "worries", and struggles with certain aspects of family over which she has no control. Encouraged to limit  her exposure to violence and other disturbing news broadcasts as that feeds her worry and anxiety. Concerned about her daughter's personal issues. (Not all details included in this note.) Continued work on several family concerns and fears, but noticing her strength and also her challenges within family and health which she shared at length today. Tries to avoid very stressful situations as that aggravates her anxiety. To continue her positive self-care and maintain contact with her family, and also her friends who live in same retirement facility where patient lives.   Interventions: Cognitive Behavioral Therapy and Ego-Supportive  Long term goal: Reduce overall level, frequency, and intensity of the anxiety so  that daily functioning is not impaired.  Short term goal: Increase understanding of beliefs and messages that lead to worry and anxiety.  Patient will share her understanding in written form or verbally in sessions, or both. Strategy: Identify, challenge, and replace anxious or negative self-talk with positive, realistic, and empowering self-talk.  Diagnosis:   ICD-10-CM   1. Generalized anxiety disorder  F41.1      Plan:  Patient in session today working further on her anxiety, struggle with certain things regarding the family over which she has no control, and other family "worries".  She does try to follow through on recommended strategies and has shown some progress.  Needs to continue her work with goal-directed behaviors in order to keep moving in a healthier and more hopeful direction for her. Reminded and encouraged patient to be practicing more self affirming behaviors as noted in session including: Having more calming moments for herself and be able to use some deep breathing exercises to feel less stressed and more grounded, using prayers and meditation for support which she has used previously with benefit, walking some in the hallways of her retirement home for exercise, spending time with at least one of her friends daily who does not add to her anxiety, believing more in herself and her ability to get through difficult situations, staying in contact with people who care about her, recognize the progress she has already made, limit how much she watches world news as that is easily a stressor and can feed her anxiety, take occasional breaks from  her electronics, focus on the things that she can change or control, refrain from assuming worst-case scenarios, and recognize the strength she shows when working with goal-directed behaviors to move in a direction that supports her improved emotional health and her overall wellbeing.  Goal review and progress/challenges noted with patient  Next  appt within 4-6 weeks.    Mathis Fare, LCSW

## 2023-09-25 DIAGNOSIS — N644 Mastodynia: Secondary | ICD-10-CM | POA: Diagnosis not present

## 2023-09-26 ENCOUNTER — Other Ambulatory Visit: Payer: Self-pay | Admitting: Obstetrics and Gynecology

## 2023-09-26 DIAGNOSIS — N644 Mastodynia: Secondary | ICD-10-CM

## 2023-09-27 DIAGNOSIS — M8580 Other specified disorders of bone density and structure, unspecified site: Secondary | ICD-10-CM | POA: Diagnosis not present

## 2023-09-27 DIAGNOSIS — N952 Postmenopausal atrophic vaginitis: Secondary | ICD-10-CM | POA: Diagnosis not present

## 2023-09-27 DIAGNOSIS — Z6829 Body mass index (BMI) 29.0-29.9, adult: Secondary | ICD-10-CM | POA: Diagnosis not present

## 2023-09-27 DIAGNOSIS — N644 Mastodynia: Secondary | ICD-10-CM | POA: Diagnosis not present

## 2023-09-27 DIAGNOSIS — Z01419 Encounter for gynecological examination (general) (routine) without abnormal findings: Secondary | ICD-10-CM | POA: Diagnosis not present

## 2023-10-01 ENCOUNTER — Ambulatory Visit: Payer: PPO | Admitting: Cardiovascular Disease

## 2023-10-03 DIAGNOSIS — R92323 Mammographic fibroglandular density, bilateral breasts: Secondary | ICD-10-CM | POA: Diagnosis not present

## 2023-10-03 DIAGNOSIS — N644 Mastodynia: Secondary | ICD-10-CM | POA: Diagnosis not present

## 2023-10-05 ENCOUNTER — Other Ambulatory Visit: Payer: Self-pay | Admitting: Cardiovascular Disease

## 2023-10-08 ENCOUNTER — Ambulatory Visit: Payer: PPO | Admitting: Psychiatry

## 2023-10-08 DIAGNOSIS — F411 Generalized anxiety disorder: Secondary | ICD-10-CM | POA: Diagnosis not present

## 2023-10-08 NOTE — Progress Notes (Signed)
[facility] Counselor/Therapist Progress Note  Patient ID: Andrea Ibarra, MRN: [id number],    Date: 10/08/2023  Time Spent: 48 minutes   Treatment Type: Individual Therapy  Reported Symptoms: anxiety, "worries about family"    Mental Status Exam:  Appearance:   Casual     Behavior:  Appropriate, Sharing, and Motivated  Motor:  Uses cane in walking  Speech/Language:   Clear and Coherent  Affect:  anxious  Mood:  anxious  Thought process:  goal directed  Thought content:    Rumination  Sensory/Perceptual disturbances:    WNL  Orientation:  oriented to person, place, time/date, situation, day of week, month of year, year, and stated date of Jan. 20, 2025  Attention:  Good  Concentration:  Good and Fair  Memory:  Some age-related memory issue "but better than a lot of people my age"  Fund of knowledge:   Good and Fair  Insight:    Good and Fair  Judgment:   Good  Impulse Control:  Good   Risk Assessment: Danger to Self:  No Self-injurious Behavior: No Danger to Others: No Duty to Warn:no Physical Aggression / Violence:No  Access to Firearms a concern: No  Gang Involvement:No   Subjective:  Patient in session today with symptoms of anxiety, stress, and worrying mostly regarding family. Concerned about family member's mental health and increased anxiety, which patient is realizing impacts her own anxiety.  Shared examples that  impact patient and her own anxiety, including issues with a family member that impacts patient in negative ways.  Worked with patient on how she can best manage the impact of the situations and still preserve as healthy of a relationship as possible with the other family member.  Also looked at some different ways of communicating with this individual, especially the concerns that patient has regarding their relationship and how patient is often impacted in unhelpful and significant ways.  Patient also shared and discussed some of her anxieties and  "worries" about aging and looking at best strategies that can help her mentally and emotionally, including having healthy boundaries with people that she is around who often tend to overly focused on the negative impacts of aging and patient is working to see aging as a "normal progression" for herself and others.  Special emphasis on more quickly identifying and replacing anxious and/or negative self talk and replacing it with more encouraging, positive, realistic self-walk.   Interventions: Cognitive Behavioral Therapy and Ego-Supportive  Long term goal: Reduce overall level, frequency, and intensity of the anxiety so that daily functioning is not impaired.  Short term goal: Increase understanding of beliefs and messages that lead to worry and anxiety.  Patient will share her understanding in written form or verbally in sessions, or both. Strategy: Identify, challenge, and replace anxious or negative self-talk with positive, realistic, and empowering self-talk.  Diagnosis:   ICD-10-CM   1. Generalized anxiety disorder  F41.1      Plan: Patient today in session and continuing her work on anxiety, and her concerns about certain family relationships over which she has no control especially one of her adult children.  Recognizing more what she can control and what she cannot, although finds it frustrating.  Patient reports trying to show better anxiety management around this family member in hopes that family member could pick up on patient's more positive way of managing anxiety and the things that are out of our control.  Patient is showing progress and needs  to continue her work on goal-directed behaviors in order to keep moving in a more positive and healthier direction.  Encouraged patient to be practicing more self affirming behaviors as noted in sessions including: Work on deep breathing exercises and self calming activities to help better manage stress and feel more grounded, using prayers and  meditation for support which she has previously stated benefited her, walking some in the hallways of her retirement home for exercise, spending time with at least one of her friends daily who does not add to her anxiety, believe more in herself and her ability to get through difficult situations, stay in contact with people who care about her, recognize the progress she has already made, limit how much she watches world news as that is easily a stressor and can feed her anxiety, take occasional breaks from her electronics, focus on the things that she can change or control versus cannot, refrain from assuming worst-case scenarios, and recognize the strength she shows when working with goal-directed behaviors to move in a direction that supports her improved emotional health and her overall outlook into the future.  Goal review and progress/challenges noted with patient.  Next appointment within 4-6 weeks.   Mathis Fare, LCSW

## 2023-10-12 DIAGNOSIS — E7800 Pure hypercholesterolemia, unspecified: Secondary | ICD-10-CM | POA: Diagnosis not present

## 2023-10-13 LAB — LAB REPORT - SCANNED: EGFR: 61

## 2023-10-15 ENCOUNTER — Encounter: Payer: Self-pay | Admitting: Cardiovascular Disease

## 2023-10-15 ENCOUNTER — Ambulatory Visit: Payer: PPO | Attending: Cardiovascular Disease | Admitting: Cardiovascular Disease

## 2023-10-15 VITALS — BP 130/80 | HR 86 | Ht 59.5 in | Wt 140.2 lb

## 2023-10-15 DIAGNOSIS — R0609 Other forms of dyspnea: Secondary | ICD-10-CM | POA: Diagnosis not present

## 2023-10-15 DIAGNOSIS — I2510 Atherosclerotic heart disease of native coronary artery without angina pectoris: Secondary | ICD-10-CM

## 2023-10-15 DIAGNOSIS — E782 Mixed hyperlipidemia: Secondary | ICD-10-CM

## 2023-10-15 DIAGNOSIS — I5022 Chronic systolic (congestive) heart failure: Secondary | ICD-10-CM | POA: Diagnosis not present

## 2023-10-15 NOTE — Patient Instructions (Signed)
Medication Instructions:  Your physician recommends that you continue on your current medications as directed. Please refer to the Current Medication list given to you today.  *If you need a refill on your cardiac medications before your next appointment, please call your pharmacy*  Lab Work: None ordered today  Testing/Procedures: None ordered today  Follow-Up: At [facility], you and your health needs are our priority.  As part of our continuing mission to provide you with exceptional heart care, we have created designated Provider Care Teams.  These Care Teams include your primary Cardiologist (physician) and Advanced Practice Providers (APPs -  Physician Assistants and Nurse Practitioners) who all work together to provide you with the care you need, when you need it.  Your next appointment:   2 year(s)  The format for your next appointment:   In Person  Provider:   Tonny Bollman, MD

## 2023-10-15 NOTE — Progress Notes (Signed)
Cardiology Office Note:    Date:  10/15/2023   ID:  Andrea Ibarra, B[place]ett [birth year], MRN [id number]  PCP:  Merri Brunette, MD   [facility] Providers Cardiologist:  Tonny Bollman, MD     Referring MD: Merri Brunette, MD   Chief Complaint  Patient presents with   Coronary Artery Disease    History of Present Illness:    Andrea Ibarra is a 83 y.o. female with a hx of coronary artery disease, presenting for follow-up evaluation.  The patient has a longstanding history of CAD after presenting with acute MI in 1996, treated with balloon angioplasty.  She is limited by rheumatoid arthritis.  Comorbid conditions include hypertension, PVCs, and hyperlipidemia.   The patient is here alone today.  She is doing really well.  She continues to enjoy living at friend's home.  She is not currently experiencing problems with chest pain, chest pressure, or shortness of breath.  She reports no changes in her medications.  She has no orthopnea, PND, or leg swelling.   Current Medications: Current Meds  Medication Sig   acetaminophen (TYLENOL) 500 MG tablet Take 1,000 mg by mouth every 8 (eight) hours as needed (for pain.).   albuterol (PROVENTIL HFA;VENTOLIN HFA) 108 (90 Base) MCG/ACT inhaler Inhale 2 puffs into the lungs every 6 (six) hours as needed for wheezing or shortness of breath.   aspirin EC 81 MG tablet Take 81 mg by mouth daily.   azelastine (ASTELIN) 0.1 % nasal spray Place 1 spray into both nostrils 2 (two) times daily as needed (for pain.). Use in each nostril as directed   Calcium Citrate-Vitamin D (CALCIUM CITRATE + D PO) Take 2 tablets by mouth daily.    Cholecalciferol (VITAMIN D) 2000 units CAPS Take 2,000 Units by mouth daily.   folic acid (FOLVITE) 1 MG tablet Take 2 mg by mouth daily.   ibuprofen (ADVIL) 200 MG tablet Take 400 mg by mouth daily.   losartan (COZAAR) 25 MG tablet TAKE 1 TABLET (25 MG TOTAL) BY MOUTH DAILY.   methotrexate (RHEUMATREX) 2.5 MG tablet Take 15  mg by mouth once a week. Caution:Chemotherapy. Protect from light.   [DISCONTINUED] rosuvastatin (CRESTOR) 10 MG tablet Take 10 mg by mouth 3 (three) times a week. M-W-F     Allergies:   Ciprofloxacin hcl, Codeine, Erythromycin, Levofloxacin, Metoprolol tartrate, Morphine, Penicillins, Percocet [oxycodone-acetaminophen], Vancomycin, Azithromycin, Ceftin [cefuroxime axetil], Oxycodone-acetaminophen, Sulfa antibiotics, Doxycycline, Other, Oxycodone, and Sulfonamide derivatives   ROS:   Please see the history of present illness.    Positive for multijoint arthritis. All other systems reviewed and are negative.  EKGs/Labs/Other Studies Reviewed:    The following studies were reviewed today: Cardiac Studies & Procedures      ECHOCARDIOGRAM  ECHOCARDIOGRAM COMPLETE 10/31/2022  Narrative ECHOCARDIOGRAM REPORT    Patient Name:   Andrea Ibarra Date of Exam: 10/31/2022 Medical Rec #:  [id number]     Height:       59.0 in Accession #:    [id number]    Weight:       146.0 lb Date of Birth:  [birth year]     BSA:          1.613 m Patient Age:    81 years      BP:           120/68 mmHg Patient Gender: F             HR:  82 bpm. 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 Previous Myocardial Infarction and CAD; Risk Factors:Family History of Coronary Artery Disease, Hypertension, Dyslipidemia and Former Smoker. Prior EF 45-50%.  Sonographer:     Farrel Conners RDCS Referring Phys:  Tonny Bollman Diagnosing Phys: Chilton Si MD  IMPRESSIONS   1. Left ventricular ejection fraction, by estimation, is 45 to 50%. The left ventricle has mildly decreased function. The left ventricle has no regional wall motion abnormalities. Left ventricular diastolic parameters are consistent with  Grade I diastolic dysfunction (impaired relaxation). 2. Right ventricular systolic function is normal. The right ventricular size is normal. There is normal pulmonary artery systolic pressure. 3. The mitral valve is normal in structure. Trivial mitral valve regurgitation. No evidence of mitral stenosis. 4. The aortic valve is tricuspid. Aortic valve regurgitation is trivial. No aortic stenosis is present. 5. The inferior vena cava is normal in size with greater than 50% respiratory variability, suggesting right atrial pressure of 3 mmHg.  FINDINGS Left Ventricle: Left ventricular ejection fraction, by estimation, is 45 to 50%. The left ventricle has mildly decreased function. The left ventricle has no regional wall motion abnormalities. The left ventricular internal cavity size was normal in size. There is no left ventricular hypertrophy. Left ventricular diastolic parameters are consistent with Grade I diastolic dysfunction (impaired relaxation). Normal left ventricular filling pressure.   LV Wall Scoring: The anterior wall and entire lateral wall are hypokinetic. The entire septum, entire inferior wall, apical anterior segment, and apex are normal.  Right Ventricle: The right ventricular size is normal. No increase in right ventricular wall thickness. Right ventricular systolic function is normal. There is normal pulmonary artery systolic pressure. The tricuspid regurgitant velocity is 1.75 m/s, and with an assumed right atrial pressure of 3 mmHg, the estimated right ventricular systolic pressure is 15.2 mmHg.  Left Atrium: Left atrial size was normal in size.  Right Atrium: Right atrial size was normal in size.  Pericardium: There is no evidence of pericardial effusion.  Mitral Valve: The mitral valve is normal in structure. Trivial mitral valve regurgitation. No evidence of mitral valve stenosis.  Tricuspid Valve: The tricuspid valve is normal in structure. Tricuspid valve  regurgitation is trivial. No evidence of tricuspid stenosis.  Aortic Valve: The aortic valve is tricuspid. Aortic valve regurgitation is trivial. No aortic stenosis is present.  Pulmonic Valve: The pulmonic valve was normal in structure. Pulmonic valve regurgitation is trivial. No evidence of pulmonic stenosis.  Aorta: The aortic root is normal in size and structure.  Venous: The inferior vena cava is normal in size with greater than 50% respiratory variability, suggesting right atrial pressure of 3 mmHg.  IAS/Shunts: No atrial level shunt detected by color flow Doppler.   LEFT VENTRICLE PLAX 2D LVIDd:         4.30 cm   Diastology LVIDs:         2.50 cm   LV e' medial:    7.62 cm/s LV PW:         0.80 cm   LV E/e' medial:  6.7 LV IVS:        0.80 cm   LV e' lateral:   8.77 cm/s LVOT diam:     2.50 cm   LV E/e' lateral: 5.8 LV  SV:         78 LV SV Index:   48 LVOT Area:     4.91 cm  3D Volume EF: 3D EF:        65 % LV EDV:       109 ml LV ESV:       38 ml LV SV:        71 ml  RIGHT VENTRICLE RV Basal diam:  3.10 cm RV S prime:     14.00 cm/s TAPSE (M-mode): 2.0 cm  LEFT ATRIUM             Index        RIGHT ATRIUM          Index LA diam:        3.30 cm 2.05 cm/m   RA Area:     9.45 cm LA Vol (A2C):   45.0 ml 27.89 ml/m  RA Volume:   19.60 ml 12.15 ml/m LA Vol (A4C):   43.4 ml 26.90 ml/m LA Biplane Vol: 44.7 ml 27.70 ml/m AORTIC VALVE LVOT Vmax:   83.50 cm/s LVOT Vmean:  54.500 cm/s LVOT VTI:    0.159 m  AORTA Ao Root diam: 2.70 cm Ao Asc diam:  2.90 cm  MITRAL VALVE               TRICUSPID VALVE MV Area (PHT): cm         TR Peak grad:   12.2 mmHg MV Decel Time: 263 msec    TR Vmax:        175.00 cm/s MV E velocity: 51.10 cm/s MV A velocity: 99.75 cm/s  SHUNTS MV E/A ratio:  0.51        Systemic VTI:  0.16 m Systemic Diam: 2.50 cm  Chilton Si MD Electronically signed by Chilton Si MD Signature Date/Time: 10/31/2022/6:46:52 PM    Final  (Updated)             EKG:   EKG Interpretation Date/Time:  Monday October 15 2023 09:28:16 EST Ventricular Rate:  86 PR Interval:  154 QRS Duration:  72 QT Interval:  334 QTC Calculation: 399 R Axis:   -8  Text Interpretation: Normal sinus rhythm with sinus arrhythmia Possible Inferior infarct , age undetermined When compared with ECG of 31-Jul-2013 12:10, Borderline criteria for Inferior infarct are now Present Confirmed by Tonny Bollman [id number]) on 10/15/2023 9:48:25 AM    Recent Labs: No results found for requested labs within last 365 days.  Recent Lipid Panel No results found for: "CHOL", "TRIG", "HDL", "CHOLHDL", "VLDL", "LDLCALC", "LDLDIRECT"   Risk Assessment/Calculations:                Physical Exam:    VS:  BP 130/80   Pulse 86   Ht 4' 11.5" (1.511 m)   Wt 140 lb 3.2 oz (63.6 kg)   SpO2 95%   BMI 27.84 kg/m     Wt Readings from Last 3 Encounters:  10/15/23 140 lb 3.2 oz (63.6 kg)  10/05/22 146 lb (66.2 kg)  10/21/21 147 lb (66.7 kg)     GEN:  Well nourished, well developed pleasant elderly woman in no acute distress HEENT: Normal NECK: No JVD; No carotid bruits LYMPHATICS: No lymphadenopathy CARDIAC: RRR, no murmurs, rubs, gallops RESPIRATORY:  Clear to auscultation without rales, wheezing or rhonchi  ABDOMEN: Soft, non-tender, non-distended MUSCULOSKELETAL:  No edema; No deformity  SKIN: Warm and dry NEUROLOGIC:  Alert and oriented x 3 PSYCHIATRIC:  Normal affect   Assessment & Plan Chronic heart failure with mildly reduced ejection fraction (HFmrEF, 41-49%) (HCC) Patient remains clinically stable.  She is treated with losartan.  No changes will be made in her medication regimen.  I will see her back in 2 years for follow-up.  She follows regularly with her primary care physician. Mixed hyperlipidemia Treated with atorvastatin 20 mg daily, with dose limited by side effect profile.  Last lipids with a cholesterol 185, HDL 91, LDL 81.  Continue  current therapy. Coronary artery disease involving native coronary artery of native heart without angina pectoris Patient is doing well with no angina.  Her EKG shows no ischemic changes.  She is approaching 30 years out from her acute coronary syndrome with no recurrent ischemic events.  Continue current management with aspirin for antiplatelet therapy, statin drug, and an ARB. Dyspnea on exertion Mild, stable.  Echo reviewed from last year which showed no interval changes from her previous echo studies.  LVEF 45%.  No significant valvular disease.       Medication Adjustments/Labs and Tests Ordered: Current medicines are reviewed at length with the patient today.  Concerns regarding medicines are outlined above.  Orders Placed This Encounter  Procedures   EKG 12-Lead   No orders of the defined types were placed in this encounter.   There are no Patient Instructions on file for this visit.   Signed, Tonny Bollman, MD  10/15/2023 9:51 AM    [facility]

## 2023-10-16 DIAGNOSIS — I10 Essential (primary) hypertension: Secondary | ICD-10-CM | POA: Diagnosis not present

## 2023-10-16 DIAGNOSIS — E559 Vitamin D deficiency, unspecified: Secondary | ICD-10-CM | POA: Diagnosis not present

## 2023-10-16 DIAGNOSIS — E7800 Pure hypercholesterolemia, unspecified: Secondary | ICD-10-CM | POA: Diagnosis not present

## 2023-10-16 DIAGNOSIS — I2510 Atherosclerotic heart disease of native coronary artery without angina pectoris: Secondary | ICD-10-CM | POA: Diagnosis not present

## 2023-10-18 DIAGNOSIS — M25551 Pain in right hip: Secondary | ICD-10-CM | POA: Diagnosis not present

## 2023-10-18 DIAGNOSIS — R600 Localized edema: Secondary | ICD-10-CM | POA: Diagnosis not present

## 2023-10-18 DIAGNOSIS — Z6828 Body mass index (BMI) 28.0-28.9, adult: Secondary | ICD-10-CM | POA: Diagnosis not present

## 2023-10-18 DIAGNOSIS — G4709 Other insomnia: Secondary | ICD-10-CM | POA: Diagnosis not present

## 2023-10-18 DIAGNOSIS — M1991 Primary osteoarthritis, unspecified site: Secondary | ICD-10-CM | POA: Diagnosis not present

## 2023-10-18 DIAGNOSIS — E663 Overweight: Secondary | ICD-10-CM | POA: Diagnosis not present

## 2023-10-18 DIAGNOSIS — M722 Plantar fascial fibromatosis: Secondary | ICD-10-CM | POA: Diagnosis not present

## 2023-10-18 DIAGNOSIS — M0589 Other rheumatoid arthritis with rheumatoid factor of multiple sites: Secondary | ICD-10-CM | POA: Diagnosis not present

## 2023-10-18 DIAGNOSIS — M5136 Other intervertebral disc degeneration, lumbar region with discogenic back pain only: Secondary | ICD-10-CM | POA: Diagnosis not present

## 2023-10-26 DIAGNOSIS — N644 Mastodynia: Secondary | ICD-10-CM | POA: Diagnosis not present

## 2023-11-12 ENCOUNTER — Ambulatory Visit: Payer: PPO | Admitting: Psychiatry

## 2023-11-12 DIAGNOSIS — F411 Generalized anxiety disorder: Secondary | ICD-10-CM

## 2023-11-12 NOTE — Progress Notes (Signed)
 [facility] Counselor/Therapist Progress Note  Patient ID: Andrea Ibarra, MRN: [id number],    Date: 11/12/2023  Time Spent: 50 minutes   Treatment Type: Individual Therapy  Reported Symptoms: anxiety, "worries" about family   Mental Status Exam:  Appearance:   Casual and Neat     Behavior:  Appropriate, Sharing, and Motivated  Motor:  Affected some in walking with her cane  Speech/Language:   Clear and Coherent  Affect:  anxious  Mood:  anxious  Thought process:  goal directed  Thought content:    Rumination  Sensory/Perceptual disturbances:    WNL  Orientation:  oriented to person, place, time/date, situation, day of week, month of year, year, and stated date of Feb. 24, 2025  Attention:  Good  Concentration:  Good and Fair  Memory:  WNL  Fund of knowledge:   Good  Insight:    Good and Fair  Judgment:   Good  Impulse Control:  Good   Risk Assessment: Danger to Self:  No Self-injurious Behavior: No Danger to Others: No Duty to Warn:no Physical Aggression / Violence:No  Access to Firearms a concern: No  Gang Involvement:No   Subjective:   Patient in session today and reporting symptoms of worrying about things out of her control, anxiety, stress, and difficulty letting go of what she cannot control. Stressors also with adult daughter frequently calling patient with anxiety-provoking fears that tend to heighten patient's concerns. Stressed also with adult son's recent BP/heart-related concerns. Challenges with her adult daughter and other family concerns discussed today that are negatively impacting her mood. (Not all details included in this note due to patient privacy needs.) Worked with patient on stress reduction exercises and particularly her doing some walking inside her retirement home on a daily basis.  Also discussed setting limits with people that tend to create more stress for her including within the family.  Patient did well and processing this today and  knows that it is difficult to sometimes set limits especially within the family but also recognizing the toll that it takes on her when she does not do this.  Worked with several examples during the session today which seemed to be helpful to patient.  Has tried to get family member to get help however the person refuses and patient is having to accept that she cannot control this.  Patient also recognizing how the other person stress is impacting her on stress and anxiety level.   Patient discussed more today some of what we talked about last session in reference to trying to talk to people and away that hopefully they can feel helped and cared about and work to make some changes in ways that we will still preserve their relationship as family members.  This was especially important to patient and she seemed to feel good about this.  Continues to have some anxieties and worries about aging, (trying to see it as a normal progression for herself and others) and looking at strategies that can help her emotionally and mentally, part of which involves healthy boundaries with other people as needed.  Discussed a few examples especially about identifying and replacing anxious thoughts as early as possible in order to prevent them from negatively impacting her.  Continues to work on replacing the anxious/negative thoughts and self talk and replace them with more encouraging, hopeful, and realistic self talk.   Interventions: Cognitive Behavioral Therapy and Ego-Supportive  Long term goal: Reduce overall level, frequency, and intensity of  the anxiety so that daily functioning is not impaired.  Short term goal: Increase understanding of beliefs and messages that lead to worry and anxiety.  Patient will share her understanding in written form or verbally in sessions, or both. Strategy: Identify, challenge, and replace anxious or negative self-talk with positive, realistic, and empowering self-talk.  Diagnosis:    ICD-10-CM   1. Generalized anxiety disorder  F41.1      Plan:  Patient in for her session today and working further on her concerns about family relationships, her anxiety, worrying about things out of her control, and needing better stress management and limit setting.  Patient showed good effort in session today.  Looking better at what she can control versus cannot control, along with how she cannot control other family members. We also discussed some of the progress she has made and needs to continue her work with goal-directed behaviors in order to keep moving in a more healthy and positive direction. Encouraged patient to be practicing more self affirming behaviors as noted in sessions including: Using prayers and meditation for support which patient has previously stated benefits her, work on deep breathing exercises and self calming activities to better manage stress and feel more grounded, walking some in the hallways of her retirement home for exercise and stress relief, spending time with at least one of her friends daily who does not add to her anxiety, believe more in herself and her ability to get through difficult situations, stay in contact with people who care about her, recognize the progress she has already made, limit how much she watches disturbing world news as that is easily a stressor and can feed her anxiety, take occasional breaks from her electronics, focus on the things she can change or control versus cannot, refrain from assuming worst-case scenarios, and realize the strength she can show when working with goal-directed behaviors to move in a direction that supports her improved emotional health and her overall wellbeing.  Goal review and progress/challenges noted with patient.  Next appointment within 4 to 6 weeks.   Mathis Fare, LCSW

## 2023-11-25 ENCOUNTER — Other Ambulatory Visit: Payer: Self-pay | Admitting: Cardiovascular Disease

## 2023-12-10 ENCOUNTER — Ambulatory Visit: Payer: PPO | Admitting: Psychiatry

## 2023-12-10 DIAGNOSIS — F411 Generalized anxiety disorder: Secondary | ICD-10-CM | POA: Diagnosis not present

## 2023-12-10 NOTE — Progress Notes (Signed)
 [facility] Counselor/Therapist Progress Note  Patient ID: Andrea Ibarra, MRN: [id number],    Date: 12/10/2023  Time Spent: 50 minutes   Treatment Type: Individual Therapy  Reported Symptoms:  anxiety, "worries" about family   Mental Status Exam:  Appearance:   Neat     Behavior:  Appropriate, Sharing, and Motivated  Motor:  Uses cane in walking  Speech/Language:   Clear and Coherent  Affect:  anxious  Mood:  anxious  Thought process:  goal directed  Thought content:    Rumination  Sensory/Perceptual disturbances:    WNL  Orientation:  oriented to person, place, time/date, situation, day of week, month of year, year, and stated date of December 10, 2023  Attention:  Fair  Concentration:  Good and Fair  Memory:  Has some forgetfulness "but not things like locking the door etc."  Fund of knowledge:   Good and Fair  Insight:    Good and Fair  Judgment:   Good  Impulse Control:  Good   Risk Assessment: Danger to Self:  No Self-injurious Behavior: No Danger to Others: No Duty to Warn:no Physical Aggression / Violence:No  Access to Firearms a concern: No  Gang Involvement:No   Subjective   Patient working today in session further on her worrying particularly about things out of her control, stress, difficulty letting go of what she cannot control, anxiety, and setting healthy limits for herself. Anxious and concerned about immediate family members with significant health issues, especially her son and daughter-in-law.  Does have more specific worries and anxieties about aging and processed these more today which she states helps her and better managing them.  Noticeable difficulty hearing and encouraged her to follow through on her earlier commitment to get hearing aids as her hearing seems worse today. Family stressors continue and patient able to share and talk through some stressors re: family health concerns, her own health, and trying to reduce her anxiety and trying to be  more aware of more effective anxiety management techniques discussed in session today. Continues some daily walking around her retirement center for exercise and does socialize with other residents rather than isolate in her room. Discussed and reinforced patient's use of breathing exercises particularly for helping with her anxiety and worrying, along with the practice of "letting go" of certain thoughts that can produce a lot of anxiety and are not necessarily rooted in fact or the truth.  Reviewed setting healthier limits with others where she lives, particularly those who tend to create anxiety for patient.  Knows that she needs to create some boundaries at times within the family but difficult to do because "it is family".  Does realize she cannot  control certain situations and other people.  Did discuss some role-remodeling for patient especially with family members however we were right at the end of session and will pick this up at her next session.  Encouraged her to continue working on identifying and interrupting anxious thoughts and replacing with thoughts that are more encouraging and realistic.  Interventions: Cognitive Behavioral Therapy and Ego-Supportive  Long term goal: Reduce overall level, frequency, and intensity of the anxiety so that daily functioning is not impaired.  Short term goal: Increase understanding of beliefs and messages that lead to worry and anxiety.  Patient will share her understanding in written form or verbally in sessions, or both. Strategy: Identify, challenge, and replace anxious or negative self-talk with positive, realistic, and empowering self-talk.   Diagnosis:  ICD-10-CM   1. Generalized anxiety disorder  F41.1      Plan:   Patient in today for her session working more on her issues surrounding family relationships and her anxiety/worrying particular about things out of her control, also working on her stress management and limit setting both of  which are very challenging for her.  Has said before that her mother had a long history of anxiety and other adults within the immediate family struggle with anxiety.  Does show good effort and in some ways has made progress.  Encouraged patient in her practice of more self affirming behaviors as noted in sessions including: Work on deep breathing exercises and self calming activities to better manage stress and feel more grounded, use of prayers and meditation for support which patient has previously stated benefits her, walking some in the hallways of her retirement home for exercise and stress relief, spending time with at least one of her friends daily who does not add to her anxiety, believe more in herself and her ability to get through difficult situations, stay in contact with people who care about her, recognize the progress she has already made, limit how much she watches disturbing world news as that is very much a stressor for her and feeds her anxiety, take occasional breaks from her electronics, focus on the things she can change her control, refrain from assuming worst-case scenarios, and recognize the strength she can show when working with goal-directed behaviors to move in a direction that supports her improved emotional health and her outlook into the future. This patient has made some progress and does work on her goals; she needs to continue working with goal-directed behaviors in order to keep moving in a healthier and more positive direction at this point in her life.  Goal review and progress/challenges noted with patient.  Next appointment within 4 to 6 weeks.   Mathis Fare, LCSW

## 2023-12-25 ENCOUNTER — Ambulatory Visit: Payer: PPO | Admitting: Cardiovascular Disease

## 2024-01-10 DIAGNOSIS — M0589 Other rheumatoid arthritis with rheumatoid factor of multiple sites: Secondary | ICD-10-CM | POA: Diagnosis not present

## 2024-02-04 ENCOUNTER — Ambulatory Visit: Admitting: Psychiatry

## 2024-02-04 DIAGNOSIS — F411 Generalized anxiety disorder: Secondary | ICD-10-CM | POA: Diagnosis not present

## 2024-02-04 NOTE — Progress Notes (Signed)
 [facility] Counselor/Therapist Progress Note  Patient ID: Andrea Ibarra, MRN: [id number],    Date: 02/04/2024  Time Spent: 53 minutes   Treatment Type: Individual Therapy  Reported Symptoms: anxiety, worrying, anger   Mental Status Exam:  Appearance:   Casual and Neat     Behavior:  Appropriate, Sharing, and Motivated  Motor:  Uses cane in walking  Speech/Language:   Clear and Coherent  Affect:  anxious  Mood:  anxious  Thought process:  goal directed  Thought content:    Some obsessive thoughts  Sensory/Perceptual disturbances:    WNL  Orientation:  oriented to person, place, time/date, situation, day of week, month of year, year, and stated date of Feb 04, 2024  Attention:  Fair  Concentration:  Good and Fair  Memory:  Some memory issues "short term"  Fund of knowledge:   Good  Insight:    Good and Fair  Judgment:   Good  Impulse Control:  Good   Risk Assessment: Danger to Self:  No Self-injurious Behavior: No Danger to Others: No Duty to Warn:no Physical Aggression / Violence:No  Access to Firearms a concern: No  Gang Involvement:No   Subjective:   Patient in session today focusing more on her stress, difficulty letting go of what she cannot change her control, anxiety, "things out of my control", and trying to have healthier limits for herself.  Very angry recently with family member who forgot an important event in the life of their family. Vented and shared her anger today that she has not yet expressed  to the person with whom she is angry.  Did engage in some very meaningful conversation in therapy session focusing on what it is she is really wanting to communicate to this person, and how is the best way to do what, eventually realizing that speaking out of anger is not really going to be her best choice but rather speaking directly and sharing what she has felt but also having an openness to she and the other person being able to resolve this conflict and be  able to move forward in a healthier direction, including a direction that does not harm their relationship. (Not all details included in this note due to patient privacy needs.) patient was much calmer by end of session and seemed to feel good about our arriving at a different way of her handling her anger so that it does not have to currently harm or end the relationship that is very important to her within the family.  Other family stressors continue and patient trying to manage her on anxiety and healthier ways including deep breathing exercises, taking "timeouts", and spending time with people where there is mutual love and support.  Encouraged her to continue her daily walking as she is able within her retirement center for exercise and also it puts her in social contact with other residents and less isolation in their rooms.  Did some better today and working on the practice of "letting go" as noted above but also very difficult for patient while we were working on it.  Still encouraging patient to have healthier limits with other residents where she lives in a retirement center, particularly those with whom patient seems to experience more anxiety.  Reminded of healthier limits as worked on more last session and ways to implement this in relationships and situations in a variety of environments.  Did encourage patient to continue her work on being able to catch and  interrupt anxious thoughts and replacing those thoughts with more calming and realistic thoughts.  Interventions: Cognitive Behavioral Therapy, Solution-Oriented/Positive Psychology, and Ego-Supportive  Long term goal: Reduce overall level, frequency, and intensity of the anxiety so that daily functioning is not impaired.  Short term goal: Increase understanding of beliefs and messages that lead to worry and anxiety.  Patient will share her understanding in written form or verbally in sessions, or both. Strategy: Identify, challenge, and  replace anxious or negative self-talk with positive, realistic, and empowering self-talk.  Diagnosis:   ICD-10-CM   1. Generalized anxiety disorder  F41.1      Plan:  Patient today working further on her goals and concerns regarding her anger/anxiety/worrying particularly about family relationships and personal issues.  Also needing to work on her increased stress over things that she cannot control and also her stress management and limit setting which remain a challenge for patient.  Does acknowledge that her mom had a long history of anxiety as well as other adults in the immediate family.  Needed a good amount of time today in session to focus primarily on her anger due to recent situation within family.  (Not all details included in this note due to patient privacy needs.)  Encouraged patient in her practice of more intentionally positive and self affirming/healthy behaviors as noted in sessions including: Use of prayers and meditation for support which patient has previously used with benefit, work on deep breathing exercises and self calming activities to better manage stress and feel more grounded, walking some in the hallways of her retirement home for exercise and stress relief, spending time with at least one of her friends daily who does not add to her anxiety, believe more in herself and her ability to get through difficult situations, stay in contact with people who care about her, recognize the progress she has already made, limit how much she watches disturbing world news as that is very much a stressor for her and feeds her anxiety, take occasional breaks from her electronics, focus on the things she can change or control, refrain from assuming worst-case scenarios, and recognize the strength she can show when working with goal-directed behaviors to move in a direction that supports her improved emotional health and her outlook into the future.  Andrea Ibarra has made progress and does need to  continue working on her goal-directed behaviors in order to keep moving and a more positive and healthier direction especially at this point in her life.  Goal review and progress/challenges noted with patient.  Next appointment within 4 to 6 weeks.   Kelleen Patee, LCSW

## 2024-02-05 DIAGNOSIS — N644 Mastodynia: Secondary | ICD-10-CM | POA: Diagnosis not present

## 2024-03-17 ENCOUNTER — Ambulatory Visit (INDEPENDENT_AMBULATORY_CARE_PROVIDER_SITE_OTHER): Admitting: Psychiatry

## 2024-03-25 DIAGNOSIS — B029 Zoster without complications: Secondary | ICD-10-CM | POA: Diagnosis not present

## 2024-04-01 DIAGNOSIS — B029 Zoster without complications: Secondary | ICD-10-CM | POA: Diagnosis not present

## 2024-04-07 DIAGNOSIS — B029 Zoster without complications: Secondary | ICD-10-CM | POA: Diagnosis not present

## 2024-04-09 DIAGNOSIS — M5136 Other intervertebral disc degeneration, lumbar region with discogenic back pain only: Secondary | ICD-10-CM | POA: Diagnosis not present

## 2024-04-09 DIAGNOSIS — M25551 Pain in right hip: Secondary | ICD-10-CM | POA: Diagnosis not present

## 2024-04-09 DIAGNOSIS — R600 Localized edema: Secondary | ICD-10-CM | POA: Diagnosis not present

## 2024-04-09 DIAGNOSIS — M0589 Other rheumatoid arthritis with rheumatoid factor of multiple sites: Secondary | ICD-10-CM | POA: Diagnosis not present

## 2024-04-09 DIAGNOSIS — E663 Overweight: Secondary | ICD-10-CM | POA: Diagnosis not present

## 2024-04-09 DIAGNOSIS — M1991 Primary osteoarthritis, unspecified site: Secondary | ICD-10-CM | POA: Diagnosis not present

## 2024-04-09 DIAGNOSIS — M722 Plantar fascial fibromatosis: Secondary | ICD-10-CM | POA: Diagnosis not present

## 2024-04-09 DIAGNOSIS — Z6827 Body mass index (BMI) 27.0-27.9, adult: Secondary | ICD-10-CM | POA: Diagnosis not present

## 2024-04-09 DIAGNOSIS — G4709 Other insomnia: Secondary | ICD-10-CM | POA: Diagnosis not present

## 2024-04-15 DIAGNOSIS — E559 Vitamin D deficiency, unspecified: Secondary | ICD-10-CM | POA: Diagnosis not present

## 2024-04-15 DIAGNOSIS — Z0000 Encounter for general adult medical examination without abnormal findings: Secondary | ICD-10-CM | POA: Diagnosis not present

## 2024-04-15 DIAGNOSIS — I10 Essential (primary) hypertension: Secondary | ICD-10-CM | POA: Diagnosis not present

## 2024-04-15 LAB — LAB REPORT - SCANNED: EGFR: 63

## 2024-04-16 DIAGNOSIS — H5010 Unspecified exotropia: Secondary | ICD-10-CM | POA: Diagnosis not present

## 2024-04-16 DIAGNOSIS — H52203 Unspecified astigmatism, bilateral: Secondary | ICD-10-CM | POA: Diagnosis not present

## 2024-04-16 DIAGNOSIS — Z961 Presence of intraocular lens: Secondary | ICD-10-CM | POA: Diagnosis not present

## 2024-05-08 DIAGNOSIS — H903 Sensorineural hearing loss, bilateral: Secondary | ICD-10-CM | POA: Diagnosis not present

## 2024-05-13 DIAGNOSIS — E559 Vitamin D deficiency, unspecified: Secondary | ICD-10-CM | POA: Diagnosis not present

## 2024-05-13 DIAGNOSIS — E7800 Pure hypercholesterolemia, unspecified: Secondary | ICD-10-CM | POA: Diagnosis not present

## 2024-05-13 DIAGNOSIS — I2510 Atherosclerotic heart disease of native coronary artery without angina pectoris: Secondary | ICD-10-CM | POA: Diagnosis not present

## 2024-05-13 DIAGNOSIS — I10 Essential (primary) hypertension: Secondary | ICD-10-CM | POA: Diagnosis not present

## 2024-05-13 DIAGNOSIS — Z0000 Encounter for general adult medical examination without abnormal findings: Secondary | ICD-10-CM | POA: Diagnosis not present

## 2024-07-23 DIAGNOSIS — Z6827 Body mass index (BMI) 27.0-27.9, adult: Secondary | ICD-10-CM | POA: Diagnosis not present

## 2024-07-23 DIAGNOSIS — E663 Overweight: Secondary | ICD-10-CM | POA: Diagnosis not present

## 2024-07-23 DIAGNOSIS — M0589 Other rheumatoid arthritis with rheumatoid factor of multiple sites: Secondary | ICD-10-CM | POA: Diagnosis not present

## 2024-07-23 DIAGNOSIS — M25551 Pain in right hip: Secondary | ICD-10-CM | POA: Diagnosis not present

## 2024-07-23 DIAGNOSIS — R600 Localized edema: Secondary | ICD-10-CM | POA: Diagnosis not present

## 2024-07-23 DIAGNOSIS — G4709 Other insomnia: Secondary | ICD-10-CM | POA: Diagnosis not present

## 2024-07-23 DIAGNOSIS — M722 Plantar fascial fibromatosis: Secondary | ICD-10-CM | POA: Diagnosis not present

## 2024-07-23 DIAGNOSIS — M1991 Primary osteoarthritis, unspecified site: Secondary | ICD-10-CM | POA: Diagnosis not present

## 2024-07-23 DIAGNOSIS — M5136 Other intervertebral disc degeneration, lumbar region with discogenic back pain only: Secondary | ICD-10-CM | POA: Diagnosis not present
